# Patient Record
Sex: Male | Born: 1960 | Race: White | Hispanic: No | Marital: Married | Smoking: Former smoker
Health system: Southern US, Community
[De-identification: ages and names within clinical notes are randomized; demographics above are authoritative.]

## PROBLEM LIST (undated history)

## (undated) DIAGNOSIS — M109 Gout, unspecified: Secondary | ICD-10-CM

## (undated) DIAGNOSIS — G4730 Sleep apnea, unspecified: Secondary | ICD-10-CM

## (undated) DIAGNOSIS — E669 Obesity, unspecified: Secondary | ICD-10-CM

## (undated) DIAGNOSIS — I484 Atypical atrial flutter: Secondary | ICD-10-CM

## (undated) DIAGNOSIS — I10 Essential (primary) hypertension: Secondary | ICD-10-CM

## (undated) DIAGNOSIS — K219 Gastro-esophageal reflux disease without esophagitis: Secondary | ICD-10-CM

## (undated) HISTORY — DX: Essential (primary) hypertension: I10

## (undated) HISTORY — PX: CHOLECYSTECTOMY: SHX55

## (undated) HISTORY — DX: Gastro-esophageal reflux disease without esophagitis: K21.9

## (undated) HISTORY — DX: Obesity, unspecified: E66.9

## (undated) HISTORY — DX: Atypical atrial flutter: I48.4

---

## 1999-05-08 ENCOUNTER — Emergency Department (HOSPITAL_COMMUNITY): Admission: EM | Admit: 1999-05-08 | Discharge: 1999-05-08 | Payer: Self-pay | Admitting: Emergency Medicine

## 2006-12-21 ENCOUNTER — Ambulatory Visit (HOSPITAL_COMMUNITY): Admission: RE | Admit: 2006-12-21 | Discharge: 2006-12-21 | Payer: Self-pay | Admitting: Pulmonary Disease

## 2007-07-04 ENCOUNTER — Encounter (INDEPENDENT_AMBULATORY_CARE_PROVIDER_SITE_OTHER): Payer: Self-pay | Admitting: *Deleted

## 2007-07-04 ENCOUNTER — Ambulatory Visit (HOSPITAL_COMMUNITY): Admission: RE | Admit: 2007-07-04 | Discharge: 2007-07-04 | Payer: Self-pay | Admitting: Surgery

## 2007-07-04 LAB — CONVERTED CEMR LAB
ALT: 29 units/L
AST: 16 units/L
Albumin: 4.2 g/dL
Alkaline Phosphatase: 52 units/L
BUN: 17 mg/dL
CO2: 25 meq/L
Calcium: 8.8 mg/dL
Chloride: 103 meq/L
Cholesterol: 184 mg/dL
Creatinine, Ser: 1.04 mg/dL
Free T4: 9.4 ng/dL
Glucose, Bld: 102 mg/dL
HDL: 35 mg/dL
LDL Cholesterol: 120 mg/dL
Potassium: 4.6 meq/L
Sodium: 140 meq/L
T3, Free: 146.2 pg/mL
TSH: 2.617 microintl units/mL
Total Protein: 6.9 g/dL
Triglycerides: 146 mg/dL

## 2007-07-12 ENCOUNTER — Encounter: Admission: RE | Admit: 2007-07-12 | Discharge: 2007-07-12 | Payer: Self-pay | Admitting: Surgery

## 2007-07-27 ENCOUNTER — Ambulatory Visit: Admission: RE | Admit: 2007-07-27 | Discharge: 2007-07-27 | Payer: Self-pay | Admitting: Surgery

## 2007-08-09 HISTORY — PX: LAPAROSCOPIC GASTRIC BANDING: SHX1100

## 2007-08-16 ENCOUNTER — Ambulatory Visit: Payer: Self-pay | Admitting: Pulmonary Disease

## 2007-09-26 ENCOUNTER — Encounter: Admission: RE | Admit: 2007-09-26 | Discharge: 2007-12-25 | Payer: Self-pay | Admitting: Surgery

## 2007-10-15 ENCOUNTER — Ambulatory Visit (HOSPITAL_COMMUNITY): Admission: RE | Admit: 2007-10-15 | Discharge: 2007-10-16 | Payer: Self-pay | Admitting: Surgery

## 2009-10-12 ENCOUNTER — Encounter: Payer: Self-pay | Admitting: Cardiology

## 2009-10-13 DIAGNOSIS — E663 Overweight: Secondary | ICD-10-CM | POA: Insufficient documentation

## 2009-10-13 DIAGNOSIS — I10 Essential (primary) hypertension: Secondary | ICD-10-CM | POA: Insufficient documentation

## 2009-10-15 ENCOUNTER — Encounter (INDEPENDENT_AMBULATORY_CARE_PROVIDER_SITE_OTHER): Payer: Self-pay | Admitting: *Deleted

## 2009-10-16 ENCOUNTER — Encounter (INDEPENDENT_AMBULATORY_CARE_PROVIDER_SITE_OTHER): Payer: Self-pay | Admitting: *Deleted

## 2009-10-16 ENCOUNTER — Ambulatory Visit: Payer: Self-pay | Admitting: Cardiology

## 2009-10-16 ENCOUNTER — Encounter: Payer: Self-pay | Admitting: Adult Health

## 2009-10-16 DIAGNOSIS — R079 Chest pain, unspecified: Secondary | ICD-10-CM | POA: Insufficient documentation

## 2009-10-16 DIAGNOSIS — E785 Hyperlipidemia, unspecified: Secondary | ICD-10-CM | POA: Insufficient documentation

## 2009-10-19 ENCOUNTER — Encounter: Payer: Self-pay | Admitting: Cardiology

## 2009-10-21 ENCOUNTER — Ambulatory Visit: Payer: Self-pay | Admitting: Cardiology

## 2009-10-21 ENCOUNTER — Encounter (HOSPITAL_COMMUNITY): Admission: RE | Admit: 2009-10-21 | Discharge: 2009-11-20 | Payer: Self-pay | Admitting: Cardiology

## 2009-10-21 ENCOUNTER — Encounter: Payer: Self-pay | Admitting: Cardiology

## 2009-10-26 ENCOUNTER — Ambulatory Visit: Payer: Self-pay | Admitting: Cardiology

## 2009-10-27 ENCOUNTER — Encounter: Payer: Self-pay | Admitting: Cardiology

## 2010-09-09 NOTE — Miscellaneous (Signed)
Summary: labs cmp,lipids,t4,t3,tsh,07/04/2007  Clinical Lists Changes  Observations: Added new observation of CALCIUM: 8.8 mg/dL (16/05/9603 54:09) Added new observation of ALBUMIN: 4.2 g/dL (81/19/1478 29:56) Added new observation of PROTEIN, TOT: 6.9 g/dL (21/30/8657 84:69) Added new observation of SGPT (ALT): 29 units/L (07/04/2007 15:41) Added new observation of SGOT (AST): 16 units/L (07/04/2007 15:41) Added new observation of ALK PHOS: 52 units/L (07/04/2007 15:41) Added new observation of CREATININE: 1.04 mg/dL (62/95/2841 32:44) Added new observation of BUN: 17 mg/dL (08/10/7251 66:44) Added new observation of BG RANDOM: 102 mg/dL (03/47/4259 56:38) Added new observation of CO2 PLSM/SER: 25 meq/L (07/04/2007 15:41) Added new observation of CL SERUM: 103 meq/L (07/04/2007 15:41) Added new observation of K SERUM: 4.6 meq/L (07/04/2007 15:41) Added new observation of NA: 140 meq/L (07/04/2007 15:41) Added new observation of LDL: 120 mg/dL (75/64/3329 51:88) Added new observation of HDL: 35 mg/dL (41/66/0630 16:01) Added new observation of TRIGLYC TOT: 146 mg/dL (09/32/3557 32:20) Added new observation of CHOLESTEROL: 184 mg/dL (25/42/7062 37:62) Added new observation of TSH: 2.617 microintl units/mL (07/04/2007 15:41) Added new observation of T4, FREE: 9.4 ng/dL (83/15/1761 60:73) Added new observation of T3 FREE: 146.2 pg/mL (07/04/2007 15:41)

## 2010-09-09 NOTE — Letter (Signed)
Summary: [place] Results Engineer, agricultural at [facility]  [address] [place] [address]   Phone: [phone]  Fax: [phone]      October 27, 2009 MRN: [id number]   Patrick Chapman [address] [place], [place]  [address]   Dear Mr. Sowder,  Your test ordered by Selena Batten has been reviewed by your physician (or physician assistant) and was found to be normal or stable. Your physician (or physician assistant) felt no changes were needed at this time.  ____ Echocardiogram  __X__ Cardiac Stress Test  ____ Lab Work  ____ Peripheral vascular study of arms, legs or neck  ____ CT scan or X-ray  ____ Lung or Breathing test  ____ Other: Please continue on current medical treatment.   Thank you.   Valera Castle, MD, F.A.C.C

## 2010-09-09 NOTE — Letter (Signed)
Summary: office notes  office notes   Imported By: Faythe Ghee 10/12/2009 12:10:39  _____________________________________________________________________  External Attachment:    Type:   Image     Comment:   External Document

## 2010-09-09 NOTE — Letter (Signed)
Summary:  Treadmill (Nuc Med Stress)  [facility]  [address] [place] [address]   Phone: [phone]  Fax: [phone]    Nuclear Medicine 1-Day Stress Test Information Sheet  Re:     Patrick Chapman   DOB:     1960-10-12 MRN:     [id number] Weight:  Appointment Date: Register at: Appointment Time: Referring MD:  _X__Exercise Stress  __Adenosine   __Dobutamine  __Lexiscan  __Persantine   __Thallium  Urgency: ____1 (next day)   ____2 (one week)    ____3 (PRN)  Patient will receive Follow Up call with results: Patient needs follow-up appointment:  Instructions regarding medication:  How to prepare for your stress test: 1. DO NOT eat or dring 6 hours prior to your arrival time. This includes no caffeine (coffee, tea, sodas, chocolate) if you were instructed to take your medications, drink water with it. 2. DO NOT use any tobacco products for at leaset 8 hours prior to arrival. 3. DO NOT wear dresses or any clothing that may have metal clasps or buttons. 4. Wear short sleeve shirts, loose clothing, and comfortalbe walking shoes. 5. DO NOT use lotions, oils or powder on your chest before the test. 6. The test will take approximately 3-4 hours from the time you arrive until completion. 7. To register the day of the test, go to the Short Stay entrance at [facility]. 8. If you must cancel your test, call [phone] as soon as you are aware.  After you arrive for test:   When you arrive at [facility], you will go to Short Stay to be registered. They will then send you to Radiology to check in. The Nuclear Medicine Tech will get you and start an IV in your arm or hand. A small amount of a radioactive tracer will then be injected into your IV. This tracer will then have to circulate for 30-45 minutes. During this time you will wait in the waiting room and you will be able to drink something without caffeine. A series of pictures will be taken  of your heart follwoing this waiting period. After the 1st set of pictures you will go to the stress lab to get ready for your stress test. During the stress test, another small amount of a radioactive tracer will be injected through your IV. When the stress test is complete, there is a short rest period while your heart rate and blood pressure will be monitored. When this monitoring period is complete you will have another set of pictrues taken. (The same as the 1st set of pictures). These pictures are taken between 15 minutes and 1 hour after the stress test. The time depends on the type of stress test you had. Your doctor will inform you of your test results within 7 days after test.    The possibilities of certain changes are possible during the test. They include abnormal blood pressure and disorders of the heart. Side effects of persantine or adenosine can include flushing, chest pain, shortness of breath, stomach tightness, headache and light-headedness. These side effects usually do not last long and are self-resolving. Every effort will be made to keep you comfortable and to minimize complications by obtaining a medical history and by close observation during the test. Emergency equipment, medications, and trained personnel are available to deal with any unusual situation which may arise.  Please notify office at least 48 hours in advance if you are unable to keep  this appt.

## 2010-09-09 NOTE — Letter (Signed)
Summary: EKG  EKG   Imported By: Faythe Ghee 10/19/2009 13:20:30  _____________________________________________________________________  External Attachment:    Type:   Image     Comment:   External Document

## 2010-09-09 NOTE — Assessment & Plan Note (Signed)
Summary: f/u myoview to be done 3/16/tg   Visit Type:  Follow-up Primary Provider:  Nehemiah Settle  CC:  No Cardiology Complaints today.  History of Present Illness: Patrick Chapman is a 50 y/o obese CM with known history of hypertension, hypercholesterolemia, lap-band implantation 2009 with 95lb weight loss, with complaints of chest discomfort.  On last visit he was sent for evaluation with a stress nuclear studybecause of these symptoms described as heartburn and pressure. The results of the stress test, completed 10/21/2009 revealed negative with impaired exercise capacity, normal LV size and function with normal myocardial perfusion without convincing evidence for ischemia or infarction.  The patient states that the symptoms are improved since being seen last.  He states that his lap band was recently tightened and he thinks that this is the source of his heartburn and pressure.  He says that the last time the lap band was tightened, he had the same symptoms, but forgot about them until now.  Current Medications (verified): 1)  Azor 5-40 Mg Tabs (Amlodipine-Olmesartan) .... Take 1 Tab Daily 2)  Aspir-Low 81 Mg Tbec (Aspirin) .... Take 1 Tab Daily 3)  Simvastatin 40 Mg Tabs (Simvastatin) .... Take One Tablet By Mouth Daily At Bedtime  Allergies (verified): 1)  ! Codeine  Review of Systems       All other systems have been reviewed and are negative unless stated above.   Vital Signs:  Patient profile:   50 year old male Weight:      301 pounds Pulse rate:   72 / minute BP sitting:   110 / 75  (right arm)  Vitals Entered By: Dreama Saa, CNA (October 26, 2009 3:20 PM) 01  Physical Exam  General:  Well developed, well nourished, in no acute distress. Lungs:  Clear bilaterally to auscultation and percussion. Heart:  Non-displaced PMI, chest non-tender; regular rate and rhythm, S1, S2 without murmurs, rubs or gallops. Carotid upstroke normal, no bruit. Normal abdominal aortic  size, no bruits. Femorals normal pulses, no bruits. Pedals normal pulses. No edema, no varicosities.   Impression & Recommendations:  Problem # 1:  CHEST PAIN UNSPECIFIED (ICD-786.50) Stress test is reassuring and seemed to relieve the patient of concerns about cardiac etiology of pain. It allowed him to remember about the symptoms that he experienced the last time his lap band was tightened.  I have encouraged him to continued his weight loss regimine.  Also to increase his exercise to help to develop more stamina.  We will see him on a as needed basis. His updated medication list for this problem includes:    Azor 5-40 Mg Tabs (Amlodipine-olmesartan) .Marland Kitchen... Take 1 tab daily    Aspir-low 81 Mg Tbec (Aspirin) .Marland Kitchen... Take 1 tab daily  Patient Instructions: 1)  Your physician recommends that you schedule a follow-up appointment in: as needed  2)  Your physician recommends that you continue on your current medications as directed. Please refer to the Current Medication list given to you today.

## 2010-09-09 NOTE — Assessment & Plan Note (Signed)
Summary: **NP3 CHEST PAIN   Visit Type:  Initial Consult Primary Provider:  Nehemiah Settle  CC:  chest pain.  History of Present Illness: Patrick Chapman is a 50 y/o obese CM with known history of hypertension, lap-band implantation in 2009, with 90 lb weight loss, palpatations since childhood. Who has been experiening "heart burn" and sharp left sided chest pain for about 6wks to 2 months without exertion.  He had been taking Nexium before lap band procedure, but since that time had no recurrence of heartburn until recently.  He was seen by his primary care physician Dr. Juanetta Gosling and after reporting these symptoms, he was referred to see cardiology. He denies SOB, diaphoresis, NV, syncope  with heartburn or palplations.  Preventive Screening-Counseling & Management  Alcohol-Tobacco     Alcohol drinks/day: 0     Smoking Status: quit > 6 months     Smoking Cessation Counseling: no  Caffeine-Diet-Exercise     Does Patient Exercise: no     Exercise Counseling: Yes  Problems Prior to Update: 1)  Hypertension  (ICD-401.9) 2)  Overweight  (ICD-278.02)  Current Medications (verified): 1)  Azor 5-40 Mg Tabs (Amlodipine-Olmesartan) .... Take 1 Tab Daily 2)  Aspir-Low 81 Mg Tbec (Aspirin) .... Take 1 Tab Daily 3)  Simvastatin 40 Mg Tabs (Simvastatin) .... Take One Tablet By Mouth Daily At Bedtime  Allergies (verified): 1)  ! Codeine  Past History:  Past Surgical History: laparoscopic adjustable gastric banding 10/15/2007 cholecystectomy  Family History: Father: CABG age 57 Mother: CAD with 2 stents age 31's Siblings: Good health  Social History: Tobacco Use - No.  Alcohol Use - no Regular Exercise - no Drug Use - no Full Time IT consultant Married  Alcohol drinks/day:  0 Smoking Status:  quit > 6 months  Review of Systems       Heartburn, palpatations.  All other systems have been reviewed and are negative unless stated above.   Vital Signs:  Patient profile:    50 year old male Height:      72 inches Weight:      305 pounds BMI:     41.51 Pulse rate:   86 / minute BP sitting:   127 / 84  (right arm)  Vitals Entered By: Dreama Saa, CNA (October 16, 2009 1:31 PM)  Physical Exam  General:  Well developed, well nourished, in no acute distress. Head:  normocephalic and atraumatic Eyes:  PERRLA/EOM intact; conjunctiva and lids normal. Ears:  TM's intact and clear with normal canals and hearing Nose:  no deformity, discharge, inflammation, or lesions Mouth:  Teeth, gums and palate normal. Oral mucosa normal. Neck:  obese Lungs:  Clear bilaterally to auscultation and percussion. Heart:  Non-displaced PMI, chest non-tender; regular rate and rhythm, S1, S2 without murmurs, rubs or gallops. Carotid upstroke normal, no bruit. Normal abdominal aortic size, no bruits. Femorals normal pulses, no bruits. Pedals normal pulses. No edema, no varicosities. Abdomen:  Obese 2+ bowel sounds Msk:  Back normal, normal gait. Muscle strength and tone normal. Extremities:  No clubbing or cyanosis. Neurologic:  Alert and oriented x 3. Psych:  Normal affect.   Impression & Recommendations:  Problem # 1:  HYPERLIPIDEMIA (ICD-272.4) Review of his recent lipid profile.  TC 200;HDL 37; LDL 142;  TG 151  We will start simvistatin 40mg  daily.  He is encouraged to walk or exercise daily and to continue his weight loss regimine. His updated medication list for this problem includes:  Simvastatin 40 Mg Tabs (Simvastatin) .Marland Kitchen... Take one tablet by mouth daily at bedtime  Future Orders: T-Lipid Profile ([id number]) ... 11/27/2009 T-Renal Function Panel [id number]) ... 11/27/2009  Problem # 2:  CHEST PAIN UNSPECIFIED (ICD-786.50) Will schedule tests below.  He has CVRF for CAD, wt, HTN, Cholesterol, and FH.  This will assess for diagnostic and prognostic evaluation of heart disease. He has also been seen by Dr. Juanito Doom for introduction and discussion of risk factors and  need for further testing. His updated medication list for this problem includes:    Azor 5-40 Mg Tabs (Amlodipine-olmesartan) .Marland Kitchen... Take 1 tab daily    Aspir-low 81 Mg Tbec (Aspirin) .Marland Kitchen... Take 1 tab daily  Orders: Nuclear Stress Test (Nuc Stress Test) 2-D Echocardiogram (2D Echo)  Problem # 3:  HYPERTENSION (ICD-401.9) Assessment: Unchanged  His updated medication list for this problem includes:    Azor 5-40 Mg Tabs (Amlodipine-olmesartan) .Marland Kitchen... Take 1 tab daily    Aspir-low 81 Mg Tbec (Aspirin) .Marland Kitchen... Take 1 tab daily  Future Orders: T-Lipid Profile ([id number]) ... 11/27/2009 T-Renal Function Panel [id number]) ... 11/27/2009  Patient Instructions: 1)  Your physician recommends that you schedule a follow-up appointment in: after testing 2)  Your physician recommends that you return for lab work in: 6 weeks 3)  Your physician has requested that you have an echocardiogram.  Echocardiography is a painless test that uses sound waves to create images of your heart. It provides your doctor with information about the size and shape of your heart and how well your heart's chambers and valves are working.  This procedure takes approximately one hour. There are no restrictions for this procedure. 4)  Your physician has requested that you have an exercise stress myoview.  For further information please visit [email or web address]  Please follow instruction sheet, as given. 5)  Your physician has recommended you make the following change in your medication: START SIMVASTATIN 40MG  DAILY Prescriptions: SIMVASTATIN 40 MG TABS (SIMVASTATIN) Take one tablet by mouth daily at bedtime  #30 x 6   Entered by:   Teressa Lower RN   Authorized by:   Joni Reining, NP   Signed by:   Teressa Lower RN on 10/16/2009   Method used:   Electronically to        [facility] (retail)       [address]       [place], [place]  [address]       Ph: [phone] or [phone]        Fax: [phone]   RxID:   [id number]

## 2010-09-09 NOTE — Letter (Signed)
Summary: labs  labs   Imported By: Faythe Ghee 10/19/2009 13:42:57  _____________________________________________________________________  External Attachment:    Type:   Image     Comment:   External Document

## 2010-09-09 NOTE — Letter (Signed)
Summary: [place] [employer] [facility] Work Engineer, agricultural at [facility]  [address] [place] [address]   Phone: [phone]  Fax: [phone]     October 16, 2009 MRN: [id number]   [facility] [address] [place], [place]  [address]      YOUR LAB WORK IS DUE   _______APRIL 22, 2011__________________________________  Please go to [facility], located across the street from [facility] on the second floor.  Hours are Monday - Friday 7am until 7:30pm         Saturday 8am until 12noon    _X_  DO NOT EAT OR DRINK AFTER MIDNIGHT EVENING PRIOR TO LABWORK  __ YOUR LABWORK IS NOT FASTING --YOU MAY EAT PRIOR TO LABWORK

## 2010-09-09 NOTE — Letter (Signed)
Summary: PROGRESS NOTES  PROGRESS NOTES   Imported By: Faythe Ghee 10/19/2009 13:21:06  _____________________________________________________________________  External Attachment:    Type:   Image     Comment:   External Document

## 2010-09-10 NOTE — Letter (Signed)
Summary: ekg  ekg   Imported By: Faythe Ghee 10/12/2009 12:11:03  _____________________________________________________________________  External Attachment:    Type:   Image     Comment:   External Document

## 2010-11-19 ENCOUNTER — Emergency Department (HOSPITAL_COMMUNITY): Payer: BC Managed Care – PPO

## 2010-11-19 ENCOUNTER — Emergency Department (HOSPITAL_COMMUNITY)
Admission: EM | Admit: 2010-11-19 | Discharge: 2010-11-20 | Disposition: A | Discharge status: Home / Self Care | Payer: BC Managed Care – PPO | Attending: Emergency Medicine | Admitting: Emergency Medicine

## 2010-11-19 DIAGNOSIS — R110 Nausea: Secondary | ICD-10-CM | POA: Insufficient documentation

## 2010-11-19 DIAGNOSIS — Z79899 Other long term (current) drug therapy: Secondary | ICD-10-CM | POA: Insufficient documentation

## 2010-11-19 DIAGNOSIS — R079 Chest pain, unspecified: Secondary | ICD-10-CM | POA: Insufficient documentation

## 2010-11-19 DIAGNOSIS — R0602 Shortness of breath: Secondary | ICD-10-CM | POA: Insufficient documentation

## 2010-11-19 DIAGNOSIS — Z9889 Other specified postprocedural states: Secondary | ICD-10-CM | POA: Insufficient documentation

## 2010-11-19 DIAGNOSIS — I10 Essential (primary) hypertension: Secondary | ICD-10-CM | POA: Insufficient documentation

## 2010-11-19 DIAGNOSIS — E669 Obesity, unspecified: Secondary | ICD-10-CM | POA: Insufficient documentation

## 2010-11-19 DIAGNOSIS — R0989 Other specified symptoms and signs involving the circulatory and respiratory systems: Secondary | ICD-10-CM | POA: Insufficient documentation

## 2010-11-19 DIAGNOSIS — R0609 Other forms of dyspnea: Secondary | ICD-10-CM | POA: Insufficient documentation

## 2010-11-19 LAB — CBC
HCT: 46.1 % (ref 39.0–52.0)
Hemoglobin: 15.3 g/dL (ref 13.0–17.0)
MCH: 31.2 pg (ref 26.0–34.0)
MCHC: 33.2 g/dL (ref 30.0–36.0)
MCV: 93.9 fL (ref 78.0–100.0)
Platelets: 201 10*3/uL (ref 150–400)
RBC: 4.91 MIL/uL (ref 4.22–5.81)
RDW: 13.4 % (ref 11.5–15.5)
WBC: 8.9 10*3/uL (ref 4.0–10.5)

## 2010-11-19 LAB — COMPREHENSIVE METABOLIC PANEL
ALT: 19 U/L (ref 0–53)
AST: 17 U/L (ref 0–37)
Albumin: 4.2 g/dL (ref 3.5–5.2)
Alkaline Phosphatase: 49 U/L (ref 39–117)
BUN: 13 mg/dL (ref 6–23)
CO2: 28 mEq/L (ref 19–32)
Calcium: 9.6 mg/dL (ref 8.4–10.5)
Chloride: 104 mEq/L (ref 96–112)
Creatinine, Ser: 1.29 mg/dL (ref 0.4–1.5)
GFR calc Af Amer: >60 mL/min (ref >60)
GFR calc non Af Amer: 59 mL/min — ABNORMAL LOW (ref >60)
Glucose, Bld: 106 mg/dL — ABNORMAL HIGH (ref 70–99)
Potassium: 4.5 mEq/L (ref 3.5–5.1)
Sodium: 139 mEq/L (ref 135–145)
Total Bilirubin: 0.8 mg/dL (ref 0.3–1.2)
Total Protein: 7.1 g/dL (ref 6.0–8.3)

## 2010-11-19 LAB — DIFFERENTIAL
Basophils Absolute: 0 10*3/uL (ref 0.0–0.1)
Basophils Relative: 0 % (ref 0–1)
Eosinophils Absolute: 0.1 10*3/uL (ref 0.0–0.7)
Eosinophils Relative: 1 % (ref 0–5)
Lymphocytes Relative: 23 % (ref 12–46)
Lymphs Abs: 2.1 10*3/uL (ref 0.7–4.0)
Monocytes Absolute: 0.8 10*3/uL (ref 0.1–1.0)
Monocytes Relative: 9 % (ref 3–12)
Neutro Abs: 6 10*3/uL (ref 1.7–7.7)
Neutrophils Relative %: 67 % (ref 43–77)

## 2010-11-19 LAB — LIPASE, BLOOD: Lipase: 30 U/L (ref 11–59)

## 2010-11-19 LAB — POCT CARDIAC MARKERS
CKMB, poc: <1 ng/mL — ABNORMAL LOW (ref 1.0–8.0)
CKMB, poc: <1 ng/mL — ABNORMAL LOW (ref 1.0–8.0)
Myoglobin, poc: 41.9 ng/mL (ref 12–200)
Myoglobin, poc: 44.4 ng/mL (ref 12–200)
Troponin i, poc: <0.05 ng/mL (ref 0.00–0.09)
Troponin i, poc: <0.05 ng/mL (ref 0.00–0.09)

## 2010-11-25 ENCOUNTER — Encounter: Payer: Self-pay | Admitting: Gastroenterology

## 2010-11-25 ENCOUNTER — Ambulatory Visit (INDEPENDENT_AMBULATORY_CARE_PROVIDER_SITE_OTHER): Payer: BC Managed Care – PPO | Admitting: Gastroenterology

## 2010-11-25 ENCOUNTER — Other Ambulatory Visit: Payer: Self-pay | Admitting: Internal Medicine

## 2010-11-25 DIAGNOSIS — R1013 Epigastric pain: Secondary | ICD-10-CM | POA: Insufficient documentation

## 2010-11-25 DIAGNOSIS — K219 Gastro-esophageal reflux disease without esophagitis: Secondary | ICD-10-CM | POA: Insufficient documentation

## 2010-11-25 DIAGNOSIS — R0789 Other chest pain: Secondary | ICD-10-CM

## 2010-11-25 NOTE — Progress Notes (Signed)
Referring Provider: Fredirick Maudlin, MD Primary Care Physician:  Patrick Maudlin, MD Primary Gastroenterologist:  Dr. Jena Chapman   Chief Complaint  Patient presents with  . Gastrophageal Reflux    possible, and lap band checked, frequent chest pain    HPI:  Patrick Chapman is a 50 y.o. male here as a referral from Dr. Juanetta Chapman for reflux exacerbation and left-sided chest discomfort, hx of band placement. Patrick Chapman reports undergoing a laparoscopic adjustable gastric band, AP large in March 2009 by Dr. Daphine Chapman in [place]. His pre-operative wt was 370. He is now down to 304. His estimated max fill is 9cc; he is unsure exactly how much is in his band curently. He does report that 1cc was removed last week by Dr. Daphine Chapman due to increased reflux. This has not provided any relief. Pt reports last fill prior to this removal was approximately 1 year ago, 0.25cc. He had done well up until a few months ago. Distinctly remembers eating a spicy chicken sandwich from [employer], whereby he then experienced severe left-sided chest pains. He has had a cardiac work-up in the past. He continues to experience intermittent reflux, stabbing/sharp left-sided chest discomfort, and LUQ pain with severe episodes. This is not always related to food intake. Coffee worsens the discomfort. Nexium has relieved this discomfort slightly. He has upped the nexium to BID for the past 4 days. He continues to experience these symptoms despite BID PPI and removal of 1cc of fluid last week. Occasional nausea, no fever or chills. As of note, he has gained approximately 15 lbs since Thanksgiving due to eating habits. He does have a hx of reflux prior to lap band placement; however, he had resolution of reflux post-surgery for approximately 2+ years.  He has not had any radiologic procedures such as an UGI/Ba swallow; no prior endoscopy.    Past Medical History  Diagnosis Date  . GERD (gastroesophageal reflux disease)   . Hypertension      Past Surgical History  Procedure Date  . Laparoscopic gastric banding 2009    Dr. Daphine Chapman  . Cholecystectomy     Current Outpatient Prescriptions  Medication Sig Dispense Refill  . AZOR 5-40 MG per tablet Take 1 tablet by mouth daily.      Marland Kitchen NEXIUM 40 MG capsule Take 1 tablet by mouth Twice daily.        Allergies as of 11/25/2010 - Review Complete 11/25/2010  Allergen Reaction Noted  . Codeine  10/16/2009    Family History  Problem Relation Age of Onset  . Heart attack Mother     living  . Heart attack Father     living  . Diabetes Father     History   Social History  . Marital Status: Married    Spouse Name: N/A    Number of Children: N/A  . Years of Education: N/A   Social History Main Topics  . Smoking status: Never Smoker   . Smokeless tobacco: Current User    Types: Chew  . Alcohol Use: Yes     occassional  . Drug Use: No  . Sexually Active: Yes -- Male partner(s)    Birth Control/ Protection: Surgical     spouse    Review of Systems: Gen: Denies any fever, chills, sweats, anorexia, fatigue, weakness, malaise, weight loss, and sleep disorder CV: Denies chest pain, angina, palpitations, syncope, orthopnea, PND, peripheral edema, and claudication. Resp: Denies dyspnea at rest, dyspnea with exercise, cough, sputum, wheezing, coughing up blood,  and pleurisy. GI: See HPI GU : Denies urinary burning, blood in urine, urinary frequency, urinary hesitancy, nocturnal urination, and urinary incontinence. MS: Denies joint pain, limitation of movement, and swelling, stiffness, low back pain, extremity pain. Denies muscle weakness, cramps, atrophy.  Derm: Denies rash, itching, dry skin, hives, moles, warts, or unhealing ulcers.  Psych: Denies depression, anxiety, memory loss, suicidal ideation, hallucinations, paranoia, and confusion. Heme: Denies bruising, bleeding, and enlarged lymph nodes.  Physical Exam: BP 123/81  Pulse 78  Temp 97.9 F (36.6 C)  Ht  6' (1.829 m)  Wt 304 lb 3.2 oz (137.984 kg)  BMI 41.26 kg/m2 General:   Alert,  Well-developed, well-nourished, pleasant and cooperative in NAD Head:  Normocephalic and atraumatic. Eyes:  Sclera clear, no icterus.   Conjunctiva pink. Ears:  Normal auditory acuity. Nose:  No deformity, discharge,  or lesions. Mouth:  No deformity or lesions, dentition normal. Neck:  Supple; no masses or thyromegaly. Lungs:  Clear throughout to auscultation.   No wheezes, crackles, or rhonchi. No acute distress. Heart:  Regular rate and rhythm; no murmurs, clicks, rubs,  or gallops. Abdomen:  Soft, nontender and nondistended. No masses, hepatosplenomegaly or hernias noted. Normal bowel sounds, without guarding, and without rebound.  Port easily palpable in LLQ Msk:  Symmetrical without gross deformities. Normal posture. Extremities:  Without clubbing or edema. Neurologic:  Alert and  oriented x4;  grossly normal neurologically. Skin:  Intact without significant lesions or rashes. Psych:  Alert and cooperative. Normal mood and affect.

## 2010-11-25 NOTE — Assessment & Plan Note (Signed)
See GERD 

## 2010-11-25 NOTE — Progress Notes (Signed)
Cc to PCP 

## 2010-11-25 NOTE — Patient Instructions (Signed)
Switch to Dexilant instead of Nexium. Take this once a day in the morning. Make sure you activate the savings card first.  We have set you up for an Upper GI swallow. This will assess your esophagus, position of band, and stomach. We will contact you with those results when received.  Our next step would be to pursue a possible endoscopy. This will be discussed after review of the Upper GI.   Continue to eat small portions, chewing well, eating slowly. Further recommendations to follow.

## 2010-11-25 NOTE — Assessment & Plan Note (Signed)
50 year old male with hx of reflux in past, resolved after lap band surgery in 2009, now with recurrence in the setting of slight weight gain over past few months. However, pt's last lap band fill was approximately one year ago, and he had done well with this until a few months ago. Despite 1 cc aspiration one week ago, pt continues to experience reflux exacerbations and left-sided chest discomfort. This is concerning for possible band slippage due to acute onset. Other differentials include overfilled band, increased weight contributing to overall picture. Left-sided chest discomfort and LUQ pain concerning for slippage, erosion, possible gastritis/PUD. Doubt this is a true picture of band slippage or erosion, but unable to be ruled out at this time without further investigation.  Will proceed with UGI/Ba swallow to assess band position Stop Nexium. Trial of Dexilant. Samples provided Will likely need upper endoscopy in the very near future. Will review results of UGI first.  Further recommendations to follow.

## 2010-12-01 ENCOUNTER — Ambulatory Visit (HOSPITAL_COMMUNITY)
Admission: RE | Admit: 2010-12-01 | Discharge: 2010-12-01 | Disposition: A | Discharge status: Home / Self Care | Payer: BC Managed Care – PPO | Source: Ambulatory Visit | Attending: Internal Medicine | Admitting: Internal Medicine

## 2010-12-01 DIAGNOSIS — K219 Gastro-esophageal reflux disease without esophagitis: Secondary | ICD-10-CM

## 2010-12-01 DIAGNOSIS — Z9884 Bariatric surgery status: Secondary | ICD-10-CM | POA: Insufficient documentation

## 2010-12-01 DIAGNOSIS — R0789 Other chest pain: Secondary | ICD-10-CM

## 2010-12-01 DIAGNOSIS — R079 Chest pain, unspecified: Secondary | ICD-10-CM | POA: Insufficient documentation

## 2010-12-03 ENCOUNTER — Telehealth: Payer: Self-pay | Admitting: Gastroenterology

## 2010-12-06 NOTE — Telephone Encounter (Signed)
May have Dexilant 60 mg po daily. Disp# 31 with 5 refills to pharmacy. Glad he is doing better. Let's see him back in 6 weeks.

## 2010-12-06 NOTE — Telephone Encounter (Signed)
rx called in to [facility] pharmacy. Please schedule follow up appt.

## 2010-12-07 NOTE — Telephone Encounter (Signed)
Pt is aware of OV on 01/12/11 at 3pm with AS

## 2010-12-08 ENCOUNTER — Encounter: Payer: Self-pay | Admitting: Gastroenterology

## 2010-12-21 NOTE — Procedures (Signed)
Patrick Chapman, Patrick Chapman NO.:  [id number]   MEDICAL RECORD NO.:  [id number]          PATIENT TYPE:  OUT   LOCATION:  SLEEP LAB                     FACILITY:  [facility]   PHYSICIAN:  Barbaraann Share, MD,FCCPDATE OF BIRTH:  1962-02-15   DATE OF STUDY:  07/27/2007                            NOCTURNAL POLYSOMNOGRAM   REFERRING PHYSICIAN:  Thornton Park. Daphine Deutscher, MD   INDICATION FOR STUDY:  Hypersomnia with sleep apnea.   Epworth score:  2.  Sleep architecture:  The patient had total sleep time of 286 minutes  with decreased slow-wave sleep as well as REM.  Sleep onset latency was  prolonged at 65 minutes, and REM onset was fairly normal at 92 minutes.  Sleep efficiency was decreased at 73%.  Respiratory data:  The patient  was found to have 3 obstructive apneas and 39 obstructive hypopneas for  an apnea/hypopnea index of 9 events per hour.  The events occurred  primarily during supine REM.  There was loud snoring noted throughout.  Oxygen data:  There was O2 desaturation as low as 73% with the patient's  obstructive events.  Cardiac:  Rare PVCs were noted but no clinically significant arrhythmia.  Movement/parasomnia:  None.   IMPRESSION/RECOMMENDATIONS:  Mild obstructive sleep apnea/hypopnea  syndrome with an apnea/hypopnea index of 9 events per hour and O2  desaturation as low as 73%.   TREATMENT:  treatment for this degree of sleep apnea can include weight  loss alone, if applicable, upper airway surgery, oral appliance, and  also CPAP.      Barbaraann Share, MD,FCCP  Diplomate, American Board of Sleep  Medicine  Electronically Signed     KMC/MEDQ  D:  08/16/2007 16:03:14  T:  08/17/2007 07:34:10  Job:  [id number]

## 2010-12-21 NOTE — Op Note (Signed)
NAMEKENTON, [name]                ACCOUNT NO.:  [id number]   MEDICAL RECORD NO.:  [id number]          PATIENT TYPE:  OIB   LOCATION:  1533                         FACILITY:  [facility]   PHYSICIAN:  Thornton Park. Daphine Deutscher, MD  DATE OF BIRTH:  1961/02/23   DATE OF PROCEDURE:  10/15/2007  DATE OF DISCHARGE:                               OPERATIVE REPORT   PREOPERATIVE DIAGNOSIS:  Morbid obesity with hypertension and a body  mass index of 47.   POSTOPERATIVE DIAGNOSIS:  Morbid obesity with hypertension and a body  mass index of 47.   PROCEDURE:  Laparoscopic adjustable gastric banding (Allergan) with APL  system.   SURGEON:  Thornton Park. Daphine Deutscher, M.D.   ASSISTANT:  Sandria Bales. Ezzard Standing, M.D.   ANESTHESIA:  General endotracheal.   DESCRIPTION OF PROCEDURE:  Patrick Chapman is a 50 year old white male who  was taken to room 1 on October 15, 2007 and given general anesthesia.  The  abdomen was prepped with Techni-Care and draped sterilely.   The abdomen was entered through the left upper quadrant using an  Optiview technique 0-degree scope, and once entered, the abdomen was  insufflated without difficulty.  Standard trocar placements were used,  including a 15 in the right upper position, using the band passer from  the lower position on the right.  I went ahead and put the [facility]  retractor up, retracting the liver and dissected the EG junction.  There  was a little bleeder along the gastrohepatic ligament that I had to clip  with two clips.  I went down to the fat stripe along the right crus and  used that as my marker to enter with the finger and passed it up to the  patient's left side without difficulty.  The APL band was inserted and  threaded through the band passer and brought around without difficulty  and snapped in place over the calibration tubing.  Prior to that time,  with the calibration and tubing in place, we inflated the balloon to 15  mL, pulled it back, and did not encounter  evidence of a hiatal hernia.   The anterior portion of the band was then plicated with 3 sutures using  free sutures of Surgitek and the tie knot.  Good band position was  noted.  The tubing was then brought out through the lower port on the  right, and the abdomen was deflated.  I then created a bigger port on  the right.  I did have some bleeding from the upper 15 trocar which I  cauterized and controlled and then fashioned the port site, exposing the  fascia and clearing that off.  Four sutures of 2-0 Prolene were then  placed through the fascia and then threaded onto the port which was  connected to the tubing, and then this was tied down.  I irrigated the  port well, removing all the excess fat and then closed each of the holes  with 4-0 Vicryl with Dermabond.   The patient tolerated the procedure well and was taken to the recovery  room in satisfactory condition.  Status post placement of APL system.      Thornton Park Daphine Deutscher, MD  Electronically Signed     MBM/MEDQ  D:  10/15/2007  T:  10/16/2007  Job:  [id number]   cc:   Ramon Dredge L. Juanetta Gosling, M.D.  Fax: [phone]

## 2011-01-12 ENCOUNTER — Encounter: Payer: Self-pay | Admitting: Gastroenterology

## 2011-01-12 ENCOUNTER — Ambulatory Visit (INDEPENDENT_AMBULATORY_CARE_PROVIDER_SITE_OTHER): Payer: BC Managed Care – PPO | Admitting: Gastroenterology

## 2011-01-12 VITALS — BP 135/83 | HR 76 | Temp 98.3°F | Ht 72.0 in | Wt 305.0 lb

## 2011-01-12 DIAGNOSIS — R1012 Left upper quadrant pain: Secondary | ICD-10-CM

## 2011-01-12 NOTE — Progress Notes (Signed)
Referring Provider: Fredirick Maudlin, MD Primary Care Physician:  Fredirick Maudlin, MD Primary Gastroenterologist: Dr. Jena Gauss   Chief Complaint  Patient presents with  . Follow-up    HPI:   [name] is a 50 year old obese Caucasian male who presents today in f/u for LUQ pain, left-sided chest discomfort, GERD. He is s/p lap band by Dr. Daphine Deutscher in [place] in 2009. According to pt, he ate large amounts of food prior to the operation, and after surgery he lost what he had gained; however, he remains pretty much at the same weight he has been for years. He initially was seen by myself for complaints of reflux, and left-sided chest discomfort. Dr. Daphine Deutscher had actually removed 1 cc of fluid from the band in early April, but the patient did not have resolution of symptoms. It should be noted that he has a hx of prior reflux before band placement, but it resolved with wt loss.   Now, with reflux exacerbation and left-sided chest discomfort, LUQ pain, I had been concerned about possible band slippage. An UGI was performed and reviewed by myself and the radiologist. Although the films were not ideal, the band appeared to be at the appropriate 45 degree angle, at the level of the GE junction, similar to prior studies.  He noticed some improvement with Dexilant that I prescribed at his last visit. Yet, he returns now with constant LUQ pain, radiating up to left chest, almost like a muscle burning. Feels slightly improved with eating, then returns 30 minutes later. He states red onions, ketchup, spicy foods exacerbated the pain. He doesn't necessarily complain of indigestion/heartburn/reflux.  He continues to take Dexilant daily, chewing Tums multiple times a day. Last endoscopy long time ago, pt unsure of date. He hasn't seen Dr. Daphine Deutscher since last cc was removed in early April. Portion sizes are not appropriate. Sometimes eats a regular plate size of food. Sometimes he admits to "stacking" food.   Past Medical  History  Diagnosis Date  . GERD (gastroesophageal reflux disease)   . Hypertension   . Obesity     Past Surgical History  Procedure Date  . Laparoscopic gastric banding 2009    Dr. Daphine Deutscher  . Cholecystectomy     Current Outpatient Prescriptions  Medication Sig Dispense Refill  . AZOR 5-40 MG per tablet Take 1 tablet by mouth daily.      Marland Kitchen DEXILANT 60 MG capsule 60 mg daily.       Marland Kitchen NEXIUM 40 MG capsule Take 1 tablet by mouth Twice daily.      . simvastatin (ZOCOR) 40 MG tablet Take 40 mg by mouth at bedtime.         Allergies as of 01/12/2011 - Review Complete 01/12/2011  Allergen Reaction Noted  . Codeine  10/16/2009    Family History  Problem Relation Age of Onset  . Heart attack Mother     living  . Heart attack Father     living  . Diabetes Father     History   Social History  . Marital Status: Married    Spouse Name: N/A    Number of Children: N/A  . Years of Education: N/A   Social History Main Topics  . Smoking status: Never Smoker   . Smokeless tobacco: Current User    Types: Chew  . Alcohol Use: Yes     occassional  . Drug Use: No  . Sexually Active: Yes -- Male partner(s)    Birth Control/ Protection:  Surgical     spouse     Review of Systems: Gen: Denies fever, chills, anorexia. Denies fatigue, weakness, weight loss.  CV: Denies chest pain, palpitations, syncope, peripheral edema, and claudication. Complains of left-sided chest discomfort that radiates from LUQ.  Resp: Denies dyspnea at rest, cough, wheezing, coughing up blood, and pleurisy. GI: Denies vomiting blood, jaundice, and fecal incontinence.   Denies dysphagia or odynophagia. Derm: Denies rash, itching, dry skin Psych: Denies depression, anxiety, memory loss, confusion. No homicidal or suicidal ideation.  Heme: Denies bruising, bleeding, and enlarged lymph nodes.  Physical Exam: BP 135/83  Pulse 76  Temp(Src) 98.3 F (36.8 C) (Temporal)  Ht 6' (1.829 m)  Wt 305 lb (138.347  kg)  BMI 41.37 kg/m2 General:   Alert and oriented. No distress noted. Pleasant and cooperative.  Head:  Normocephalic and atraumatic. Eyes:  Conjuctiva clear without scleral icterus. Mouth:  Oral mucosa pink and moist. Good dentition. No lesions. Neck:  Supple, without mass or thyromegaly. Heart:  S1, S2 present without murmurs, rubs, or gallops. Regular rate and rhythm. Abdomen:  Obese with large AP diameter. Surgical scars noted, band port easily palpable in middle left quadrant. +BS, soft, non-tender and non-distended. No rebound or guarding. No HSM or masses noted. Msk:  Symmetrical without gross deformities. Normal posture. Extremities:  Without edema. Neurologic:  Alert and  oriented x4;  grossly normal neurologically. Skin:  Intact without significant lesions or rashes. Cervical Nodes:  No significant cervical adenopathy. Psych:  Alert and cooperative. Normal mood and affect.

## 2011-01-12 NOTE — Patient Instructions (Signed)
We have set you up for an endoscopy.  Continue to take Dexilant daily.  We will contact your surgeon so they are aware of what is going on.  Further recommendations to follow.

## 2011-01-14 DIAGNOSIS — R1012 Left upper quadrant pain: Secondary | ICD-10-CM | POA: Insufficient documentation

## 2011-01-14 NOTE — Assessment & Plan Note (Signed)
50 year old Caucasian male with hx of band placement in 2009 by Dr. Daphine Deutscher with [facility] in [place]. Prior to operation had hx of reflux, yet resolved with wt loss. Now, has gradually noticed increasing reflux without improvement of removal of 1cc of fluid. He was started on Dexilant at last visit, as well as underwent an UGI to assess band placement. Dexilant appeared to alleviate symptoms for awhile, yet now reports LUQ pain radiating to left chest. Exacerbated by spicy foods, red onions. His symptoms are relieved by eating, yet return shortly thereafter. His UGI showed appropriate placement of band.  At this time, his continued LUQ pain despite PPI deserves assessment endoscopically to assess for gastritis, ulcer or even band erosion. I feel there is a component of non-compliance with dietary choices as well as portion sizes, which most definitely needs to be addressed.  I spoke in detail to pt about "stacking" food or "force-feeding", as this is not appropriate s/p band surgery. I briefly broached the subject of possible band removal, as he is not complying with dietary recommendations at this time or losing weight, as well as experiencing worsening of reflux. He is approaching the max capacity of his band, although this is completely at Dr. Ermalene Searing discretion.   With his co-morbidities, he may be better served with removal of band and conversion to gastric bypass. Again, this is at the surgeon's discretion, and he will be cc'ed on this note. The pt certainly wants to achieve relief, and although not impossible, he may not be able to lose any further weight without drastic dietary and behavior changes.   We will proceed with EGD with Dr. Jena Gauss in the near future. The R/B/A have been discussed in detail with the pt. He states understanding and agrees to proceed. Continue Dexilant Modify diet, avoid trigger foods Will contact Dr. Daphine Deutscher; pt may need additional fluid removal to see if this  helps alleviate some symptoms. I have strongly advised pt to return for f/u.

## 2011-01-17 NOTE — Progress Notes (Signed)
Cc to PCP 

## 2011-02-11 MED ORDER — SODIUM CHLORIDE 0.45 % IV SOLN
Freq: Once | INTRAVENOUS | Status: DC
  Filled 2011-02-11: qty 1000

## 2011-02-14 ENCOUNTER — Encounter (HOSPITAL_COMMUNITY): Admission: RE | Payer: Self-pay | Source: Ambulatory Visit

## 2011-02-14 ENCOUNTER — Encounter: Payer: BC Managed Care – PPO | Admitting: Internal Medicine

## 2011-02-14 ENCOUNTER — Ambulatory Visit (HOSPITAL_COMMUNITY)
Admission: RE | Admit: 2011-02-14 | Payer: BC Managed Care – PPO | Source: Ambulatory Visit | Admitting: Internal Medicine

## 2011-02-14 SURGERY — EGD (ESOPHAGOGASTRODUODENOSCOPY)
Anesthesia: Moderate Sedation

## 2011-02-22 NOTE — Telephone Encounter (Signed)
Opened in error

## 2011-02-25 ENCOUNTER — Encounter (HOSPITAL_COMMUNITY): Payer: Self-pay | Admitting: Internal Medicine

## 2011-05-02 LAB — CBC
HCT: 38.2 — ABNORMAL LOW
Hemoglobin: 13.2
MCHC: 34.5
MCV: 89.7
Platelets: 195
RBC: 4.25
RDW: 14.1
WBC: 6.7

## 2011-05-02 LAB — BASIC METABOLIC PANEL
BUN: 18
CO2: 27
Calcium: 9.3
Chloride: 99
Creatinine, Ser: 1.06
GFR calc Af Amer: >60
GFR calc non Af Amer: >60
Glucose, Bld: 89
Potassium: 4.2
Sodium: 136

## 2011-05-02 LAB — DIFFERENTIAL
Basophils Absolute: 0
Basophils Relative: 0
Eosinophils Absolute: 0
Eosinophils Relative: 1
Lymphocytes Relative: 32
Lymphs Abs: 2.1
Monocytes Absolute: 0.7
Monocytes Relative: 10
Neutro Abs: 3.8
Neutrophils Relative %: 57

## 2011-05-02 LAB — HEMOGLOBIN AND HEMATOCRIT, BLOOD
HCT: 43.5
Hemoglobin: 14.8

## 2011-06-15 MED ORDER — DEXLANSOPRAZOLE 60 MG PO CPDR: 60.0000 mg | DELAYED_RELEASE_CAPSULE | Freq: Every day | ORAL | Status: DC

## 2012-05-07 ENCOUNTER — Encounter: Payer: Self-pay | Admitting: Gastroenterology

## 2012-05-07 ENCOUNTER — Ambulatory Visit (INDEPENDENT_AMBULATORY_CARE_PROVIDER_SITE_OTHER): Payer: BC Managed Care – PPO | Admitting: Gastroenterology

## 2012-05-07 VITALS — BP 140/84 | HR 85 | Temp 98.4°F | Ht 72.0 in | Wt 317.2 lb

## 2012-05-07 DIAGNOSIS — K219 Gastro-esophageal reflux disease without esophagitis: Secondary | ICD-10-CM

## 2012-05-07 NOTE — Progress Notes (Signed)
Referring Provider: Fredirick Maudlin, MD Primary Care Physician:  Fredirick Maudlin, MD Primary Gastroenterologist: Dr. Jena Gauss   Chief Complaint  Patient presents with  . Gastrophageal Reflux    HPI:   51 year old pleasant male with hx of obesity, s/p lap band surgery in 2009 by Dr. Daphine Deutscher in [place]. I have seen this nice gentleman in the past, with last visit in June 2012. I had recommended an EGD at that time due to chest discomfort, reflux despite PPI. I was concerned about possible band slippage, erosion less likely. Barium swallow performed at that time showed good positioning of the band. He was set up for an EGD, but for unknown reasons this was not completed. I had spent some time with him discussing proper eating habits after the band surgery, and the risk of worsening reflux after band placement. He continues to gain weight. His highest weight prior to surgery was 370, with a low of 280. He is now up to 317.   States he started a new job March 2013 and began drinking massive amounts of tea and coffee to stay awake. Noted worsening chest pain, reflux. Started on Dexilant BID with improvement in symptoms. Symptoms have now resolved.   No nausea. No dysphagia. Does note occasional upper abdominal pain up through chest. Cardiac work up in past  negative. Been on BID for about 2 weeks now. Hasn't seen Dr. Daphine Deutscher recently. One month of symptoms. Not sure how much fluid is in band. Portion sizes about the same for several years.    Past Medical History  Diagnosis Date  . GERD (gastroesophageal reflux disease)   . Hypertension   . Obesity     Past Surgical History  Procedure Date  . Laparoscopic gastric banding 2009    Dr. Daphine Deutscher  . Cholecystectomy   . Esophagogastroduodenoscopy 02/14/2011    DID NOT COMPLETE, was scheduled but for unknown reasons was not undertaken    Current Outpatient Prescriptions  Medication Sig Dispense Refill  . AZOR 5-40 MG per tablet Take 1 tablet by  mouth daily.      Marland Kitchen dexlansoprazole (DEXILANT) 60 MG capsule Take 60 mg by mouth 2 (two) times daily.        Allergies as of 05/07/2012 - Review Complete 05/07/2012  Allergen Reaction Noted  . Codeine  10/16/2009    Family History  Problem Relation Age of Onset  . Heart attack Mother     living  . Heart attack Father     living  . Diabetes Father   . Colon cancer Neg Hx     History   Social History  . Marital Status: Married    Spouse Name: N/A    Number of Children: N/A  . Years of Education: N/A   Social History Main Topics  . Smoking status: Never Smoker   . Smokeless tobacco: Current User    Types: Chew  . Alcohol Use: Yes     occassional  . Drug Use: No  . Sexually Active: Yes -- Male partner(s)    Birth Control/ Protection: Surgical     spouse   Other Topics Concern  . None   Social History Narrative  . None    Review of Systems: Gen: Denies fever, chills, anorexia. Denies fatigue, weakness, weight loss.  CV: Denies chest pain, palpitations, syncope, peripheral edema, and claudication. Resp: Denies dyspnea at rest, cough, wheezing, coughing up blood, and pleurisy. GI: Denies vomiting blood, jaundice, and fecal incontinence.   Denies dysphagia or  odynophagia. Derm: Denies rash, itching, dry skin Psych: Denies depression, anxiety, memory loss, confusion. No homicidal or suicidal ideation.  Heme: Denies bruising, bleeding, and enlarged lymph nodes.  Physical Exam: BP 140/84  Pulse 85  Temp 98.4 F (36.9 C) (Temporal)  Ht 6' (1.829 m)  Wt 317 lb 3.2 oz (143.881 kg)  BMI 43.02 kg/m2 General:   Alert and oriented. No distress noted. Pleasant and cooperative.  Head:  Normocephalic and atraumatic. Eyes:  Conjuctiva clear without scleral icterus. Mouth:  Oral mucosa pink and moist. Good dentition. No lesions. Heart:  S1, S2 present without murmurs, rubs, or gallops. Regular rate and rhythm. Abdomen:  +BS, soft, non-tender and non-distended. No rebound  or guarding. No HSM or masses noted. Port easily palpable left-sided abdomen.  Msk:  Symmetrical without gross deformities. Normal posture. Extremities:  Without edema. Neurologic:  Alert and  oriented x4;  grossly normal neurologically. Skin:  Intact without significant lesions or rashes. Psych:  Alert and cooperative. Normal mood and affect.

## 2012-05-07 NOTE — Patient Instructions (Addendum)
Decrease Dexilant to once daily.   Call me in 1 week to let me know how you are doing. We may need to do an upper endoscopy.  You will need a colonoscopy at some point this year. If an upper endoscopy is needed, it can be done at the same time.  Next appointment to be determined after next week.

## 2012-05-09 ENCOUNTER — Encounter: Payer: Self-pay | Admitting: Gastroenterology

## 2012-05-09 NOTE — Assessment & Plan Note (Signed)
51 year old male with hx of lap band surgery in 2009, now with worsening of GERD symptoms. Please see the HPI for further details. Barium swallow last year showed adequate band placement. Has not followed up with Dr. Daphine Deutscher recently. Has recently been ingesting large amounts of tea, coffee to stay awake at new job. However, with cessation of this, as not mentioned above, symptoms have improved. He is on Dexilant BID. Continues to have intermittent upper abdominal discomfort, radiating up to chest. Negative cardiac work-up in past.  I continue to offer and recommend an upper endoscopy due to refractory reflux symptoms, upper abdominal discomfort and chest discomfort. Although the band procedure can make reflux worse, this would not entirely explain his symptoms. He has also gained a considerable amount of weight back, worsening his symptoms as well. Dexilant BID has worked well for him. Differentials at this point include band too tight, reflux secondary to behavior and weight gain, reflux symptoms as a result of the band itself, and more concerning possible band erosion.  I have asked him to decrease Dexilant to once a day, avoid reflux triggers, wt loss. He is to call me in 1 week. At this point, he is not thrilled about pursuing upper GI evaluation. I have discussed with him the importance of evaluation, and he would like to wait an additional week. At this time, he will decide on the EGD. At that time, I have offered him an initial, average risk screening colonoscopy. He turns 51 the middle of October.   Pt agreed and stated understanding. I have asked that he also follow-up with Dr. Daphine Deutscher in the near future.

## 2012-05-10 NOTE — Progress Notes (Signed)
Faxed to PCP

## 2012-05-14 ENCOUNTER — Telehealth: Payer: Self-pay | Admitting: *Deleted

## 2012-05-14 NOTE — Telephone Encounter (Signed)
Patrick Chapman called today on the request of [name]. He says that he is doing great, having no problems at all. He would like [name] to call him back, he does have a few questions for her. Thanks.

## 2012-05-14 NOTE — Telephone Encounter (Signed)
I called and spoke with Patrick Chapman. He said he is doing real good now. He said [name] had discussed him having a colonoscopy since he will turn 5 in October. He wants to wait til winter and do it. York Spaniel he will call when he is ready. I have put him on my reminder list.

## 2012-05-15 NOTE — Telephone Encounter (Signed)
Sounds great. Thanks

## 2012-06-04 ENCOUNTER — Telehealth: Payer: Self-pay

## 2012-06-04 NOTE — Telephone Encounter (Signed)
Pt called- he stated he has been having a lot of "indigestion" and abd pain. No N/V. Pt stated he is still taking dexilant qd but when he was taking it bid he wasn't having any problems at all. Informed him per AS ov note that she may want him to have egd and tcs done. Pt said he was fine with this. Do you want pt to come in for ov prior?   Also he is concerned about cost of procedures. He has to pay 20 percent of the procedure cost and wanted to know if we could give him an estimate on the cost of the procedures.

## 2012-06-05 NOTE — Telephone Encounter (Signed)
Dr. Jena Gauss, pts last ov appt was 05/07/12, can pt be triaged or do we need to bring him back in for ov?

## 2012-06-05 NOTE — Telephone Encounter (Signed)
Doris, please triage pt. Thanks.

## 2012-06-05 NOTE — Telephone Encounter (Signed)
You can triaged only if we get the procedure done by the end of next week (November 8); otherwise he needs an office visit.

## 2012-06-05 NOTE — Telephone Encounter (Signed)
If RMR is ok with just triaging, let's triage. He does need TCS/EGD.  Reason: refractory reflux despite daily PPI, hx of lap band, initial average risk screening colonoscopy.

## 2012-06-07 ENCOUNTER — Other Ambulatory Visit: Payer: Self-pay

## 2012-06-07 ENCOUNTER — Telehealth: Payer: Self-pay

## 2012-06-07 DIAGNOSIS — Z139 Encounter for screening, unspecified: Secondary | ICD-10-CM

## 2012-06-07 DIAGNOSIS — K219 Gastro-esophageal reflux disease without esophagitis: Secondary | ICD-10-CM

## 2012-06-07 MED ORDER — PEG-KCL-NACL-NASULF-NA ASC-C 100 G PO SOLR: 1.0000 | ORAL | Status: DC

## 2012-06-07 NOTE — Telephone Encounter (Signed)
See separate triage.  

## 2012-06-07 NOTE — Telephone Encounter (Signed)
  Gastroenterology Pre-Procedure Form \ OK TO TRIAGE PER RMR    Request Date: 06/07/2012    Requesting Physician: Dr. Jena Gauss     PATIENT INFORMATION:  Patrick Chapman is a 51 y.o., male (DOB=June 22, 1961).  PROCEDURE: Procedure(s) requested: colonoscopy Procedure Reason: screening for colon cancer/ EGD fir gerd and hx of lap band  PATIENT REVIEW QUESTIONS: The patient reports the following:   1. Diabetes Melitis: no 2. Joint replacements in the past 12 months: no 3. Major health problems in the past 3 months: no 4. Has an artificial valve or MVP:no 5. Has been advised in past to take antibiotics in advance of a procedure like teeth cleaning: no}    MEDICATIONS & ALLERGIES:    Patient reports the following regarding taking any blood thinners:   Plavix? no Aspirin?yes  Coumadin?  no  Patient confirms/reports the following medications:  Current Outpatient Prescriptions  Medication Sig Dispense Refill  . aspirin 81 MG tablet Take 81 mg by mouth daily.      . AZOR 5-40 MG per tablet Take 1 tablet by mouth daily.      Marland Kitchen dexlansoprazole (DEXILANT) 60 MG capsule Take 60 mg by mouth daily.         Patient confirms/reports the following allergies:  Allergies  Allergen Reactions  . Codeine Nausea And Vomiting and Rash    Patient is appropriate to schedule for requested procedure(s): yes  AUTHORIZATION INFORMATION Primary Insurance:   ID #:   Group #:  Pre-Cert / Auth required:  Pre-Cert / Auth #:   Secondary Insurance:   ID #:   Group #:  Pre-Cert / Auth required:  Pre-Cert / Auth #:   No orders of the defined types were placed in this encounter.    SCHEDULE INFORMATION: Procedure has been scheduled as follows:  Date: 06/12/2012      Time: 3:00 PM  Location: [facility] Short Stay  This Gastroenterology Pre-Precedure Form is being routed to the following provider(s) for review: R. Roetta Sessions, MD

## 2012-06-07 NOTE — Telephone Encounter (Signed)
Rx sent to [facility] pharmacy and pt coming by to pick up instructions.

## 2012-06-07 NOTE — Telephone Encounter (Signed)
Yes, pt is appropriate for screening TCS and EGD. EGD is being performed secondary to refractory reflux despite daily PPI (Dexilant). Also notable is hx of lap band. My last note covers this in detail. Thanks!

## 2012-06-12 ENCOUNTER — Encounter (HOSPITAL_COMMUNITY): Payer: Self-pay | Admitting: *Deleted

## 2012-06-12 ENCOUNTER — Ambulatory Visit (HOSPITAL_COMMUNITY)
Admission: RE | Admit: 2012-06-12 | Discharge: 2012-06-12 | Disposition: A | Discharge status: Home / Self Care | Payer: BC Managed Care – PPO | Source: Ambulatory Visit | Attending: Internal Medicine | Admitting: Internal Medicine

## 2012-06-12 ENCOUNTER — Encounter (HOSPITAL_COMMUNITY)
Admission: RE | Disposition: A | Discharge status: Home / Self Care | Payer: Self-pay | Source: Ambulatory Visit | Attending: Internal Medicine

## 2012-06-12 DIAGNOSIS — K219 Gastro-esophageal reflux disease without esophagitis: Secondary | ICD-10-CM

## 2012-06-12 DIAGNOSIS — Z1211 Encounter for screening for malignant neoplasm of colon: Secondary | ICD-10-CM

## 2012-06-12 DIAGNOSIS — I10 Essential (primary) hypertension: Secondary | ICD-10-CM | POA: Insufficient documentation

## 2012-06-12 DIAGNOSIS — R079 Chest pain, unspecified: Secondary | ICD-10-CM

## 2012-06-12 DIAGNOSIS — R198 Other specified symptoms and signs involving the digestive system and abdomen: Secondary | ICD-10-CM

## 2012-06-12 DIAGNOSIS — D126 Benign neoplasm of colon, unspecified: Secondary | ICD-10-CM | POA: Insufficient documentation

## 2012-06-12 DIAGNOSIS — Z139 Encounter for screening, unspecified: Secondary | ICD-10-CM

## 2012-06-12 HISTORY — PX: COLONOSCOPY WITH ESOPHAGOGASTRODUODENOSCOPY (EGD): SHX5779

## 2012-06-12 SURGERY — COLONOSCOPY WITH ESOPHAGOGASTRODUODENOSCOPY (EGD)
Anesthesia: Moderate Sedation

## 2012-06-12 MED ORDER — BUTAMBEN-TETRACAINE-BENZOCAINE 2-2-14 % EX AERO
INHALATION_SPRAY | CUTANEOUS | Status: DC | PRN
  Administered 2012-06-12: 2 via TOPICAL

## 2012-06-12 MED ORDER — MEPERIDINE HCL 100 MG/ML IJ SOLN
INTRAMUSCULAR | Status: AC
  Filled 2012-06-12: qty 1

## 2012-06-12 MED ORDER — MIDAZOLAM HCL 5 MG/5ML IJ SOLN
INTRAMUSCULAR | Status: DC | PRN
  Administered 2012-06-12: 2 mg via INTRAVENOUS
  Administered 2012-06-12: 1 mg via INTRAVENOUS
  Administered 2012-06-12 (×3): 2 mg via INTRAVENOUS

## 2012-06-12 MED ORDER — SODIUM CHLORIDE 0.45 % IV SOLN: INTRAVENOUS | Status: DC

## 2012-06-12 MED ORDER — MIDAZOLAM HCL 5 MG/5ML IJ SOLN
INTRAMUSCULAR | Status: AC
  Filled 2012-06-12: qty 10

## 2012-06-12 MED ORDER — MEPERIDINE HCL 100 MG/ML IJ SOLN
INTRAMUSCULAR | Status: DC | PRN
  Administered 2012-06-12 (×4): 25 mg via INTRAVENOUS
  Administered 2012-06-12: 50 mg via INTRAVENOUS

## 2012-06-12 MED ORDER — STERILE WATER FOR IRRIGATION IR SOLN
Status: DC | PRN
  Administered 2012-06-12: 14:00:00

## 2012-06-12 NOTE — Op Note (Signed)
[facility] [address] [place], [address]   ENDOSCOPY PROCEDURE REPORT  PATIENT: Patrick Chapman, Patrick Chapman.  MR#: [id number] BIRTHDATE: [birth year] , 51  yrs. old GENDER: Male ENDOSCOPIST: R.  Roetta Sessions, MD FACP FACG REFERRED BY:  Kari Baars, M.D. PROCEDURE DATE:  06/12/2012 PROCEDURE:     Diagnostic EGD  INDICATIONS:     chest pain and reflux symptoms improved on twice a day Dexilant; history of lap band procedure  INFORMED CONSENT:   The risks, benefits, limitations, alternatives and imponderables have been discussed.  The potential for biopsy, esophogeal dilation, etc. have also been reviewed.  Questions have been answered.  All parties agreeable.  Please see the history and physical in the medical record for more information.  MEDICATIONS:    Versed 8 mg IV and Demerol 125 mg IV in divided doses. Cetacaine spray.  DESCRIPTION OF PROCEDURE:   The Pentax Gastroscope [id number] endoscope was introduced through the mouth and advanced to the second portion of the duodenum without difficulty or limitations. The mucosal surfaces were surveyed very carefully during advancement of the scope and upon withdrawal.  Retroflexion view of the proximal stomach and esophagogastric junction was performed.      FINDINGS: Normal-appearing esophageal mucosa. Patent esophagus throughout its course.  Gastric cavity empty.  Extrinsic mass effect at the GE junction consistent with the lap band. Normal gastric mucosa. Patent pylorus. Normal first and second portion of the duodenum.  THERAPEUTIC / DIAGNOSTIC MANEUVERS PERFORMED:  None   COMPLICATIONS:  None  IMPRESSION:  Status post prior lap band placement. Normal esophageal, gastric and duodenal mucosa through the second portion  RECOMMENDATIONS:  Weight loss recommended. Increase Dexilant 60 mg orally twice daily.  See colonoscopy report.    _______________________________ R. Roetta Sessions, MD FACP [facility] eSigned:   R. Roetta Sessions, MD FACP [facility] 06/12/2012 2:22 PM     CC:  PATIENT NAME:  Patrick Chapman, Patrick Chapman. MR#: [id number]

## 2012-06-12 NOTE — Op Note (Addendum)
[facility] [address] [place], [address]   COLONOSCOPY PROCEDURE REPORT  PATIENT: Patrick Chapman, [name].  MR#:         [id number] BIRTHDATE: 08-20-1960 , 51  yrs. old GENDER: Male ENDOSCOPIST: R.  Roetta Sessions, MD FACP [facility] REFERRED BY:   Dr. Juanetta Gosling PROCEDURE DATE:  06/12/2012 PROCEDURE:     Colonoscopy biopsy and snare polypectomy  INDICATIONS: Average risk colorectal cancer screening  INFORMED CONSENT:  The risks, benefits, alternatives and imponderables including but not limited to bleeding, perforation as well as the possibility of a missed lesion have been reviewed.  The potential for biopsy, lesion removal, etc. have also been discussed.  Questions have been answered.  All parties agreeable. Please see the history and physical in the medical record for more information.  MEDICATIONS: Versed 9 mg IV Demerol 150 mg IV in divided doses.  DESCRIPTION OF PROCEDURE:  After a digital rectal exam was performed, the [id number] ([id number]) and Pentax Colonoscope [id number] colonoscope was advanced from the anus through the rectum and colon to the area of the cecum, ileocecal valve and appendiceal orifice. The cecum was deeply intubated.  These structures were well-seen and photographed for the record.  From the level of the cecum and ileocecal valve, the scope was slowly and cautiously withdrawn. The mucosal surfaces were carefully surveyed utilizing scope tip deflection to facilitate fold flattening as needed.  The scope was pulled down into the rectum where a thorough examination including retroflexion was performed.    FINDINGS:  Adequate preparation. Normal rectum. Patient had a 6 millimeter polyp at the splenic flexure; there was a second polyp ,diminutive in size, in the base of the cecum; otherwise, the remainder of colonic mucosa appeared normal.  THERAPEUTIC / DIAGNOSTIC MANEUVERS PERFORMED:  D2 above-mentioned polyps were hot snare and cold biopsied  removed, respectively.  COMPLICATIONS: None  CECAL WITHDRAWAL TIME:  10 minutes  IMPRESSION:  Colonic polyps-removed as described above  RECOMMENDATIONS: Followup on pathology. See EGD report.  Will pursue a right upper quadrant ultrasound to further evaluate his upper abdominal pain.   _______________________________ eSigned:  R. Roetta Sessions, MD FACP [facility] 06/12/2012 2:55 PM   CC:

## 2012-06-12 NOTE — H&P (Signed)
Chief Complaint   Patient presents with   .  Gastrophageal Reflux    HPI:  51 year old pleasant male with hx of obesity, s/p lap band surgery in 2009 by Dr. Daphine Deutscher in [place]. I have seen this nice gentleman in the past, with last visit in June 2012. I had recommended an EGD at that time due to chest discomfort, reflux despite PPI. I was concerned about possible band slippage, erosion less likely. Barium swallow performed at that time showed good positioning of the band. He was set up for an EGD, but for unknown reasons this was not completed. I had spent some time with him discussing proper eating habits after the band surgery, and the risk of worsening reflux after band placement. He continues to gain weight. His highest weight prior to surgery was 370, with a low of 280. He is now up to 317.  States he started a new job March 2013 and began drinking massive amounts of tea and coffee to stay awake. Noted worsening chest pain, reflux. Started on Dexilant BID with improvement in symptoms. Symptoms have now resolved.  No nausea. No dysphagia. Does note occasional upper abdominal pain up through chest. Cardiac work up in past negative. Been on BID for about 2 weeks now. Hasn't seen Dr. Daphine Deutscher recently. One month of symptoms. Not sure how much fluid is in band. Portion sizes about the same for several years.  Past Medical History   Diagnosis  Date   .  GERD (gastroesophageal reflux disease)    .  Hypertension    .  Obesity     Past Surgical History   Procedure  Date   .  Laparoscopic gastric banding  2009     Dr. Daphine Deutscher   .  Cholecystectomy    .  Esophagogastroduodenoscopy  02/14/2011     DID NOT COMPLETE, was scheduled but for unknown reasons was not undertaken    Current Outpatient Prescriptions   Medication  Sig  Dispense  Refill   .  AZOR 5-40 MG per tablet  Take 1 tablet by mouth daily.     Marland Kitchen  dexlansoprazole (DEXILANT) 60 MG capsule  Take 60 mg by mouth 2 (two) times daily.       Allergies as of 05/07/2012 - Review Complete 05/07/2012   Allergen  Reaction  Noted   .  Codeine   10/16/2009    Family History   Problem  Relation  Age of Onset   .  Heart attack  Mother       living    .  Heart attack  Father       living    .  Diabetes  Father    .  Colon cancer  Neg Hx     History    Social History   .  Marital Status:  Married     Spouse Name:  N/A     Number of Children:  N/A   .  Years of Education:  N/A    Social History Main Topics   .  Smoking status:  Never Smoker   .  Smokeless tobacco:  Current User     Types:  Chew   .  Alcohol Use:  Yes      occassional   .  Drug Use:  No   .  Sexually Active:  Yes -- Male partner(s)     Birth Control/ Protection:  Surgical      spouse    Other Topics  Concern   .  None    Social History Narrative   .  None    Review of Systems:  Gen: Denies fever, chills, anorexia. Denies fatigue, weakness, weight loss.  CV: Denies chest pain, palpitations, syncope, peripheral edema, and claudication.  Resp: Denies dyspnea at rest, cough, wheezing, coughing up blood, and pleurisy.  GI: Denies vomiting blood, jaundice, and fecal incontinence. Denies dysphagia or odynophagia.  Derm: Denies rash, itching, dry skin  Psych: Denies depression, anxiety, memory loss, confusion. No homicidal or suicidal ideation.  Heme: Denies bruising, bleeding, and enlarged lymph nodes.  Physical Exam:   General: Alert and oriented. No distress noted. Pleasant and cooperative.  Head: Normocephalic and atraumatic.  Eyes: Conjuctiva clear without scleral icterus.  Mouth: Oral mucosa pink and moist. Good dentition. No lesions.  Heart: S1, S2 present without murmurs, rubs, or gallops. Regular rate and rhythm.  Abdomen: +BS, soft, non-tender and non-distended. No rebound or guarding. No HSM or masses noted. Port easily palpable left-sided abdomen.  Msk: Symmetrical without gross deformities. Normal posture.  Extremities: Without  edema.  Neurologic: Alert and oriented x4; grossly normal neurologically.  Skin: Intact without significant lesions or rashes.  Psych: Alert and cooperative. Normal mood and affect.  GERD (gastroesophageal reflux disease)  51 year old male with hx of lap band surgery in 2009, now with worsening of GERD symptoms. Please see the HPI for further details. Barium swallow last year showed adequate band placement. Has not followed up with Dr. Daphine Deutscher recently. Has recently been ingesting large amounts of tea, coffee to stay awake at new job. However, with cessation of this, as not mentioned above, symptoms have improved. He is on Dexilant BID. Continues to have intermittent upper abdominal discomfort, radiating up to chest. Negative cardiac work-up in past.  I continue to offer and recommend an upper endoscopy due to refractory reflux symptoms, upper abdominal discomfort and chest discomfort. Although the band procedure can make reflux worse, this would not entirely explain his symptoms. He has also gained a considerable amount of weight back, worsening his symptoms as well. Dexilant BID has worked well for him. Differentials at this point include band too tight, reflux secondary to behavior and weight gain, reflux symptoms as a result of the band itself, and more concerning possible band erosion.  I have asked him to decrease Dexilant to once a day, avoid reflux triggers, wt loss. He is to call me in 1 week. At this point, he is not thrilled about pursuing upper GI evaluation. I have discussed with him the importance of evaluation, and he would like to wait an additional week. At this time, he will decide on the EGD. At that time, I have offered him an initial, average risk screening colonoscopy. He turns 51 the middle of October.  Pt agreed and stated understanding.  I have asked that he also follow-up with Dr. Daphine Deutscher in the near future.   Patient drop back on Dexilant once daily and have her current symptoms.  He went back to twice a day in symptoms evidence of down.

## 2012-06-14 ENCOUNTER — Telehealth (INDEPENDENT_AMBULATORY_CARE_PROVIDER_SITE_OTHER): Payer: Self-pay

## 2012-06-14 ENCOUNTER — Telehealth: Payer: Self-pay | Admitting: Internal Medicine

## 2012-06-14 ENCOUNTER — Telehealth (INDEPENDENT_AMBULATORY_CARE_PROVIDER_SITE_OTHER): Payer: Self-pay | Admitting: General Surgery

## 2012-06-14 NOTE — Telephone Encounter (Signed)
Pt called stating he has been seeing Dr Kendell [facility] and had endo with work up for reflux. Pt offered appt with Dr Daphine Deutscher but pt requests Dr Daphine Deutscher review records in epic and if he needs appt he will make one at that time. Pt states he was advised to f/u with Dr Daphine Deutscher but pt wants to know if he should have any testing or xrays other than what has already been done.

## 2012-06-14 NOTE — Telephone Encounter (Signed)
Pt LMOM that he needed to speak with RMR. He doesn't remember clearly what was said after procedure. He said it was regarding RMR saying his lap band was loose and he was concerned about that. Please call patient at [phone]

## 2012-06-14 NOTE — Telephone Encounter (Signed)
Had AS take a look at pts procedure note and pictures. There was no erosion in his lap band and pt needed to follow up with Dr. Daphine Deutscher and maybe have the lap band loosened. Pt is aware and he will call Dr. Daphine Deutscher and schedule a follow up visit with him.

## 2012-06-14 NOTE — Telephone Encounter (Signed)
Spoke with pt and informed him that I had Dr. Daphine Deutscher review his chart.  Dr. Daphine Deutscher believes he might be a little too tight and that we may need to remove some fluid from his band.  I informed him that I had an opening with our [facility] 11/14 at 8:30.  He said he would take this.

## 2012-06-14 NOTE — Telephone Encounter (Signed)
Routing to AS for review. 

## 2012-06-15 ENCOUNTER — Telehealth: Payer: Self-pay

## 2012-06-15 NOTE — Telephone Encounter (Signed)
Opened in error

## 2012-06-17 ENCOUNTER — Encounter: Payer: Self-pay | Admitting: Internal Medicine

## 2012-06-18 ENCOUNTER — Encounter (HOSPITAL_COMMUNITY): Payer: Self-pay | Admitting: Internal Medicine

## 2012-06-19 ENCOUNTER — Encounter: Payer: Self-pay | Admitting: *Deleted

## 2012-06-19 NOTE — Telephone Encounter (Signed)
Agree. Reviewed pictures with Raynelle Fanning.

## 2012-06-21 ENCOUNTER — Ambulatory Visit (INDEPENDENT_AMBULATORY_CARE_PROVIDER_SITE_OTHER): Payer: BC Managed Care – PPO | Admitting: Physician Assistant

## 2012-06-21 ENCOUNTER — Encounter (INDEPENDENT_AMBULATORY_CARE_PROVIDER_SITE_OTHER): Payer: Self-pay

## 2012-06-21 DIAGNOSIS — Z9884 Bariatric surgery status: Secondary | ICD-10-CM

## 2012-06-21 NOTE — Progress Notes (Signed)
  HISTORY: Patrick Chapman is a 51 y.o.male who received an AP-Large lap-band in March 2009 by Dr. Daphine Deutscher. He comes in with complaints of ongoing GERD. He's been seen by GI and has received both an upper endoscopy and colonoscopy. The results were reviewed and appear normal. He's been on Dexilant which has helped but his primary complaint today is his intolerance of things like coffee and onions. They produce severe upper abdominal discomfort. Other foods tend to be well-tolerated. He also had issues with diarrhea prior to taking Dexilant but since starting it, he's had no further problems. He denies persistent regurgitation.  VITAL SIGNS: Filed Vitals:   06/21/12 0838  BP: 138/82  Pulse: 78  Temp: 97.6 F (36.4 C)  Resp: 18    PHYSICAL EXAM: Physical exam reveals a very well-appearing 51 y.o.male in no apparent distress Neurologic: Awake, alert, oriented Psych: Bright affect, conversant Respiratory: Breathing even and unlabored. No stridor or wheezing Extremities: Atraumatic, good range of motion. Skin: Warm, Dry, no rashes Musculoskeletal: Normal gait, Joints normal  ASSESMENT: 51 y.o.  male  s/p AP-Large lap-band.   PLAN: I'm ordering a KUB to check band placement. I asked him to continue his dexilant. I'm not certain that there's a surgical explanation for his intolerance of particular foods, especially given a normal EGD. We'll follow-up on the results and see him again in three months unless his symptoms change.

## 2012-06-21 NOTE — Patient Instructions (Signed)
Return in 3 months. Focus on good food choices as well as physical activity. Return sooner if you have an increase in hunger, portion sizes or weight. Return also for difficulty swallowing, night cough, reflux. 

## 2012-08-15 NOTE — Progress Notes (Signed)
Pt not seen by me

## 2012-09-20 ENCOUNTER — Encounter (INDEPENDENT_AMBULATORY_CARE_PROVIDER_SITE_OTHER): Payer: BC Managed Care – PPO

## 2012-09-27 ENCOUNTER — Encounter (INDEPENDENT_AMBULATORY_CARE_PROVIDER_SITE_OTHER): Payer: BC Managed Care – PPO

## 2013-12-25 ENCOUNTER — Other Ambulatory Visit (HOSPITAL_COMMUNITY): Payer: Self-pay | Admitting: Pulmonary Disease

## 2013-12-25 DIAGNOSIS — M542 Cervicalgia: Secondary | ICD-10-CM

## 2013-12-31 ENCOUNTER — Ambulatory Visit
Admission: RE | Admit: 2013-12-31 | Discharge: 2013-12-31 | Disposition: A | Discharge status: Home / Self Care | Payer: BC Managed Care – PPO | Source: Ambulatory Visit | Attending: Pulmonary Disease | Admitting: Pulmonary Disease

## 2013-12-31 ENCOUNTER — Other Ambulatory Visit: Payer: Self-pay | Admitting: Pulmonary Disease

## 2013-12-31 ENCOUNTER — Ambulatory Visit (HOSPITAL_COMMUNITY)
Admission: RE | Admit: 2013-12-31 | Discharge: 2013-12-31 | Disposition: A | Discharge status: Home / Self Care | Payer: BC Managed Care – PPO | Source: Ambulatory Visit | Attending: Pulmonary Disease | Admitting: Pulmonary Disease

## 2013-12-31 DIAGNOSIS — Z139 Encounter for screening, unspecified: Secondary | ICD-10-CM

## 2013-12-31 DIAGNOSIS — M542 Cervicalgia: Secondary | ICD-10-CM

## 2014-01-01 ENCOUNTER — Other Ambulatory Visit (HOSPITAL_COMMUNITY): Payer: Self-pay | Admitting: Pulmonary Disease

## 2014-01-01 ENCOUNTER — Ambulatory Visit
Admission: RE | Admit: 2014-01-01 | Discharge: 2014-01-01 | Disposition: A | Discharge status: Home / Self Care | Payer: BC Managed Care – PPO | Source: Ambulatory Visit | Attending: Pulmonary Disease | Admitting: Pulmonary Disease

## 2014-01-01 ENCOUNTER — Ambulatory Visit: Admission: RE | Admit: 2014-01-01 | Payer: BC Managed Care – PPO | Source: Ambulatory Visit

## 2014-01-01 DIAGNOSIS — M542 Cervicalgia: Secondary | ICD-10-CM

## 2014-06-04 ENCOUNTER — Other Ambulatory Visit (HOSPITAL_COMMUNITY): Payer: Self-pay | Admitting: Neurosurgery

## 2014-08-04 NOTE — Pre-Procedure Instructions (Signed)
Zahid Carneiro.  08/04/2014   Your procedure is scheduled on:  Friday, January 8.  Report to [facility] Admitting at 6:30AM.  Call this number if you have problems the morning of surgery: [phone]                 For any other questions, please call [phone], Monday - Friday 8 AM - 4 PM.    Remember:   Do not eat food or drink liquids after midnight Thursday, January 7.   Take these medicines the morning of surgery with A SIP OF WATER: dexlansoprazole (DEXILANT).   Do not wear jewelry, make-up or nail polish.  Do not wear lotions, powders, or perfumes.  Do not shave 48 hours prior to surgery.   Do not bring valuables to the hospital.              [facility] is not responsible for any belongings or valuables.               Contacts, dentures or bridgework may not be worn into surgery.  Leave suitcase in the car. After surgery it may be brought to your room.  For patients admitted to the hospital, discharge time is determined by your treatment team.               Patients discharged the day of surgery will not be allowed to drive home.  Name and phone number of your driver: -   Special Instructions: -   Please read over the following fact sheets that you were given: Pain Booklet, Coughing and Deep Breathing, Blood Transfusion Information and Surgical Site Infection Prevention

## 2014-08-05 ENCOUNTER — Encounter (HOSPITAL_COMMUNITY)
Admission: RE | Admit: 2014-08-05 | Discharge: 2014-08-05 | Disposition: A | Discharge status: Home / Self Care | Payer: BC Managed Care – PPO | Source: Ambulatory Visit | Attending: Neurosurgery | Admitting: Neurosurgery

## 2014-08-05 ENCOUNTER — Encounter (HOSPITAL_COMMUNITY): Payer: Self-pay

## 2014-08-05 DIAGNOSIS — K219 Gastro-esophageal reflux disease without esophagitis: Secondary | ICD-10-CM | POA: Diagnosis not present

## 2014-08-05 DIAGNOSIS — I4519 Other right bundle-branch block: Secondary | ICD-10-CM | POA: Insufficient documentation

## 2014-08-05 DIAGNOSIS — I498 Other specified cardiac arrhythmias: Secondary | ICD-10-CM | POA: Diagnosis not present

## 2014-08-05 DIAGNOSIS — I10 Essential (primary) hypertension: Secondary | ICD-10-CM | POA: Insufficient documentation

## 2014-08-05 DIAGNOSIS — Z01818 Encounter for other preprocedural examination: Secondary | ICD-10-CM | POA: Insufficient documentation

## 2014-08-05 DIAGNOSIS — Z9884 Bariatric surgery status: Secondary | ICD-10-CM | POA: Diagnosis not present

## 2014-08-05 HISTORY — DX: Sleep apnea, unspecified: G47.30

## 2014-08-05 LAB — BASIC METABOLIC PANEL
Anion gap: 10 (ref 5–15)
BUN: 16 mg/dL (ref 6–23)
CO2: 28 mmol/L (ref 19–32)
Calcium: 9.4 mg/dL (ref 8.4–10.5)
Chloride: 104 mEq/L (ref 96–112)
Creatinine, Ser: 1.25 mg/dL (ref 0.50–1.35)
GFR calc Af Amer: 75 mL/min — ABNORMAL LOW (ref >90)
GFR calc non Af Amer: 65 mL/min — ABNORMAL LOW (ref >90)
Glucose, Bld: 117 mg/dL — ABNORMAL HIGH (ref 70–99)
Potassium: 4.6 mmol/L (ref 3.5–5.1)
Sodium: 142 mmol/L (ref 135–145)

## 2014-08-05 LAB — CBC
HCT: 46.8 % (ref 39.0–52.0)
Hemoglobin: 15.2 g/dL (ref 13.0–17.0)
MCH: 30.5 pg (ref 26.0–34.0)
MCHC: 32.5 g/dL (ref 30.0–36.0)
MCV: 94 fL (ref 78.0–100.0)
Platelets: 193 10*3/uL (ref 150–400)
RBC: 4.98 MIL/uL (ref 4.22–5.81)
RDW: 13.7 % (ref 11.5–15.5)
WBC: 7.5 10*3/uL (ref 4.0–10.5)

## 2014-08-05 LAB — SURGICAL PCR SCREEN
MRSA, PCR: NEGATIVE
Staphylococcus aureus: POSITIVE — AB

## 2014-08-05 NOTE — Progress Notes (Signed)
Prior to his lap band surgery (2009), he was having some 'chest discomfort'  Echo was done and it was determined that his sx were GERD.  He's having NO issues now. Also, he had a sleep study done also (around 2009), never wore the mask, because after the lap band, he lost 80 lbs and has been doing ok.

## 2014-08-14 MED ORDER — DEXTROSE 5 % IV SOLN
3.0000 g | INTRAVENOUS | Status: AC
  Administered 2014-08-15: 3 g via INTRAVENOUS
  Filled 2014-08-14: qty 3000

## 2014-08-14 NOTE — Anesthesia Preprocedure Evaluation (Addendum)
Anesthesia Evaluation  Patient identified by MRN, date of birth, ID band Patient awake    Reviewed: Allergy & Precautions, NPO status , Patient's Chart, lab work & pertinent test results, reviewed documented beta blocker date and time   Airway Mallampati: II   Neck ROM: Full    Dental  (+) Teeth Intact   Pulmonary sleep apnea ,  breath sounds clear to auscultation        Cardiovascular hypertension, Pt. 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   Neuro/Psych    GI/Hepatic Neg liver ROS, GERD-  Medicated,SP LAP BAND   Endo/Other  negative endocrine ROS  Renal/GU GFR 65     Musculoskeletal   Abdominal (+) + obese,   Peds  Hematology   Anesthesia Other Findings   Reproductive/Obstetrics                            Anesthesia Physical Anesthesia Plan  ASA: III  Anesthesia Plan: General   Post-op Pain Management:    Induction: Intravenous  Airway Management Planned: Oral ETT and Video Laryngoscope Planned  Additional Equipment:   Intra-op Plan:   Post-operative Plan: Extubation in OR  Informed Consent: I have reviewed the patients History and Physical, chart, labs and discussed the procedure including the risks, benefits and alternatives for the proposed anesthesia with the patient or authorized representative who has indicated his/her understanding and acceptance.     Plan Discussed with:   Anesthesia Plan Comments:         Anesthesia Quick Evaluation

## 2014-08-15 ENCOUNTER — Ambulatory Visit (HOSPITAL_COMMUNITY): Payer: BC Managed Care – PPO | Admitting: Anesthesiology

## 2014-08-15 ENCOUNTER — Encounter (HOSPITAL_COMMUNITY)
Admission: RE | Disposition: A | Discharge status: Home / Self Care | Payer: Self-pay | Source: Ambulatory Visit | Attending: Neurosurgery

## 2014-08-15 ENCOUNTER — Observation Stay (HOSPITAL_COMMUNITY)
Admission: RE | Admit: 2014-08-15 | Discharge: 2014-08-15 | Disposition: A | Discharge status: Home / Self Care | Payer: BC Managed Care – PPO | Source: Ambulatory Visit | Attending: Neurosurgery | Admitting: Neurosurgery

## 2014-08-15 ENCOUNTER — Encounter (HOSPITAL_COMMUNITY): Payer: Self-pay | Admitting: *Deleted

## 2014-08-15 ENCOUNTER — Ambulatory Visit (HOSPITAL_COMMUNITY): Payer: BC Managed Care – PPO

## 2014-08-15 DIAGNOSIS — E669 Obesity, unspecified: Secondary | ICD-10-CM | POA: Diagnosis not present

## 2014-08-15 DIAGNOSIS — I10 Essential (primary) hypertension: Secondary | ICD-10-CM | POA: Insufficient documentation

## 2014-08-15 DIAGNOSIS — Z6841 Body Mass Index (BMI) 40.0 and over, adult: Secondary | ICD-10-CM | POA: Diagnosis not present

## 2014-08-15 DIAGNOSIS — K219 Gastro-esophageal reflux disease without esophagitis: Secondary | ICD-10-CM | POA: Insufficient documentation

## 2014-08-15 DIAGNOSIS — M4722 Other spondylosis with radiculopathy, cervical region: Principal | ICD-10-CM | POA: Insufficient documentation

## 2014-08-15 DIAGNOSIS — M4322 Fusion of spine, cervical region: Secondary | ICD-10-CM

## 2014-08-15 HISTORY — PX: ANTERIOR CERVICAL DECOMP/DISCECTOMY FUSION: SHX1161

## 2014-08-15 LAB — CBC
HCT: 44.3 % (ref 39.0–52.0)
Hemoglobin: 14.3 g/dL (ref 13.0–17.0)
MCH: 30.2 pg (ref 26.0–34.0)
MCHC: 32.3 g/dL (ref 30.0–36.0)
MCV: 93.7 fL (ref 78.0–100.0)
Platelets: 201 10*3/uL (ref 150–400)
RBC: 4.73 MIL/uL (ref 4.22–5.81)
RDW: 13.7 % (ref 11.5–15.5)
WBC: 9.7 10*3/uL (ref 4.0–10.5)

## 2014-08-15 LAB — CREATININE, SERUM
Creatinine, Ser: 1.26 mg/dL (ref 0.50–1.35)
GFR calc Af Amer: 74 mL/min — ABNORMAL LOW (ref >90)
GFR calc non Af Amer: 64 mL/min — ABNORMAL LOW (ref >90)

## 2014-08-15 SURGERY — ANTERIOR CERVICAL DECOMPRESSION/DISCECTOMY FUSION 1 LEVEL
Anesthesia: General | Site: Neck

## 2014-08-15 MED ORDER — DIAZEPAM 5 MG PO TABS
ORAL_TABLET | ORAL | Status: AC
  Filled 2014-08-15: qty 1

## 2014-08-15 MED ORDER — PROMETHAZINE HCL 25 MG/ML IJ SOLN: 6.2500 mg | INTRAMUSCULAR | Status: DC | PRN

## 2014-08-15 MED ORDER — FENTANYL CITRATE 0.05 MG/ML IJ SOLN
INTRAMUSCULAR | Status: AC
  Filled 2014-08-15: qty 5

## 2014-08-15 MED ORDER — ROCURONIUM BROMIDE 100 MG/10ML IV SOLN
INTRAVENOUS | Status: DC | PRN
  Administered 2014-08-15: 50 mg via INTRAVENOUS
  Administered 2014-08-15 (×2): 10 mg via INTRAVENOUS

## 2014-08-15 MED ORDER — 0.9 % SODIUM CHLORIDE (POUR BTL) OPTIME
TOPICAL | Status: DC | PRN
  Administered 2014-08-15: 1000 mL

## 2014-08-15 MED ORDER — MIDAZOLAM HCL 5 MG/5ML IJ SOLN
INTRAMUSCULAR | Status: DC | PRN
Start: 2014-08-15 — End: 2014-08-15
  Administered 2014-08-15: 2 mg via INTRAVENOUS

## 2014-08-15 MED ORDER — NEOSTIGMINE METHYLSULFATE 10 MG/10ML IV SOLN
INTRAVENOUS | Status: DC | PRN
  Administered 2014-08-15: 4 mg via INTRAVENOUS

## 2014-08-15 MED ORDER — PROPOFOL 10 MG/ML IV BOLUS
INTRAVENOUS | Status: DC | PRN
  Administered 2014-08-15: 50 mg via INTRAVENOUS
  Administered 2014-08-15: 250 mg via INTRAVENOUS

## 2014-08-15 MED ORDER — OXYCODONE-ACETAMINOPHEN 5-325 MG PO TABS
1.0000 | ORAL_TABLET | ORAL | Status: DC | PRN
  Administered 2014-08-15 (×2): 2 via ORAL
  Filled 2014-08-15 (×2): qty 2

## 2014-08-15 MED ORDER — MULTI-VITAMIN/MINERALS PO TABS: 1.0000 | ORAL_TABLET | Freq: Every day | ORAL | Status: DC

## 2014-08-15 MED ORDER — LORATADINE 10 MG PO TABS
10.0000 mg | ORAL_TABLET | Freq: Every day | ORAL | Status: DC | PRN
  Filled 2014-08-15: qty 1

## 2014-08-15 MED ORDER — MEPERIDINE HCL 25 MG/ML IJ SOLN: 6.2500 mg | INTRAMUSCULAR | Status: DC | PRN

## 2014-08-15 MED ORDER — BUPIVACAINE HCL 0.5 % IJ SOLN
INTRAMUSCULAR | Status: DC | PRN
  Administered 2014-08-15: 3 mL

## 2014-08-15 MED ORDER — ACETAMINOPHEN 325 MG PO TABS: 650.0000 mg | ORAL_TABLET | ORAL | Status: DC | PRN

## 2014-08-15 MED ORDER — SODIUM CHLORIDE 0.9 % IJ SOLN: 3.0000 mL | INTRAMUSCULAR | Status: DC | PRN

## 2014-08-15 MED ORDER — ACETAMINOPHEN 650 MG RE SUPP: 650.0000 mg | RECTAL | Status: DC | PRN

## 2014-08-15 MED ORDER — DOCUSATE SODIUM 100 MG PO CAPS: 100.0000 mg | ORAL_CAPSULE | Freq: Two times a day (BID) | ORAL | Status: DC

## 2014-08-15 MED ORDER — SODIUM CHLORIDE 0.9 % IV SOLN: 250.0000 mL | INTRAVENOUS | Status: DC

## 2014-08-15 MED ORDER — DEXAMETHASONE SODIUM PHOSPHATE 4 MG/ML IJ SOLN
INTRAMUSCULAR | Status: DC | PRN
  Administered 2014-08-15: 4 mg via INTRAVENOUS

## 2014-08-15 MED ORDER — OXYCODONE-ACETAMINOPHEN 10-325 MG PO TABS: 1.0000 | ORAL_TABLET | ORAL | Status: DC | PRN

## 2014-08-15 MED ORDER — PROPOFOL 10 MG/ML IV BOLUS
INTRAVENOUS | Status: AC
  Filled 2014-08-15: qty 20

## 2014-08-15 MED ORDER — LIDOCAINE HCL (CARDIAC) 20 MG/ML IV SOLN
INTRAVENOUS | Status: DC | PRN
  Administered 2014-08-15: 100 mg via INTRAVENOUS

## 2014-08-15 MED ORDER — CEFAZOLIN SODIUM 1-5 GM-% IV SOLN
1.0000 g | Freq: Three times a day (TID) | INTRAVENOUS | Status: DC
  Administered 2014-08-15: 1 g via INTRAVENOUS
  Filled 2014-08-15 (×2): qty 50

## 2014-08-15 MED ORDER — EPHEDRINE SULFATE 50 MG/ML IJ SOLN
INTRAMUSCULAR | Status: DC | PRN
  Administered 2014-08-15 (×2): 5 mg via INTRAVENOUS

## 2014-08-15 MED ORDER — LIDOCAINE-EPINEPHRINE 1 %-1:100000 IJ SOLN
INTRAMUSCULAR | Status: DC | PRN
  Administered 2014-08-15: 3 mL

## 2014-08-15 MED ORDER — THROMBIN 5000 UNITS EX SOLR
CUTANEOUS | Status: DC | PRN
  Administered 2014-08-15 (×2): 5000 [IU] via TOPICAL

## 2014-08-15 MED ORDER — SENNA 8.6 MG PO TABS: 1.0000 | ORAL_TABLET | Freq: Two times a day (BID) | ORAL | Status: DC

## 2014-08-15 MED ORDER — GLYCOPYRROLATE 0.2 MG/ML IJ SOLN
INTRAMUSCULAR | Status: DC | PRN
  Administered 2014-08-15: .6 mg via INTRAVENOUS

## 2014-08-15 MED ORDER — PANTOPRAZOLE SODIUM 40 MG PO TBEC: 40.0000 mg | DELAYED_RELEASE_TABLET | Freq: Every day | ORAL | Status: DC

## 2014-08-15 MED ORDER — MORPHINE SULFATE 2 MG/ML IJ SOLN: 1.0000 mg | INTRAMUSCULAR | Status: DC | PRN

## 2014-08-15 MED ORDER — THROMBIN 5000 UNITS EX SOLR
OROMUCOSAL | Status: DC | PRN
  Administered 2014-08-15: 10 mL via TOPICAL

## 2014-08-15 MED ORDER — LACTATED RINGERS IV SOLN
INTRAVENOUS | Status: DC | PRN
  Administered 2014-08-15 (×2): via INTRAVENOUS

## 2014-08-15 MED ORDER — ADULT MULTIVITAMIN W/MINERALS CH
1.0000 | ORAL_TABLET | Freq: Every day | ORAL | Status: DC
  Filled 2014-08-15: qty 1

## 2014-08-15 MED ORDER — FENTANYL CITRATE 0.05 MG/ML IJ SOLN
INTRAMUSCULAR | Status: DC | PRN
  Administered 2014-08-15 (×5): 50 ug via INTRAVENOUS

## 2014-08-15 MED ORDER — HEPARIN SODIUM (PORCINE) 5000 UNIT/ML IJ SOLN
5000.0000 [IU] | Freq: Three times a day (TID) | INTRAMUSCULAR | Status: DC
  Filled 2014-08-15: qty 1

## 2014-08-15 MED ORDER — DIAZEPAM 5 MG PO TABS
5.0000 mg | ORAL_TABLET | Freq: Four times a day (QID) | ORAL | Status: DC | PRN
Start: 2014-08-15 — End: 2014-08-15
  Administered 2014-08-15: 5 mg via ORAL

## 2014-08-15 MED ORDER — ONDANSETRON HCL 4 MG/2ML IJ SOLN
INTRAMUSCULAR | Status: DC | PRN
  Administered 2014-08-15: 4 mg via INTRAVENOUS

## 2014-08-15 MED ORDER — AMLODIPINE-OLMESARTAN 5-40 MG PO TABS: 1.0000 | ORAL_TABLET | Freq: Every day | ORAL | Status: DC

## 2014-08-15 MED ORDER — LACTATED RINGERS IV SOLN
INTRAVENOUS | Status: DC
  Administered 2014-08-15: 07:00:00 via INTRAVENOUS

## 2014-08-15 MED ORDER — FENTANYL CITRATE 0.05 MG/ML IJ SOLN
25.0000 ug | INTRAMUSCULAR | Status: DC | PRN
  Administered 2014-08-15: 25 ug via INTRAVENOUS

## 2014-08-15 MED ORDER — IRBESARTAN 300 MG PO TABS
300.0000 mg | ORAL_TABLET | Freq: Every day | ORAL | Status: DC
  Filled 2014-08-15: qty 1

## 2014-08-15 MED ORDER — ONDANSETRON HCL 4 MG/2ML IJ SOLN
4.0000 mg | INTRAMUSCULAR | Status: DC | PRN
Start: 2014-08-15 — End: 2014-08-15

## 2014-08-15 MED ORDER — MENTHOL 3 MG MT LOZG: 1.0000 | LOZENGE | OROMUCOSAL | Status: DC | PRN

## 2014-08-15 MED ORDER — FENTANYL CITRATE 0.05 MG/ML IJ SOLN
INTRAMUSCULAR | Status: AC
  Administered 2014-08-15: 25 ug via INTRAVENOUS
  Filled 2014-08-15: qty 2

## 2014-08-15 MED ORDER — ARTIFICIAL TEARS OP OINT
TOPICAL_OINTMENT | OPHTHALMIC | Status: DC | PRN
  Administered 2014-08-15: 1 via OPHTHALMIC

## 2014-08-15 MED ORDER — SODIUM CHLORIDE 0.9 % IR SOLN
Status: DC | PRN
  Administered 2014-08-15: 500 mL

## 2014-08-15 MED ORDER — PHENYLEPHRINE HCL 10 MG/ML IJ SOLN
10.0000 mg | INTRAVENOUS | Status: DC | PRN
  Administered 2014-08-15: 5 ug/min via INTRAVENOUS

## 2014-08-15 MED ORDER — SODIUM CHLORIDE 0.9 % IJ SOLN: 3.0000 mL | Freq: Two times a day (BID) | INTRAMUSCULAR | Status: DC

## 2014-08-15 MED ORDER — MIDAZOLAM HCL 2 MG/2ML IJ SOLN
INTRAMUSCULAR | Status: AC
  Filled 2014-08-15: qty 2

## 2014-08-15 MED ORDER — DIAZEPAM 5 MG PO TABS: 5.0000 mg | ORAL_TABLET | Freq: Four times a day (QID) | ORAL | Status: DC | PRN

## 2014-08-15 MED ORDER — SODIUM CHLORIDE 0.9 % IV SOLN: INTRAVENOUS | Status: DC

## 2014-08-15 MED ORDER — AMLODIPINE BESYLATE 5 MG PO TABS
5.0000 mg | ORAL_TABLET | Freq: Every day | ORAL | Status: DC
  Filled 2014-08-15: qty 1

## 2014-08-15 MED ORDER — ASPIRIN 81 MG PO TABS: 81.0000 mg | ORAL_TABLET | Freq: Every day | ORAL | Status: DC

## 2014-08-15 MED ORDER — HEMOSTATIC AGENTS (NO CHARGE) OPTIME
TOPICAL | Status: DC | PRN
  Administered 2014-08-15: 1 via TOPICAL

## 2014-08-15 MED ORDER — PHENOL 1.4 % MT LIQD: 1.0000 | OROMUCOSAL | Status: DC | PRN

## 2014-08-15 SURGICAL SUPPLY — 67 items
APL SKNCLS STERI-STRIP NONHPOA (GAUZE/BANDAGES/DRESSINGS)
BAG DECANTER FOR FLEXI CONT (MISCELLANEOUS) ×2 IMPLANT
BENZOIN TINCTURE PRP APPL 2/3 (GAUZE/BANDAGES/DRESSINGS) IMPLANT
BLADE CLIPPER SURG (BLADE) IMPLANT
BLADE SURG 11 STRL SS (BLADE) ×2 IMPLANT
BUR MATCHSTICK NEURO 3.0 LAGG (BURR) ×2 IMPLANT
CANISTER SUCT 3000ML (MISCELLANEOUS) ×2 IMPLANT
CONT SPEC 4OZ CLIKSEAL STRL BL (MISCELLANEOUS) ×2 IMPLANT
DECANTER SPIKE VIAL GLASS SM (MISCELLANEOUS) ×2 IMPLANT
DRAIN CHANNEL 10M FLAT 3/4 FLT (DRAIN) IMPLANT
DRAPE C-ARM 42X72 X-RAY (DRAPES) ×4 IMPLANT
DRAPE LAPAROTOMY 100X72 PEDS (DRAPES) ×2 IMPLANT
DRAPE MICROSCOPE LEICA (MISCELLANEOUS) ×2 IMPLANT
DRAPE POUCH INSTRU U-SHP 10X18 (DRAPES) ×2 IMPLANT
DRAPE PROXIMA HALF (DRAPES) IMPLANT
DRSG OPSITE POSTOP 3X4 (GAUZE/BANDAGES/DRESSINGS) ×2 IMPLANT
DRSG TEGADERM 4X4.75 (GAUZE/BANDAGES/DRESSINGS) ×8 IMPLANT
DURAPREP 6ML APPLICATOR 50/CS (WOUND CARE) ×2 IMPLANT
ELECT COATED BLADE 2.86 ST (ELECTRODE) ×2 IMPLANT
ELECT REM PT RETURN 9FT ADLT (ELECTROSURGICAL) ×2
ELECTRODE REM PT RTRN 9FT ADLT (ELECTROSURGICAL) ×1 IMPLANT
EVACUATOR SILICONE 100CC (DRAIN) IMPLANT
GAUZE SPONGE 4X4 16PLY XRAY LF (GAUZE/BANDAGES/DRESSINGS) IMPLANT
GLOVE BIOGEL PI IND STRL 7.0 (GLOVE) IMPLANT
GLOVE BIOGEL PI IND STRL 7.5 (GLOVE) ×1 IMPLANT
GLOVE BIOGEL PI INDICATOR 7.0 (GLOVE) ×2
GLOVE BIOGEL PI INDICATOR 7.5 (GLOVE) ×1
GLOVE ECLIPSE 7.0 STRL STRAW (GLOVE) ×2 IMPLANT
GLOVE EXAM NITRILE LRG STRL (GLOVE) IMPLANT
GLOVE EXAM NITRILE MD LF STRL (GLOVE) IMPLANT
GLOVE EXAM NITRILE XL STR (GLOVE) IMPLANT
GLOVE EXAM NITRILE XS STR PU (GLOVE) IMPLANT
GLOVE SS N UNI LF 7.0 STRL (GLOVE) ×4 IMPLANT
GOWN STRL REUS W/ TWL LRG LVL3 (GOWN DISPOSABLE) ×2 IMPLANT
GOWN STRL REUS W/ TWL XL LVL3 (GOWN DISPOSABLE) IMPLANT
GOWN STRL REUS W/TWL 2XL LVL3 (GOWN DISPOSABLE) IMPLANT
GOWN STRL REUS W/TWL LRG LVL3 (GOWN DISPOSABLE) ×6
GOWN STRL REUS W/TWL XL LVL3 (GOWN DISPOSABLE) ×4
HEMOSTAT POWDER KIT SURGIFOAM (HEMOSTASIS) ×1 IMPLANT
HEMOSTAT POWDER SURGIFOAM 1G (HEMOSTASIS) ×2 IMPLANT
KIT BASIN OR (CUSTOM PROCEDURE TRAY) ×2 IMPLANT
KIT ROOM TURNOVER OR (KITS) ×2 IMPLANT
LIQUID BAND (GAUZE/BANDAGES/DRESSINGS) ×2 IMPLANT
NDL HYPO 25X1 1.5 SAFETY (NEEDLE) ×1 IMPLANT
NDL SPNL 22GX3.5 QUINCKE BK (NEEDLE) ×1 IMPLANT
NEEDLE HYPO 25X1 1.5 SAFETY (NEEDLE) ×2 IMPLANT
NEEDLE SPNL 22GX3.5 QUINCKE BK (NEEDLE) ×2 IMPLANT
NS IRRIG 1000ML POUR BTL (IV SOLUTION) ×2 IMPLANT
PACK LAMINECTOMY NEURO (CUSTOM PROCEDURE TRAY) ×2 IMPLANT
PAD ARMBOARD 7.5X6 YLW CONV (MISCELLANEOUS) ×6 IMPLANT
PEEK VISTA-S 11X14X6MM-7 (Peek) ×1 IMPLANT
PLATE INVIZIA 1 LEV 22MM (Plate) ×1 IMPLANT
PUTTY BONE GRAFT KIT 2.5ML (Bone Implant) ×1 IMPLANT
RUBBERBAND STERILE (MISCELLANEOUS) ×4 IMPLANT
SCREW FIXED 12MM (Screw) ×1 IMPLANT
SCREW SELF DRILL VAR 12MM (Screw) ×3 IMPLANT
SPONGE INTESTINAL PEANUT (DISPOSABLE) ×2 IMPLANT
SPONGE SURGIFOAM ABS GEL SZ50 (HEMOSTASIS) ×2 IMPLANT
STRIP CLOSURE SKIN 1/2X4 (GAUZE/BANDAGES/DRESSINGS) IMPLANT
SUT ETHILON 3 0 FSL (SUTURE) IMPLANT
SUT VIC AB 3-0 SH 8-18 (SUTURE) ×2 IMPLANT
SUT VICRYL 3-0 RB1 18 ABS (SUTURE) ×4 IMPLANT
SYR 20ML ECCENTRIC (SYRINGE) ×2 IMPLANT
TAPE CLOTH 2X10 TAN LF (GAUZE/BANDAGES/DRESSINGS) ×2 IMPLANT
TOWEL OR 17X24 6PK STRL BLUE (TOWEL DISPOSABLE) ×2 IMPLANT
TOWEL OR 17X26 10 PK STRL BLUE (TOWEL DISPOSABLE) ×2 IMPLANT
WATER STERILE IRR 1000ML POUR (IV SOLUTION) ×2 IMPLANT

## 2014-08-15 NOTE — Transfer of Care (Signed)
Immediate Anesthesia Transfer of Care Note  Patient: Patrick Chapman.  Procedure(s) Performed: Procedure(s) with comments: ANTERIOR CERVICAL DECOMPRESSION/DISCECTOMY FUSION 1 LEVEL (N/A) - C56 anterior cervical fusion with interbody prosthesis plating and bonegraft C 5-6  Patient Location: PACU  Anesthesia Type:General  Level of Consciousness: awake, alert  and oriented  Airway & Oxygen Therapy: Patient Spontanous Breathing and Patient connected to nasal cannula oxygen  Post-op Assessment: Report given to PACU RN and Post -op Vital signs reviewed and stable  Post vital signs: Reviewed and stable  Complications: No apparent anesthesia complications

## 2014-08-15 NOTE — Anesthesia Postprocedure Evaluation (Signed)
  Anesthesia Post-op Note  Patient: Patrick Chapman.  Procedure(s) Performed: Procedure(s) with comments: ANTERIOR CERVICAL DECOMPRESSION/DISCECTOMY FUSION 1 LEVEL (N/A) - C56 anterior cervical fusion with interbody prosthesis plating and bonegraft C 5-6  Patient Location: PACU  Anesthesia Type:General  Level of Consciousness: awake, alert  and oriented  Airway and Oxygen Therapy: Patient Spontanous Breathing and Patient connected to nasal cannula oxygen  Post-op Pain: mild  Post-op Assessment: Post-op Vital signs reviewed and Patient's Cardiovascular Status Stable  Post-op Vital Signs: Reviewed and stable  Last Vitals:  Filed Vitals:   08/15/14 1225  BP: 103/63  Pulse: 99  Temp: 36.6 C  Resp: 10    Complications: No apparent anesthesia complications

## 2014-08-15 NOTE — Progress Notes (Signed)
Pt and family given D/C instructions with Rx's, verbal understanding was provided. Pt's incision is clean and dry with no sign of infection. Incision is soft with no edema. Pt's IV was removed prior to D/C. Pt D/C'd home via wheelchair @ 1805 per MD order. Pt is stable @ D/C and has no other needs at this time. Holli Humbles, RN

## 2014-08-15 NOTE — H&P (Signed)
CC:  Neck and arm pain  HPI: Patrick Chapman is a 54 year old man followed in the outpatient clinic.  He was last seen a few weeks ago. He continued to have right-sided neck and arm pain consistent with a C6 radiculopathy.  He has undergone a course of physical therapy, and states that the pain is slightly better, however still continues to cause him a significant amount of discomfort, and prevents him from pursuing normal daily activities, as well as prevents her from sleeping.  He is here to discuss surgical options, as the physical therapy has not provided sufficient amount of relief.   PMH: Past Medical History  Diagnosis Date  . GERD (gastroesophageal reflux disease)   . Hypertension   . Obesity   . Sleep apnea     was tested, but had lap band surgery and with weight loss, no problems    PSH: Past Surgical History  Procedure Laterality Date  . Laparoscopic gastric banding  2009    Dr. Hassell Done  . Cholecystectomy    . Esophagogastroduodenoscopy  02/14/2011    DID NOT COMPLETE, was scheduled but for unknown reasons was not undertaken  . Colonoscopy with esophagogastroduodenoscopy (egd)  06/12/2012    Procedure: COLONOSCOPY WITH ESOPHAGOGASTRODUODENOSCOPY (EGD);  Surgeon: Daneil Dolin, MD;  Location: [facility];  Service: Endoscopy;  Laterality: N/A;  3:00 PM    SH: History  Substance Use Topics  . Smoking status: Never Smoker   . Smokeless tobacco: Current User    Types: Chew  . Alcohol Use: Yes     Comment: occassional    MEDS: Prior to Admission medications   Medication Sig Start Date End Date Taking? Authorizing Provider  aspirin 81 MG tablet Take 81 mg by mouth daily.   Yes Historical Provider, MD  AZOR 5-40 MG per tablet Take 1 tablet by mouth daily. 11/12/10  Yes Historical Provider, MD  dexlansoprazole (DEXILANT) 60 MG capsule Take 60 mg by mouth daily.  06/15/11  Yes Mahala Menghini, PA-C  loratadine (CLARITIN) 10 MG tablet Take 10 mg by mouth daily as needed for  allergies.   Yes Historical Provider, MD  Multiple Vitamins-Minerals (MULTIVITAMIN WITH MINERALS) tablet Take 1 tablet by mouth daily. Juice Plus   Yes Historical Provider, MD  peg 3350 powder (MOVIPREP) 100 G SOLR Take 1 kit (100 g total) by mouth as directed. Patient not taking: Reported on 08/04/2014 06/07/12   Daneil Dolin, MD    ALLERGY: Allergies  Allergen Reactions  . 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  There is also disc osteophyte complex on the right at C7-T1.  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  I do not believe the radiographic findings at C7-T1 are clinically symptomatically at this point.  He has failed conservative measures.  PLAN: - Proceed with surgical decompression at C5 C6 by ACDF.  At this point, I  told the patient that his remaining option was surgical decompression if his response to physical therapy and medications was not adequate.  The risks of surgery were discussed in detail with the patient which include but are not limited to spinal cord injury which may result in hand, leg, and bowel dysfunction, postoperative dysphagia, dysphonia, neck hematoma, or subsequent surgery for epidural hematoma.  The risk of CSF leak was also discussed. In addition, I explained to him that after spinal fusion surgery, there is a risk of adjacent level disease requiring  future surgical intervention, and that there is a possibility for continued pain after surgery. The patient understood our discussion as well as the risks of the surgery and is willing to proceed.  All questions were answered.

## 2014-08-15 NOTE — Discharge Summary (Signed)
  Physician Discharge Summary  Patient ID: Patrick Chapman. MRN: [id number] DOB/AGE: 11-Feb-1961 54 y.o.  Admit date: 08/15/2014 Discharge date: 08/15/2014  Admission Diagnoses: Cervical spondylosis C5-6  Discharge Diagnoses: Same Active Problems:   Cervical spondylosis with radiculopathy   Discharged Condition: Stable  Hospital Course:  [name]. is a 54 y.o. male electively admitted after ACDF. Postoperatively, the patient was at neurologic baseline. He was tolerating diet, ambulating independently, voiding normally, with pain controlled with oral medication.  Treatments: Surgery - ACDF C5-6  Discharge Exam: Blood pressure 103/63, pulse 99, temperature 97.8 F (36.6 C), temperature source Oral, resp. rate 10, height 6' (1.829 m), weight 138.347 kg (305 lb), SpO2 99 %. Awake, alert, oriented Speech fluent, appropriate CN grossly intact 5/5 BUE/BLE Wound c/d/i  Follow-up: Follow-up in my office [place] [facility] [phone]) in 2-3 weeks  Disposition: 01-Home or Self Care     Medication List    TAKE these medications        aspirin 81 MG tablet  Take 1 tablet (81 mg total) by mouth daily.  Start taking on:  08/29/2014     AZOR 5-40 MG per tablet  Generic drug:  amLODipine-olmesartan  Take 1 tablet by mouth daily.     dexlansoprazole 60 MG capsule  Commonly known as:  DEXILANT  Take 60 mg by mouth daily.     diazepam 5 MG tablet  Commonly known as:  VALIUM  Take 1 tablet (5 mg total) by mouth every 6 (six) hours as needed for anxiety.     loratadine 10 MG tablet  Commonly known as:  CLARITIN  Take 10 mg by mouth daily as needed for allergies.     multivitamin with minerals tablet  Take 1 tablet by mouth daily. Juice Plus     oxyCODONE-acetaminophen 10-325 MG per tablet  Commonly known as:  PERCOCET  Take 1 tablet by mouth every 4 (four) hours as needed for pain.     peg 3350 powder 100 G Solr  Commonly known as:  MOVIPREP   Take 1 kit (100 g total) by mouth as directed.         SignedConsuella Lose, C 08/15/2014, 12:44 PM

## 2014-08-15 NOTE — Op Note (Signed)
PREOP DIAGNOSIS: Cervical spondylosis with radiculopathy, C5-6  POSTOP DIAGNOSIS: Same  PROCEDURE: 1. Discectomy at C5-6 for decompression of spinal cord and exiting nerve roots  2. Placement of intervertebral biomechanical device: Zimmer PEEK 38mm lordotic cage 3. Placement of anterior instrumentation consisting of interbody plate and screws - 99JT Plate, 77mm screws x4  4. Use of morselized bone allograft  5. Arthrodesis C5-6, anterior interbody technique  6. Use of intraoperative microscope  SURGEON: Dr. Consuella Lose, MD  ASSISTANT: Dr. Earle Gell, MD  ANESTHESIA: General Endotracheal  EBL: 50cc  SPECIMENS: None  DRAINS: None  COMPLICATIONS: None immediate  CONDITION: Hemodynamically stable to PACU  HISTORY: Patrick Chapman. is a 54 y.o. y.o. male who initially presented to the outpatient clinic with signs and symptoms consistent with right-sided C6 radiculopathy. MRI demonstrated primarily foraminal stenosis at C5-6. Treatment options were discussed including surgical decompression. After all questions were answered, informed consent was obtained.  PROCEDURE IN DETAIL: The patient was brought to the operating room and transferred to the operative table. After induction of general anesthesia, the patient was positioned on the operative table in the supine position with all pressure points meticulously padded. The skin of the neck was then prepped and draped in the usual sterile fashion.  After timeout was conducted, the skin was infiltrated with local anesthetic. Skin incision was then made sharply and Bovie electrocautery was used to dissect the subcutaneous tissue until the platysma was identified. The platysma was then divided and undermined. The sternocleidomastoid muscle was then identified and, utilizing natural fascial planes in the neck, the prevertebral fascia was identified and the carotid sheath was retracted laterally and the trachea and esophagus retracted  medially. Again using fluoroscopy, the correct disc space was identified. Bovie electrocautery was used to dissect in the subperiosteal plane and elevate the bilateral longus coli muscles. Self-retaining retractors were then placed. At this point, the microscope was draped and brought into the field, and the remainder of the case was done under the microscope using microdissecting technique.  The disc space was incised sharply and rongeurs were use to initially complete a discectomy. The high-speed drill was then used to complete discectomy until the posterior annulus was identified and removed and the posterior longitudinal ligament was identified. Using a nerve hook, the PLL was elevated, and Kerrison rongeurs were used to remove the posterior longitudinal ligament and the ventral thecal sac was identified. Using a combination of curettes and rongeurs, complete decompression of the thecal sac and exiting nerve roots at this level was completed, and verified using micro-nerve hook.   Having completed our decompression, attention was turned to placement of the intervertebral device. Trial spacers were used to select a 18mm lordotic graft. This graft was then filled with morcellized allograft, and inserted under live fluoroscopy.  After placement of the intervertebral device, the above anterior cervical plate was selected, and placed across the interspace. Using a high-speed drill, the cortex of the cervical vertebral bodies was punctured, and screws inserted in the level above and below. Final fluoroscopic images in lateral projection was taken to confirm good hardware placement.  At this point, after all counts were verified to be correct, meticulous hemostasis was secured using a combination of bipolar electrocautery and passive hemostatics. The platysma muscle was then closed using interrupted 3-0 Vicryl sutures, and the skin was closed with an interrupted 3-0 Vicry subcuticular stitch. Dermabond and  sterile dressings were then applied and the drapes removed.  The patient tolerated the procedure well  and was extubated in the room and taken to the postanesthesia care unit in stable condition.

## 2014-08-15 NOTE — Anesthesia Procedure Notes (Signed)
Procedure Name: Intubation Date/Time: 08/15/2014 10:40 AM Performed by: Garner Nash Pre-anesthesia Checklist: Patient identified, Emergency Drugs available, Suction available, Patient being monitored and Timeout performed Patient Re-evaluated:Patient Re-evaluated prior to inductionOxygen Delivery Method: Circle system utilized Preoxygenation: Pre-oxygenation with 100% oxygen Intubation Type: IV induction Ventilation: Mask ventilation without difficulty and Two handed mask ventilation required Grade View: Grade I Tube type: Oral Tube size: 7.5 mm Number of attempts: 1 Airway Equipment and Method: Stylet and Video-laryngoscopy Placement Confirmation: ETT inserted through vocal cords under direct vision,  breath sounds checked- equal and bilateral,  positive ETCO2 and CO2 detector Secured at: 22 cm Tube secured with: Tape Dental Injury: Teeth and Oropharynx as per pre-operative assessment  Comments: Radiculopathy- elective glidescope

## 2014-08-18 ENCOUNTER — Encounter (HOSPITAL_COMMUNITY): Payer: Self-pay | Admitting: Neurosurgery

## 2014-08-21 ENCOUNTER — Encounter (HOSPITAL_COMMUNITY): Payer: Self-pay | Admitting: Internal Medicine

## 2015-06-24 ENCOUNTER — Ambulatory Visit (HOSPITAL_COMMUNITY)
Admission: RE | Admit: 2015-06-24 | Discharge: 2015-06-24 | Disposition: A | Discharge status: Home / Self Care | Payer: BC Managed Care – PPO | Source: Ambulatory Visit | Attending: Pulmonary Disease | Admitting: Pulmonary Disease

## 2015-06-24 ENCOUNTER — Other Ambulatory Visit (HOSPITAL_COMMUNITY): Payer: Self-pay | Admitting: Pulmonary Disease

## 2015-06-24 ENCOUNTER — Ambulatory Visit (INDEPENDENT_AMBULATORY_CARE_PROVIDER_SITE_OTHER): Payer: BC Managed Care – PPO | Admitting: Cardiology

## 2015-06-24 ENCOUNTER — Encounter: Payer: Self-pay | Admitting: *Deleted

## 2015-06-24 ENCOUNTER — Encounter: Payer: Self-pay | Admitting: Cardiology

## 2015-06-24 VITALS — BP 124/81 | HR 80 | Ht 72.0 in | Wt 284.0 lb

## 2015-06-24 DIAGNOSIS — R002 Palpitations: Secondary | ICD-10-CM

## 2015-06-24 DIAGNOSIS — Z136 Encounter for screening for cardiovascular disorders: Secondary | ICD-10-CM

## 2015-06-24 NOTE — Progress Notes (Addendum)
Patient ID: Patrick Chapman., male   DOB: [birth year], 54 y.o.   MRN: [id number]     Clinical Summary Mr. Chuba is a 55 y.o.male seen today as a new patient for the following medical problems.  1. Palpitations - palpitations started 1998. Increase frequency recently. - previous heart monitors done, he is unsure of results.  - currently with palpitations daily. Comes and goes throughout the day. No other associated symptoms - occas defaf coffee no tea, no sodas. Occasional beers or bourbon that can make symptoms worst   Past Medical History  Diagnosis Date  . GERD (gastroesophageal reflux disease)   . Hypertension   . Obesity   . Sleep apnea     was tested, but had lap band surgery and with weight loss, no problems     Allergies  Allergen Reactions  . Codeine Nausea And Vomiting and Rash     Current Outpatient Prescriptions  Medication Sig Dispense Refill  . aspirin 81 MG tablet Take 1 tablet (81 mg total) by mouth daily. 30 tablet   . AZOR 5-40 MG per tablet Take 1 tablet by mouth daily.    Marland Kitchen dexlansoprazole (DEXILANT) 60 MG capsule Take 60 mg by mouth daily.     . diazepam (VALIUM) 5 MG tablet Take 1 tablet (5 mg total) by mouth every 6 (six) hours as needed for anxiety. 30 tablet 0  . loratadine (CLARITIN) 10 MG tablet Take 10 mg by mouth daily as needed for allergies.    . Multiple Vitamins-Minerals (MULTIVITAMIN WITH MINERALS) tablet Take 1 tablet by mouth daily. Juice Plus    . oxyCODONE-acetaminophen (PERCOCET) 10-325 MG per tablet Take 1 tablet by mouth every 4 (four) hours as needed for pain. 30 tablet 0  . peg 3350 powder (MOVIPREP) 100 G SOLR Take 1 kit (100 g total) by mouth as directed. (Patient not taking: Reported on 08/04/2014) 1 kit 0   No current facility-administered medications for this visit.     Past Surgical History  Procedure Laterality Date  . Laparoscopic gastric banding  2009    Dr. Hassell Done  . Cholecystectomy    . Colonoscopy with  esophagogastroduodenoscopy (egd)  06/12/2012    Procedure: COLONOSCOPY WITH ESOPHAGOGASTRODUODENOSCOPY (EGD);  Surgeon: Daneil Dolin, MD;  [facility];  Service: Endoscopy;  Laterality: N/A;  3:00 PM  . Anterior cervical decomp/discectomy fusion N/A 08/15/2014    Procedure: ANTERIOR CERVICAL DECOMPRESSION/DISCECTOMY FUSION 1 LEVEL;  Surgeon: Consuella Lose, MD;  Location: [place] [facility];  Service: Neurosurgery;  Laterality: N/A;  C56 anterior cervical fusion with interbody prosthesis plating and bonegraft C 5-6     Allergies  Allergen Reactions  . Codeine Nausea And Vomiting and Rash      Family History  Problem Relation Age of Onset  . Heart attack Mother     living  . Heart attack Father     living  . Diabetes Father   . Colon cancer Neg Hx      Social History Mr. Wilczak reports that he has never smoked. His smokeless tobacco use includes Chew. Mr. Rosencrans reports that he drinks alcohol.   Review of Systems CONSTITUTIONAL: No weight loss, fever, chills, weakness or fatigue.  HEENT: Eyes: No visual loss, blurred vision, double vision or yellow sclerae.No hearing loss, sneezing, congestion, runny nose or sore throat.  SKIN: No rash or itching.  CARDIOVASCULAR: per HPI RESPIRATORY: No shortness of breath, cough or sputum.  GASTROINTESTINAL: No anorexia, nausea, vomiting or diarrhea.  No abdominal pain or blood.  GENITOURINARY: No burning on urination, no polyuria NEUROLOGICAL: No headache, dizziness, syncope, paralysis, ataxia, numbness or tingling in the extremities. No change in bowel or bladder control.  MUSCULOSKELETAL: No muscle, back pain, joint pain or stiffness.  LYMPHATICS: No enlarged nodes. No history of splenectomy.  PSYCHIATRIC: No history of depression or anxiety.  ENDOCRINOLOGIC: No reports of sweating, cold or heat intolerance. No polyuria or polydipsia.  .   Physical Examination Filed Vitals:   06/24/15 0928  BP: 124/81  Pulse: 80   Filed  Vitals:   06/24/15 0928  Height: 6' (1.829 m)  Weight: 284 lb (128.822 kg)    Gen: resting comfortably, no acute distress HEENT: no scleral icterus, pupils equal round and reactive, no palptable cervical adenopathy,  CV: RRR, no m/r/g, no jvd Resp: Clear to auscultation bilaterally GI: abdomen is soft, non-tender, non-distended, normal bowel sounds, no hepatosplenomegaly MSK: extremities are warm, no edema.  Skin: warm, no rash Neuro:  no focal deficits Psych: appropriate affect   Diagnostic Studies 10/2009 echo Study Conclusions  - Left ventricle: The cavity size was normal. Wall thickness was  increased in a pattern of mild LVH. Systolic function was normal.  The estimated ejection fraction was in the range of 55% to 60%.  Wall motion was normal; there were no regional wall motion  abnormalities. - Left atrium: The atrium was mildly dilated.    Assessment and Plan  1. Palpitations - he already has a holter monitor ordered, we will f/u results - counseled to cut back on caffene and EtOH    F/u 6 weeks    Jonathan F. Branch, M.D.  

## 2015-06-24 NOTE — Patient Instructions (Signed)
Continue all current medications. Keep monitor appointment for this afternoon. Follow up in  6 weeks.

## 2015-06-26 ENCOUNTER — Telehealth: Payer: Self-pay | Admitting: *Deleted

## 2015-06-26 NOTE — Telephone Encounter (Signed)
Pt asking for results of holter monitor (in EPIC) not resulted on yet. Pt is scheduled to go out of town Sunday and would like to know results before he left. Will forward to Dr. Harl Bowie

## 2015-06-26 NOTE — Telephone Encounter (Signed)
Pt aware and will have cell phone available while out of town for results call.

## 2015-06-26 NOTE — Telephone Encounter (Signed)
The results are still being processed, we will let him know as soon as we have them available  Zandra Abts MD

## 2015-07-07 ENCOUNTER — Telehealth: Payer: Self-pay | Admitting: *Deleted

## 2015-07-07 NOTE — Telephone Encounter (Signed)
RE: holter monitor results  Received: 4 days ago   Arnoldo Lenis, MD  Laurine Blazer, LPN    I spoke with patient and updated him on the results. He will keep his f/u with me in January   J Branch MD    ====================================================================    Previous Messages    ----- Message -----   From: Laurine Blazer, LPN   Sent: [id number]  4:25 PM    To: Arnoldo Lenis, MD  Subject: holter monitor results              Wife calling on Wednesday, 07/01/15 wanting monitor results. I informed patient that we had not notified her yet due to the fact that Dr. Luan Pulling was the ordering provider on test so the results will go back to him first.    Explained to wife that I would send you message to review test on Monday. Results are under CV Procedure. Looks like you actually read the report on 06/30/2015.    They are very anxious to get the results.   Thanks,   Edd Fabian

## 2015-08-17 ENCOUNTER — Ambulatory Visit: Payer: BC Managed Care – PPO | Admitting: Cardiology

## 2015-08-17 NOTE — Progress Notes (Unsigned)
Patient ID: Patrick Barnish., male   DOB: [birth year], 55 y.o.   MRN: [id number]     Clinical Summary Patrick Chapman is a 55 y.o.male seen today for follow up of the following medical problems.   1. Palpitations - palpitations started 1998. Increase frequency recently. - previous heart monitors done, he is unsure of results.  - currently with palpitations daily. Comes and goes throughout the day. No other associated symptoms - occas defaf coffee no tea, no sodas. Occasional beers or bourbon that can make symptoms worst  - home monitor 06/2015 without significant arrhythmias, primarily sinus rhythm with just occasional PVCs.  Past Medical History  Diagnosis Date  . GERD (gastroesophageal reflux disease)   . Hypertension   . Obesity   . Sleep apnea     was tested, but had lap band surgery and with weight loss, no problems     Allergies  Allergen Reactions  . Codeine Nausea And Vomiting and Rash     Current Outpatient Prescriptions  Medication Sig Dispense Refill  . aspirin 81 MG tablet Take 1 tablet (81 mg total) by mouth daily. 30 tablet   . AZOR 5-40 MG per tablet Take 1 tablet by mouth daily.    Marland Kitchen dexlansoprazole (DEXILANT) 60 MG capsule Take 60 mg by mouth daily.     Marland Kitchen loratadine (CLARITIN) 10 MG tablet Take 10 mg by mouth daily as needed for allergies.    . Multiple Vitamins-Minerals (MULTIVITAMIN WITH MINERALS) tablet Take 1 tablet by mouth daily. Juice Plus     No current facility-administered medications for this visit.     Past Surgical History  Procedure Laterality Date  . Laparoscopic gastric banding  2009    Dr. Hassell Done  . Cholecystectomy    . Colonoscopy with esophagogastroduodenoscopy (egd)  06/12/2012    Procedure: COLONOSCOPY WITH ESOPHAGOGASTRODUODENOSCOPY (EGD);  Surgeon: Daneil Dolin, MD;  [facility];  Service: Endoscopy;  Laterality: N/A;  3:00 PM  . Anterior cervical decomp/discectomy fusion N/A 08/15/2014    Procedure: ANTERIOR CERVICAL  DECOMPRESSION/DISCECTOMY FUSION 1 LEVEL;  Surgeon: Consuella Lose, MD;  Location: [place] [facility];  Service: Neurosurgery;  Laterality: N/A;  C56 anterior cervical fusion with interbody prosthesis plating and bonegraft C 5-6     Allergies  Allergen Reactions  . Codeine Nausea And Vomiting and Rash      Family History  Problem Relation Age of Onset  . Heart attack Mother     living  . Heart attack Father     living  . Diabetes Father   . Colon cancer Neg Hx      Social History Patrick Chapman reports that he quit smoking about 28 years ago. His smoking use included Cigarettes. He started smoking about 38 years ago. He has a 10 pack-year smoking history. His smokeless tobacco use includes Snuff. Patrick Chapman reports that he drinks alcohol.   Review of Systems CONSTITUTIONAL: No weight loss, fever, chills, weakness or fatigue.  HEENT: Eyes: No visual loss, blurred vision, double vision or yellow sclerae.No hearing loss, sneezing, congestion, runny nose or sore throat.  SKIN: No rash or itching.  CARDIOVASCULAR:  RESPIRATORY: No shortness of breath, cough or sputum.  GASTROINTESTINAL: No anorexia, nausea, vomiting or diarrhea. No abdominal pain or blood.  GENITOURINARY: No burning on urination, no polyuria NEUROLOGICAL: No headache, dizziness, syncope, paralysis, ataxia, numbness or tingling in the extremities. No change in bowel or bladder control.  MUSCULOSKELETAL: No muscle, back pain, joint pain or stiffness.  LYMPHATICS: No enlarged nodes. No history of splenectomy.  PSYCHIATRIC: No history of depression or anxiety.  ENDOCRINOLOGIC: No reports of sweating, cold or heat intolerance. No polyuria or polydipsia.  Marland Kitchen   Physical Examination There were no vitals filed for this visit. There were no vitals filed for this visit.  Gen: resting comfortably, no acute distress HEENT: no scleral icterus, pupils equal round and reactive, no palptable cervical adenopathy,  CV Resp: Clear to  auscultation bilaterally GI: abdomen is soft, non-tender, non-distended, normal bowel sounds, no hepatosplenomegaly MSK: extremities are warm, no edema.  Skin: warm, no rash Neuro:  no focal deficits Psych: appropriate affect   Diagnostic Studies 10/2009 echo Study Conclusions  - Left ventricle: The cavity size was normal. Wall thickness was  increased in a pattern of mild LVH. Systolic function was normal.  The estimated ejection fraction was in the range of 55% to 60%.  Wall motion was normal; there were no regional wall motion  abnormalities. - Left atrium: The atrium was mildly dilated.  06/2015 Holter monitor results  Predominant rhythm is normal sinus  There is rate ventricular ectopy, no supraventricular ectopy  Max HR 127, min HR 50, Avg HF 78  Symptoms of palpitations correspond with normal sinus rhythm with just occasional isolated PVCs.  No significant arrhythmias  Assessment and Plan   1. Palpitations - he already has a holter monitor ordered, we will f/u results - counseled to cut back on caffene and EtOH      Arnoldo Lenis, M.D., F.A.C.C.

## 2015-09-15 ENCOUNTER — Ambulatory Visit (INDEPENDENT_AMBULATORY_CARE_PROVIDER_SITE_OTHER): Payer: BC Managed Care – PPO | Admitting: Cardiology

## 2015-09-15 ENCOUNTER — Encounter: Payer: Self-pay | Admitting: Cardiology

## 2015-09-15 VITALS — BP 106/71 | HR 100 | Ht 72.0 in | Wt 303.4 lb

## 2015-09-15 DIAGNOSIS — R002 Palpitations: Secondary | ICD-10-CM | POA: Diagnosis not present

## 2015-09-15 NOTE — Patient Instructions (Signed)
Your physician recommends that you schedule a follow-up appointment AS NEEDED WITH DR. BRANCH  Your physician recommends that you continue on your current medications as directed. Please refer to the Current Medication list given to you today.  Thank you for choosing [facility]!!   

## 2015-09-15 NOTE — Progress Notes (Signed)
Patient ID: Othniel Urciuoli., male   DOB: 1961-08-02, 55 y.o.   MRN: [id number]     Clinical Summary Mr. Seckinger is a 55 y.o.male seen today for follow up of the following medical problems.    1. Palpitations - 06/2015 holter monitor with primarily NSR, occasional isolated PVCs.  - since last visit he has cut back on alcohol, since that time palpitations significantly improved   Past Medical History  Diagnosis Date  . GERD (gastroesophageal reflux disease)   . Hypertension   . Obesity   . Sleep apnea     was tested, but had lap band surgery and with weight loss, no problems     Allergies  Allergen Reactions  . Codeine Nausea And Vomiting and Rash     Current Outpatient Prescriptions  Medication Sig Dispense Refill  . aspirin 81 MG tablet Take 1 tablet (81 mg total) by mouth daily. 30 tablet   . AZOR 5-40 MG per tablet Take 1 tablet by mouth daily.    Marland Kitchen dexlansoprazole (DEXILANT) 60 MG capsule Take 60 mg by mouth daily.     Marland Kitchen loratadine (CLARITIN) 10 MG tablet Take 10 mg by mouth daily as needed for allergies.    . Multiple Vitamins-Minerals (MULTIVITAMIN WITH MINERALS) tablet Take 1 tablet by mouth daily. Juice Plus     No current facility-administered medications for this visit.     Past Surgical History  Procedure Laterality Date  . Laparoscopic gastric banding  2009    Dr. Hassell Done  . Cholecystectomy    . Colonoscopy with esophagogastroduodenoscopy (egd)  06/12/2012    Procedure: COLONOSCOPY WITH ESOPHAGOGASTRODUODENOSCOPY (EGD);  Surgeon: Daneil Dolin, MD;  [facility];  Service: Endoscopy;  Laterality: N/A;  3:00 PM  . Anterior cervical decomp/discectomy fusion N/A 08/15/2014    Procedure: ANTERIOR CERVICAL DECOMPRESSION/DISCECTOMY FUSION 1 LEVEL;  Surgeon: Consuella Lose, MD;  Location: [place] [facility];  Service: Neurosurgery;  Laterality: N/A;  C56 anterior cervical fusion with interbody prosthesis plating and bonegraft C 5-6     Allergies    Allergen Reactions  . Codeine Nausea And Vomiting and Rash      Family History  Problem Relation Age of Onset  . Heart attack Mother     living  . Heart attack Father     living  . Diabetes Father   . Colon cancer Neg Hx      Social History Mr. Chapmon reports that he quit smoking about 28 years ago. His smoking use included Cigarettes. He started smoking about 38 years ago. He has a 10 pack-year smoking history. His smokeless tobacco use includes Snuff. Mr. Rueff reports that he drinks alcohol.   Review of Systems CONSTITUTIONAL: No weight loss, fever, chills, weakness or fatigue.  HEENT: Eyes: No visual loss, blurred vision, double vision or yellow sclerae.No hearing loss, sneezing, congestion, runny nose or sore throat.  SKIN: No rash or itching.  CARDIOVASCULAR: no chest pain, no palpitations.  RESPIRATORY: No shortness of breath, cough or sputum.  GASTROINTESTINAL: No anorexia, nausea, vomiting or diarrhea. No abdominal pain or blood.  GENITOURINARY: No burning on urination, no polyuria NEUROLOGICAL: No headache, dizziness, syncope, paralysis, ataxia, numbness or tingling in the extremities. No change in bowel or bladder control.  MUSCULOSKELETAL: No muscle, back pain, joint pain or stiffness.  LYMPHATICS: No enlarged nodes. No history of splenectomy.  PSYCHIATRIC: No history of depression or anxiety.  ENDOCRINOLOGIC: No reports of sweating, cold or heat intolerance. No polyuria or  polydipsia.  Marland Kitchen   Physical Examination Filed Vitals:   09/15/15 1632  BP: 106/71  Pulse: 100   Filed Vitals:   09/15/15 1632  Height: 6' (1.829 m)  Weight: 303 lb 6.4 oz (137.621 kg)    Gen: resting comfortably, no acute distress HEENT: no scleral icterus, pupils equal round and reactive, no palptable cervical adenopathy,  CV: RRR, no m/r/g, no jvd  Resp: Clear to auscultation bilaterally GI: abdomen is soft, non-tender, non-distended, normal bowel sounds, no  hepatosplenomegaly MSK: extremities are warm, no edema.  Skin: warm, no rash Neuro:  no focal deficits Psych: appropriate affect   Diagnostic Studies 10/2009 echo Study Conclusions  - Left ventricle: The cavity size was normal. Wall thickness was  increased in a pattern of mild LVH. Systolic function was normal.  The estimated ejection fraction was in the range of 55% to 60%.  Wall motion was normal; there were no regional wall motion  abnormalities. - Left atrium: The atrium was mildly dilated.   06/2015 Holter monitor  Predominant rhythm is normal sinus  There is rate ventricular ectopy, no supraventricular ectopy  Max HR 127, min HR 50, Avg HF 78  Symptoms of palpitations correspond with normal sinus rhythm with just occasional isolated PVCs.  No significant arrhythmias  Assessment and Plan   1. Palpitations - holter without significant arrhythmias. Symptoms significantly improved with decreased alcohol - no further testing at this time, he may follow up as needed.          Arnoldo Lenis, M.D.

## 2015-11-16 ENCOUNTER — Telehealth: Payer: Self-pay

## 2015-11-16 MED ORDER — METOPROLOL TARTRATE 25 MG PO TABS: 25.0000 mg | ORAL_TABLET | Freq: Two times a day (BID) | ORAL | Status: DC

## 2015-11-16 NOTE — Telephone Encounter (Signed)
Pt made aware. Sent in RX to [facility].

## 2015-11-16 NOTE — Telephone Encounter (Signed)
Can take lopressor 25mg  bid prn palpitations.   Zandra Abts MD

## 2015-11-16 NOTE — Telephone Encounter (Signed)
Alcohol can irritate the heart and cause extra heart beats that can cause his symptoms, this is very common. The easiest management is to continue to limit alchol, the only other alternative is to start a medication. Which is he in favor of?    Zandra Abts MD

## 2015-11-16 NOTE — Telephone Encounter (Signed)
Pt called complaining of palpitations since last Tuesday after drinking a few beers. He stated that each time he has just a few beers it causes him to have palpitations but normally does not last this long. He does not drink any caffeine. He stated that he does sleep well and has not had any extra stress lately. His heart rate has been normal  ( below 100 when active, 70's when at rest- as he wears a fitbit continually) It is not continuous but it is enough to worry patient. He is not currently taking any medications for this problem.  Please advise.

## 2015-11-16 NOTE — Telephone Encounter (Signed)
He would like to try to take a medication as needed if at all possible just for when this occurs as he does not drink a lot, but he does enjoy an occasional beer .  Please advise.

## 2016-02-17 ENCOUNTER — Telehealth: Payer: Self-pay | Admitting: *Deleted

## 2016-02-17 NOTE — Telephone Encounter (Signed)
Patient notified.  Stated that he would just take the Lopressor 25mg  twice a day till next week & call back with update.  Did not want to increase dose yet as he would like to give the initial dose a chance first.  If it does not help, he will increase to the 50mg  twice a day.  He will call back next Tuesday with update.

## 2016-02-17 NOTE — Telephone Encounter (Signed)
Palpitations really bad the last 2-3 days.  Drinking no alcohol.  Was taking the Lopressor as needed previously, but started BID yesterday.   Has had at least one a day for a while now.  No other c/o chest pain, SOB, or dizziness.  Will fwd message to provider for further advice.

## 2016-02-17 NOTE — Telephone Encounter (Signed)
If symptosm still ongoing can increase lopressor to 50mg  in AM and 50mg  in PM. THis is still a fairly low dose, but hopefully will help prevent some of the symptoms. Have him call back in a few days to update [place]. Previous heart monitor showed only occasional extra heart beats, no significant abnormal rhyhtms, hopefully increased metoprolol will help  Zandra Abts MD

## 2016-02-19 MED ORDER — METOPROLOL TARTRATE 50 MG PO TABS: 50.0000 mg | ORAL_TABLET | Freq: Two times a day (BID) | ORAL | Status: DC

## 2016-02-19 NOTE — Telephone Encounter (Signed)
Pt called back and wants to increase metoprolol 50 mg bid. Requesting rx sent to [facility]. Medication sent to pharmacy.

## 2016-02-19 NOTE — Telephone Encounter (Signed)
Pt called back to say that he has been taking Lopressor 50 mg bid for 2 days and hasn't helped at this point still having palpitations

## 2016-02-19 NOTE — Addendum Note (Signed)
Addended by: Julian Hy T on: 02/19/2016 01:35 PM   Modules accepted: Orders, Medications

## 2016-02-22 ENCOUNTER — Encounter: Payer: Self-pay | Admitting: Adult Health

## 2016-02-22 ENCOUNTER — Ambulatory Visit (INDEPENDENT_AMBULATORY_CARE_PROVIDER_SITE_OTHER): Payer: BC Managed Care – PPO | Admitting: Adult Health

## 2016-02-22 VITALS — BP 120/82 | HR 77 | Ht 72.0 in | Wt 316.0 lb

## 2016-02-22 DIAGNOSIS — I10 Essential (primary) hypertension: Secondary | ICD-10-CM | POA: Diagnosis not present

## 2016-02-22 DIAGNOSIS — R002 Palpitations: Secondary | ICD-10-CM

## 2016-02-22 MED ORDER — AMLODIPINE-OLMESARTAN 5-20 MG PO TABS: 1.0000 | ORAL_TABLET | Freq: Every day | ORAL | Status: DC

## 2016-02-22 NOTE — Progress Notes (Signed)
Cardiology Office Note   Date:  02/22/2016   ID:  Patrick Browns., DOB [birth year], MRN [id number]  PCP:  Alonza Bogus, MD  Cardiologist: Victorio Palm, NP   No chief complaint on file.     History of Present Illness: Patrick Kenan. is a 55 y.o. male normally seen in the [place] office we're following for palpitations. The patient wore a Holter monitor 11 2016 which revealed primarily normal sinus rhythm and occasional isolated PVCs. The patient was started on metoprolol 25 mg twice a day on last office visit with Dr. Harl Bowie on 09/25/2015, but called our office on 04//2017 with complaints of increased palpitations. His metoprolol was increased to 50 mg twice a day. He called back on 7/12/2017and stated that he took the medication for 2 days has had no relief from palpitations.he has been admitted to my schedule to evaluate further.  He is accompanied by his wife who states that he is under a lot of stress with his job. He also states he is not sleeping well. He did have a sleep study done in 2009 prior to having a lap band surgery, was told he did have sleep apnea, but did not followup to have CPAP.  The patient continues on Dexilant for GERD symptoms. He is been taking metoprolol 50 mg twice a day for about a week. He had only been taking it when necessary. His wife states that he also continues to dip snuff. He does not drink alcohol or caffeine.  Past Medical History  Diagnosis Date  . GERD (gastroesophageal reflux disease)   . Hypertension   . Obesity   . Sleep apnea     was tested, but had lap band surgery and with weight loss, no problems    Past Surgical History  Procedure Laterality Date  . Laparoscopic gastric banding  2009    Dr. Hassell Done  . Cholecystectomy    . Colonoscopy with esophagogastroduodenoscopy (egd)  06/12/2012    Procedure: COLONOSCOPY WITH ESOPHAGOGASTRODUODENOSCOPY (EGD);  Surgeon: Daneil Dolin, MD;  Location: [facility];  Service: Endoscopy;   Laterality: N/A;  3:00 PM  . Anterior cervical decomp/discectomy fusion N/A 08/15/2014    Procedure: ANTERIOR CERVICAL DECOMPRESSION/DISCECTOMY FUSION 1 LEVEL;  Surgeon: Consuella Lose, MD;  Location: [facility];  Service: Neurosurgery;  Laterality: N/A;  C56 anterior cervical fusion with interbody prosthesis plating and bonegraft C 5-6     Current Outpatient Prescriptions  Medication Sig Dispense Refill  . aspirin 81 MG tablet Take 1 tablet (81 mg total) by mouth daily. 30 tablet   . dexlansoprazole (DEXILANT) 60 MG capsule Take 60 mg by mouth 2 (two) times daily.     Marland Kitchen loratadine (CLARITIN) 10 MG tablet Take 10 mg by mouth daily as needed for allergies.    . Magnesium 200 MG TABS Take 200 mg by mouth 2 (two) times daily.    . metoprolol (LOPRESSOR) 50 MG tablet Take 1 tablet (50 mg total) by mouth 2 (two) times daily. 180 tablet 3  . Multiple Vitamins-Minerals (MULTIVITAMIN WITH MINERALS) tablet Take 1 tablet by mouth daily. Juice Plus    . amLODipine-olmesartan (AZOR) 5-20 MG tablet Take 1 tablet by mouth daily. 90 tablet 3   No current facility-administered medications for this visit.    Allergies:   Codeine    Social History:  The patient  reports that he quit smoking about 28 years ago. His smoking use included Cigarettes. He started smoking about 38 years ago.  He has a 10 pack-year smoking history. His smokeless tobacco use includes Snuff. He reports that he drinks alcohol. He reports that he does not use illicit drugs.   Family History:  The patient's family history includes Diabetes in his father; Heart attack in his father and mother. There is no history of Colon cancer.    ROS: All other systems are reviewed and negative. Unless otherwise mentioned in H&P    PHYSICAL EXAM: VS:  BP 120/82 mmHg  Pulse 77  Ht 6' (1.829 m)  Wt 316 lb (143.337 kg)  BMI 42.85 kg/m2  SpO2 93% , BMI Body mass index is 42.85 kg/(m^2). GEN: Well nourished, well developed, in no acute  distressmorbidly obese HEENT: normal Neck: no JVD, carotid bruits, or masses Cardiac: RRR; occasional extra systole,ops,no edema  Respiratory:  clear to auscultation bilaterally, normal work of breathing GI: soft, nontender, nondistended, + BS MS: no deformity or atrophy Skin: warm and dry, no rash Neuro:  Strength and sensation are intact Psych: euthymic mood, full affect   EKG:  The ekg ordered today demonstrates sinus rhythm, rate of 66 beats per minute.   Recent Labs: No results found for requested labs within last 365 days.    Lipid Panel    Component Value Date/Time   CHOL 184 07/04/2007   TRIG 146 07/04/2007   HDL 35 07/04/2007   LDLCALC 120 07/04/2007      Wt Readings from Last 3 Encounters:  02/22/16 316 lb (143.337 kg)  09/15/15 303 lb 6.4 oz (137.621 kg)  06/24/15 284 lb (128.822 kg)     ASSESSMENT AND PLAN:  1. Frequent palpitations: he started back taking Lopressor 50 mg twice a day and has been doing so for a week. He states that this does help to decrease but not limiting his frequent palpitations. Blood pressure is very soft today at 106/71. I think from the increased dose of Lopressor. On going to check a magnesium, and he BMET to evaluate for hypomagnesemia on in the setting of PPI use. I will begin him on magnesium 200 mg by mouth twice a day in the interim. He is advised to cut down on snuff. I have also asked him to do his best to become more active as this will help to reduce his stress.  2. Hypertension Due soft blood pressure, I will decrease his Azor from 5/40 mg daily to 5/20 mg daily as he is having episodes of mild dizziness with position change. I have given him a blood pressure recording sheet and asked him to take his blood pressure daily and bring it back with him when he sees [place] again.   3. Morbid obesity:may be contributing to obstructive sleep apnea. Advised him to followup with his primary care physician about either repeating it or  talking with him about oxygen support overnight which will help to decrease and palpitations as well. I've advised him to increase his activity to help to reduce his stress and lose weight.   Current medicines are reviewed at length with the patient today.    Labs/ tests ordered today include: BMET, magnesium  Orders Placed This Encounter  Procedures  . Magnesium  . Basic Metabolic Panel (BMET)  . EKG 12-Lead     Disposition:   FU with 2 weeks  Signed, Jory Sims, NP  02/22/2016 3:08 PM    [facility] [address] [place], [place] [address] Phone: [phone]; Fax: [phone]

## 2016-02-22 NOTE — Patient Instructions (Addendum)
Your physician recommends that you schedule a follow-up appointment in:  2 weeks   Get lab work now [place] to 5-20 mg daily   Start Magnesium 200 mg twice a day     Thank you for choosing [facility] !

## 2016-02-22 NOTE — Progress Notes (Signed)
Name: Patrick Chapman.    DOB: [birth year]  Age: 55 y.o.  MR#: [id number]       PCP:  Alonza Bogus, MD      Insurance: Payor: [employer] / Plan: [employer] PPO / Product Type: *No Product type* /   CC:   No chief complaint on file.   VS Filed Vitals:   02/22/16 1418  Height: 6' (1.829 m)  Weight: 316 lb (143.337 kg)    Weights Current Weight  02/22/16 316 lb (143.337 kg)  09/15/15 303 lb 6.4 oz (137.621 kg)  06/24/15 284 lb (128.822 kg)    Blood Pressure  BP Readings from Last 3 Encounters:  09/15/15 106/71  06/24/15 124/81  08/15/14 113/84     Admit date:  (Not on file) Last encounter with RMR:  Visit date not found   Allergy Codeine  Current Outpatient Prescriptions  Medication Sig Dispense Refill  . aspirin 81 MG tablet Take 1 tablet (81 mg total) by mouth daily. 30 tablet   . AZOR 5-40 MG per tablet Take 1 tablet by mouth daily.    Marland Kitchen dexlansoprazole (DEXILANT) 60 MG capsule Take 60 mg by mouth 2 (two) times daily.     Marland Kitchen loratadine (CLARITIN) 10 MG tablet Take 10 mg by mouth daily as needed for allergies.    . metoprolol (LOPRESSOR) 50 MG tablet Take 1 tablet (50 mg total) by mouth 2 (two) times daily. 180 tablet 3  . Multiple Vitamins-Minerals (MULTIVITAMIN WITH MINERALS) tablet Take 1 tablet by mouth daily. Juice Plus     No current facility-administered medications for this visit.    Discontinued Meds:   There are no discontinued medications.  Patient Active Problem List   Diagnosis Date Noted  . Cervical spondylosis with radiculopathy 08/15/2014  . LUQ abdominal pain 01/14/2011  . GERD (gastroesophageal reflux disease) 11/25/2010  . Epigastric pain 11/25/2010  . HYPERLIPIDEMIA 10/16/2009  . CHEST PAIN UNSPECIFIED 10/16/2009  . OVERWEIGHT 10/13/2009  . HYPERTENSION 10/13/2009    LABS    Component Value Date/Time   NA 142 08/05/2014 0946   NA 139 11/19/2010 2136   NA 136 10/09/2007 1025   K 4.6 08/05/2014 0946   K 4.5  11/19/2010 2136   K 4.2 10/09/2007 1025   CL 104 08/05/2014 0946   CL 104 11/19/2010 2136   CL 99 10/09/2007 1025   CO2 28 08/05/2014 0946   CO2 28 11/19/2010 2136   CO2 27 10/09/2007 1025   GLUCOSE 117* 08/05/2014 0946   GLUCOSE 106* 11/19/2010 2136   GLUCOSE 89 10/09/2007 1025   BUN 16 08/05/2014 0946   BUN 13 11/19/2010 2136   BUN 18 10/09/2007 1025   CREATININE 1.26 08/15/2014 1500   CREATININE 1.25 08/05/2014 0946   CREATININE 1.29 11/19/2010 2136   CALCIUM 9.4 08/05/2014 0946   CALCIUM 9.6 11/19/2010 2136   CALCIUM 9.3 10/09/2007 1025   GFRNONAA 64* 08/15/2014 1500   GFRNONAA 65* 08/05/2014 0946   GFRNONAA 59* 11/19/2010 2136   GFRAA 74* 08/15/2014 1500   GFRAA 75* 08/05/2014 0946   GFRAA  11/19/2010 2136    >60        The eGFR has been calculated using the MDRD equation. This calculation has not been validated in all clinical situations. eGFR's persistently <60 mL/min signify possible Chronic Kidney Disease.   CMP     Component Value Date/Time   NA 142 08/05/2014 0946   K 4.6 08/05/2014 0946  CL 104 08/05/2014 0946   CO2 28 08/05/2014 0946   GLUCOSE 117* 08/05/2014 0946   BUN 16 08/05/2014 0946   CREATININE 1.26 08/15/2014 1500   CALCIUM 9.4 08/05/2014 0946   PROT 7.1 11/19/2010 2136   ALBUMIN 4.2 11/19/2010 2136   AST 17 11/19/2010 2136   ALT 19 11/19/2010 2136   ALKPHOS 49 11/19/2010 2136   BILITOT 0.8 11/19/2010 2136   GFRNONAA 64* 08/15/2014 1500   GFRAA 74* 08/15/2014 1500       Component Value Date/Time   WBC 9.7 08/15/2014 1500   WBC 7.5 08/05/2014 0946   WBC 8.9 11/19/2010 2136   HGB 14.3 08/15/2014 1500   HGB 15.2 08/05/2014 0946   HGB 15.3 11/19/2010 2136   HCT 44.3 08/15/2014 1500   HCT 46.8 08/05/2014 0946   HCT 46.1 11/19/2010 2136   MCV 93.7 08/15/2014 1500   MCV 94.0 08/05/2014 0946   MCV 93.9 11/19/2010 2136    Lipid Panel     Component Value Date/Time   CHOL 184 07/04/2007   TRIG 146 07/04/2007   HDL 35 07/04/2007    LDLCALC 120 07/04/2007    ABG No results found for: PHART, PCO2ART, PO2ART, HCO3, TCO2, ACIDBASEDEF, O2SAT   Lab Results  Component Value Date   TSH 2.617 07/04/2007   BNP (last 3 results) No results for input(s): BNP in the last 8760 hours.  ProBNP (last 3 results) No results for input(s): PROBNP in the last 8760 hours.  Cardiac Panel (last 3 results) No results for input(s): CKTOTAL, CKMB, TROPONINI, RELINDX in the last 72 hours.  Iron/TIBC/Ferritin/ %Sat No results found for: IRON, TIBC, FERRITIN, IRONPCTSAT   EKG Orders placed or performed in visit on 02/22/16  . EKG 12-Lead     Prior Assessment and Plan Problem List as of 02/22/2016      Cardiovascular and Mediastinum   HYPERTENSION     Digestive   GERD (gastroesophageal reflux disease)   Last Assessment & Plan 05/07/2012 Office Visit Written 05/09/2012  4:38 PM by Orvil Feil, NP    55 year old male with hx of lap band surgery in 2009, now with worsening of GERD symptoms. Please see the HPI for further details. Barium swallow last year showed adequate band placement. Has not followed up with Dr. Hassell Done recently. Has recently been ingesting large amounts of tea, coffee to stay awake at new job. However, with cessation of this, as not mentioned above, symptoms have improved. He is on Dexilant BID. Continues to have intermittent upper abdominal discomfort, radiating up to chest. Negative cardiac work-up in past.  I continue to offer and recommend an upper endoscopy due to refractory reflux symptoms, upper abdominal discomfort and chest discomfort. Although the band procedure can make reflux worse, this would not entirely explain his symptoms. He has also gained a considerable amount of weight back, worsening his symptoms as well. Dexilant BID has worked well for him. Differentials at this point include band too tight, reflux secondary to behavior and weight gain, reflux symptoms as a result of the band itself, and more  concerning possible band erosion.  I have asked him to decrease Dexilant to once a day, avoid reflux triggers, wt loss. He is to call me in 1 week. At this point, he is not thrilled about pursuing upper GI evaluation. I have discussed with him the importance of evaluation, and he would like to wait an additional week. At this time, he will decide on the EGD. At that time,  I have offered him an initial, average risk screening colonoscopy. He turns 51 the middle of October.   Pt agreed and stated understanding. I have asked that he also follow-up with Dr. Hassell Done in the near future.          Nervous and Auditory   Cervical spondylosis with radiculopathy     Other   HYPERLIPIDEMIA   OVERWEIGHT   CHEST PAIN UNSPECIFIED   Epigastric pain   Last Assessment & Plan 11/25/2010 Office Visit Written 11/25/2010 11:12 AM by Orvil Feil, NP    See GERD.      LUQ abdominal pain   Last Assessment & Plan 01/12/2011 Office Visit Written 01/14/2011  4:11 PM by Orvil Feil, NP    55 year old Caucasian male with hx of band placement in 2009 by Dr. Hassell Done with [facility] in [place]. Prior to operation had hx of reflux, yet resolved with wt loss. Now, has gradually noticed increasing reflux without improvement of removal of 1cc of fluid. He was started on Dexilant at last visit, as well as underwent an UGI to assess band placement. Dexilant appeared to alleviate symptoms for awhile, yet now reports LUQ pain radiating to left chest. Exacerbated by spicy foods, red onions. His symptoms are relieved by eating, yet return shortly thereafter. His UGI showed appropriate placement of band.  At this time, his continued LUQ pain despite PPI deserves assessment endoscopically to assess for gastritis, ulcer or even band erosion. I feel there is a component of non-compliance with dietary choices as well as portion sizes, which most definitely needs to be addressed.  I spoke in detail to pt about "stacking" food or  "force-feeding", as this is not appropriate s/p band surgery. I briefly broached the subject of possible band removal, as he is not complying with dietary recommendations at this time or losing weight, as well as experiencing worsening of reflux. He is approaching the max capacity of his band, although this is completely at Dr. Earlie Server discretion.   With his co-morbidities, he may be better served with removal of band and conversion to gastric bypass. Again, this is at the surgeon's discretion, and he will be cc'ed on this note. The pt certainly wants to achieve relief, and although not impossible, he may not be able to lose any further weight without drastic dietary and behavior changes.   We will proceed with EGD with Dr. Gala Romney in the near future. The R/B/A have been discussed in detail with the pt. He states understanding and agrees to proceed. Continue Dexilant Modify diet, avoid trigger foods Will contact Dr. Hassell Done; pt may need additional fluid removal to see if this helps alleviate some symptoms. I have strongly advised pt to return for f/u.           Imaging: No results found.

## 2016-02-23 LAB — BASIC METABOLIC PANEL
BUN: 16 mg/dL (ref 7–25)
CO2: 26 mmol/L (ref 20–31)
Calcium: 9.1 mg/dL (ref 8.6–10.3)
Chloride: 106 mmol/L (ref 98–110)
Creat: 1.28 mg/dL (ref 0.70–1.33)
Glucose, Bld: 82 mg/dL (ref 65–99)
Potassium: 4.7 mmol/L (ref 3.5–5.3)
Sodium: 142 mmol/L (ref 135–146)

## 2016-02-23 LAB — MAGNESIUM: Magnesium: 2.1 mg/dL (ref 1.5–2.5)

## 2016-03-04 ENCOUNTER — Encounter: Payer: Self-pay | Admitting: Adult Health

## 2016-03-04 ENCOUNTER — Ambulatory Visit (INDEPENDENT_AMBULATORY_CARE_PROVIDER_SITE_OTHER): Payer: BC Managed Care – PPO | Admitting: Adult Health

## 2016-03-04 VITALS — BP 110/76 | HR 77 | Ht 71.0 in | Wt 321.0 lb

## 2016-03-04 DIAGNOSIS — R002 Palpitations: Secondary | ICD-10-CM

## 2016-03-04 DIAGNOSIS — I10 Essential (primary) hypertension: Secondary | ICD-10-CM | POA: Diagnosis not present

## 2016-03-04 DIAGNOSIS — K219 Gastro-esophageal reflux disease without esophagitis: Secondary | ICD-10-CM | POA: Diagnosis not present

## 2016-03-04 NOTE — Progress Notes (Signed)
Name: Patrick Chapman.    DOB: [birth year]  Age: 55 y.o.  MR#: [id number]       PCP:  Fredirick Maudlin, MD      Insurance: Payor: BLUE CROSS BLUE SHIELD / Plan: [employer] PPO / Product Type: *No Product type* /   CC:   No chief complaint on file.   VS Vitals:   03/04/16 1535  Weight: (!) 321 lb (145.6 kg)  Height: 5\' 11"  (1.803 m)    Weights Current Weight  03/04/16 (!) 321 lb (145.6 kg)  02/22/16 (!) 316 lb (143.3 kg)  09/15/15 (!) 303 lb 6.4 oz (137.6 kg)    Blood Pressure  BP Readings from Last 3 Encounters:  02/22/16 120/82  09/15/15 106/71  06/24/15 124/81     Admit date:  (Not on file) Last encounter with RMR:  02/22/2016   Allergy Codeine  Current Outpatient Prescriptions  Medication Sig Dispense Refill  . amLODipine-olmesartan (AZOR) 5-20 MG tablet Take 1 tablet by mouth daily. 90 tablet 3  . aspirin 81 MG tablet Take 1 tablet (81 mg total) by mouth daily. 30 tablet   . dexlansoprazole (DEXILANT) 60 MG capsule Take 60 mg by mouth 2 (two) times daily.     02/24/2016 loratadine (CLARITIN) 10 MG tablet Take 10 mg by mouth daily as needed for allergies.    . Magnesium 200 MG TABS Take 200 mg by mouth 2 (two) times daily.    . metoprolol (LOPRESSOR) 50 MG tablet Take 1 tablet (50 mg total) by mouth 2 (two) times daily. 180 tablet 3  . Multiple Vitamins-Minerals (MULTIVITAMIN WITH MINERALS) tablet Take 1 tablet by mouth daily. Juice Plus     No current facility-administered medications for this visit.     Discontinued Meds:   There are no discontinued medications.  Patient Active Problem List   Diagnosis Date Noted  . Cervical spondylosis with radiculopathy 08/15/2014  . LUQ abdominal pain 01/14/2011  . GERD (gastroesophageal reflux disease) 11/25/2010  . Epigastric pain 11/25/2010  . HYPERLIPIDEMIA 10/16/2009  . CHEST PAIN UNSPECIFIED 10/16/2009  . OVERWEIGHT 10/13/2009  . HYPERTENSION 10/13/2009    LABS    Component Value Date/Time   NA 142 02/22/2016  1506   NA 142 08/05/2014 0946   NA 139 11/19/2010 2136   K 4.7 02/22/2016 1506   K 4.6 08/05/2014 0946   K 4.5 11/19/2010 2136   CL 106 02/22/2016 1506   CL 104 08/05/2014 0946   CL 104 11/19/2010 2136   CO2 26 02/22/2016 1506   CO2 28 08/05/2014 0946   CO2 28 11/19/2010 2136   GLUCOSE 82 02/22/2016 1506   GLUCOSE 117 (H) 08/05/2014 0946   GLUCOSE 106 (H) 11/19/2010 2136   BUN 16 02/22/2016 1506   BUN 16 08/05/2014 0946   BUN 13 11/19/2010 2136   CREATININE 1.28 02/22/2016 1506   CREATININE 1.26 08/15/2014 1500   CREATININE 1.25 08/05/2014 0946   CREATININE 1.29 11/19/2010 2136   CALCIUM 9.1 02/22/2016 1506   CALCIUM 9.4 08/05/2014 0946   CALCIUM 9.6 11/19/2010 2136   GFRNONAA 64 (L) 08/15/2014 1500   GFRNONAA 65 (L) 08/05/2014 0946   GFRNONAA 59 (L) 11/19/2010 2136   GFRAA 74 (L) 08/15/2014 1500   GFRAA 75 (L) 08/05/2014 0946   GFRAA  11/19/2010 2136    >60        The eGFR has been calculated using the MDRD equation. This calculation has not been validated in all clinical situations.  eGFR's persistently <60 mL/min signify possible Chronic Kidney Disease.   CMP     Component Value Date/Time   NA 142 02/22/2016 1506   K 4.7 02/22/2016 1506   CL 106 02/22/2016 1506   CO2 26 02/22/2016 1506   GLUCOSE 82 02/22/2016 1506   BUN 16 02/22/2016 1506   CREATININE 1.28 02/22/2016 1506   CALCIUM 9.1 02/22/2016 1506   PROT 7.1 11/19/2010 2136   ALBUMIN 4.2 11/19/2010 2136   AST 17 11/19/2010 2136   ALT 19 11/19/2010 2136   ALKPHOS 49 11/19/2010 2136   BILITOT 0.8 11/19/2010 2136   GFRNONAA 64 (L) 08/15/2014 1500   GFRAA 74 (L) 08/15/2014 1500       Component Value Date/Time   WBC 9.7 08/15/2014 1500   WBC 7.5 08/05/2014 0946   WBC 8.9 11/19/2010 2136   HGB 14.3 08/15/2014 1500   HGB 15.2 08/05/2014 0946   HGB 15.3 11/19/2010 2136   HCT 44.3 08/15/2014 1500   HCT 46.8 08/05/2014 0946   HCT 46.1 11/19/2010 2136   MCV 93.7 08/15/2014 1500   MCV 94.0  08/05/2014 0946   MCV 93.9 11/19/2010 2136    Lipid Panel     Component Value Date/Time   CHOL 184 07/04/2007   TRIG 146 07/04/2007   HDL 35 07/04/2007   LDLCALC 120 07/04/2007    ABG No results found for: PHART, PCO2ART, PO2ART, HCO3, TCO2, ACIDBASEDEF, O2SAT   Lab Results  Component Value Date   TSH 2.617 07/04/2007   BNP (last 3 results) No results for input(s): BNP in the last 8760 hours.  ProBNP (last 3 results) No results for input(s): PROBNP in the last 8760 hours.  Cardiac Panel (last 3 results) No results for input(s): CKTOTAL, CKMB, TROPONINI, RELINDX in the last 72 hours.  Iron/TIBC/Ferritin/ %Sat No results found for: IRON, TIBC, FERRITIN, IRONPCTSAT   EKG Orders placed or performed in visit on 02/22/16  . EKG 12-Lead     Prior Assessment and Plan Problem List as of 03/04/2016 Reviewed: 02/22/2016  3:13 PM by Jory Sims, NP     Cardiovascular and Mediastinum   HYPERTENSION     Digestive   GERD (gastroesophageal reflux disease)   Last Assessment & Plan 05/07/2012 Office Visit Written 05/09/2012  4:38 PM by Orvil Feil, NP    55 year old male with hx of lap band surgery in 2009, now with worsening of GERD symptoms. Please see the HPI for further details. Barium swallow last year showed adequate band placement. Has not followed up with Dr. Hassell Done recently. Has recently been ingesting large amounts of tea, coffee to stay awake at new job. However, with cessation of this, as not mentioned above, symptoms have improved. He is on Dexilant BID. Continues to have intermittent upper abdominal discomfort, radiating up to chest. Negative cardiac work-up in past.  I continue to offer and recommend an upper endoscopy due to refractory reflux symptoms, upper abdominal discomfort and chest discomfort. Although the band procedure can make reflux worse, this would not entirely explain his symptoms. He has also gained a considerable amount of weight back, worsening his  symptoms as well. Dexilant BID has worked well for him. Differentials at this point include band too tight, reflux secondary to behavior and weight gain, reflux symptoms as a result of the band itself, and more concerning possible band erosion.  I have asked him to decrease Dexilant to once a day, avoid reflux triggers, wt loss. He is to call me in 1  week. At this point, he is not thrilled about pursuing upper GI evaluation. I have discussed with him the importance of evaluation, and he would like to wait an additional week. At this time, he will decide on the EGD. At that time, I have offered him an initial, average risk screening colonoscopy. He turns 51 the middle of October.   Pt agreed and stated understanding. I have asked that he also follow-up with Dr. Hassell Done in the near future.          Nervous and Auditory   Cervical spondylosis with radiculopathy     Other   HYPERLIPIDEMIA   OVERWEIGHT   CHEST PAIN UNSPECIFIED   Epigastric pain   Last Assessment & Plan 11/25/2010 Office Visit Written 11/25/2010 11:12 AM by Orvil Feil, NP    See GERD.      LUQ abdominal pain   Last Assessment & Plan 01/12/2011 Office Visit Written 01/14/2011  4:11 PM by Orvil Feil, NP    55 year old Caucasian male with hx of band placement in 2009 by Dr. Hassell Done with [facility] in [place]. Prior to operation had hx of reflux, yet resolved with wt loss. Now, has gradually noticed increasing reflux without improvement of removal of 1cc of fluid. He was started on Dexilant at last visit, as well as underwent an UGI to assess band placement. Dexilant appeared to alleviate symptoms for awhile, yet now reports LUQ pain radiating to left chest. Exacerbated by spicy foods, red onions. His symptoms are relieved by eating, yet return shortly thereafter. His UGI showed appropriate placement of band.  At this time, his continued LUQ pain despite PPI deserves assessment endoscopically to assess for gastritis, ulcer or even  band erosion. I feel there is a component of non-compliance with dietary choices as well as portion sizes, which most definitely needs to be addressed.  I spoke in detail to pt about "stacking" food or "force-feeding", as this is not appropriate s/p band surgery. I briefly broached the subject of possible band removal, as he is not complying with dietary recommendations at this time or losing weight, as well as experiencing worsening of reflux. He is approaching the max capacity of his band, although this is completely at Dr. Earlie Server discretion.   With his co-morbidities, he may be better served with removal of band and conversion to gastric bypass. Again, this is at the surgeon's discretion, and he will be cc'ed on this note. The pt certainly wants to achieve relief, and although not impossible, he may not be able to lose any further weight without drastic dietary and behavior changes.   We will proceed with EGD with Dr. Gala Romney in the near future. The R/B/A have been discussed in detail with the pt. He states understanding and agrees to proceed. Continue Dexilant Modify diet, avoid trigger foods Will contact Dr. Hassell Done; pt may need additional fluid removal to see if this helps alleviate some symptoms. I have strongly advised pt to return for f/u.           Imaging: No results found.

## 2016-03-04 NOTE — Patient Instructions (Signed)
Your physician wants you to follow-up in: 6 Months with Dr. Branch. You will receive a reminder letter in the mail two months in advance. If you don't receive a letter, please call our office to schedule the follow-up appointment.  Your physician recommends that you continue on your current medications as directed. Please refer to the Current Medication list given to you today.  If you need a refill on your cardiac medications before your next appointment, please call your pharmacy.  Thank you for choosing [facility]!   

## 2016-03-04 NOTE — Progress Notes (Signed)
Cardiology Office Note   Date:  03/04/2016   ID:  Patrick Dezarn., DOB 12-18-1960, MRN [id number]  PCP:  Alonza Bogus, MD  Cardiologist: Cloria Spring, NP   Chief Complaint  Patient presents with  . Palpitations      History of Present Illness: Patrick Chapman. is a 55 y.o. male who presents for ongoing assessment and management of palpitations. He is normally followed by Dr. Harl Bowie in the [place] office.he was last seen by me on 02/22/2016 4 worsening palpitations but had stopped taking beta blocker. He was continued on metoprolol 50 mg twice a day, and started on magnesium 200 mg twice a day. He was advised to cut down on tobacco, increase exercise to reduce stress. Blood pressure was soft therefore Azor was decreased to 5/20 mg daily, as he was having some mild dizziness with position change. He was advised to take blood pressures at home and bring back recording sheet for our evaluation.  [name] back today feeling much better. His blood pressure is improved. He is tolerating the magnesium that it is giving him some heartburn. He injured his left knee while he was increasing his activity by walking and has been seen by her primary care who has placed him on steroids and rest. He wants to get back walking so he can Chapman some weight. His stress level of work as decreased.    Past Medical History:  Diagnosis Date  . GERD (gastroesophageal reflux disease)   . Hypertension   . Obesity   . Sleep apnea    was tested, but had lap band surgery and with weight loss, no problems    Past Surgical History:  Procedure Laterality Date  . ANTERIOR CERVICAL DECOMP/DISCECTOMY FUSION N/A 08/15/2014   Procedure: ANTERIOR CERVICAL DECOMPRESSION/DISCECTOMY FUSION 1 LEVEL;  Surgeon: Patrick Lose, MD;  Location: [place] [facility];  Service: Neurosurgery;  Laterality: N/A;  C56 anterior cervical fusion with interbody prosthesis plating and bonegraft C 5-6  . CHOLECYSTECTOMY    .  COLONOSCOPY WITH ESOPHAGOGASTRODUODENOSCOPY (EGD)  06/12/2012   Procedure: COLONOSCOPY WITH ESOPHAGOGASTRODUODENOSCOPY (EGD);  Surgeon: Patrick Dolin, MD;  [facility];  Service: Endoscopy;  Laterality: N/A;  3:00 PM  . LAPAROSCOPIC GASTRIC BANDING  2009   Dr. Hassell Done     Current Outpatient Prescriptions  Medication Sig Dispense Refill  . amLODipine-olmesartan (AZOR) 5-20 MG tablet Take 1 tablet by mouth daily. 90 tablet 3  . aspirin 81 MG tablet Take 1 tablet (81 mg total) by mouth daily. 30 tablet   . dexlansoprazole (DEXILANT) 60 MG capsule Take 60 mg by mouth 2 (two) times daily.     Marland Kitchen loratadine (CLARITIN) 10 MG tablet Take 10 mg by mouth daily as needed for allergies.    . Magnesium 200 MG TABS Take 200 mg by mouth 2 (two) times daily.    . metoprolol (LOPRESSOR) 50 MG tablet Take 1 tablet (50 mg total) by mouth 2 (two) times daily. 180 tablet 3  . Multiple Vitamins-Minerals (MULTIVITAMIN WITH MINERALS) tablet Take 1 tablet by mouth daily. Juice Plus     No current facility-administered medications for this visit.     Allergies:   Codeine    Social History:  The patient  reports that he quit smoking about 28 years ago. His smoking use included Cigarettes. He started smoking about 38 years ago. He has a 10.00 pack-year smoking history. His smokeless tobacco use includes Snuff. He reports that he drinks  alcohol. He reports that he does not use drugs.   Family History:  The patient's family history includes Diabetes in his father; Heart attack in his father and mother.    ROS: All other systems are reviewed and negative. Unless otherwise mentioned in H&P    PHYSICAL EXAM: VS:  BP 110/76   Pulse 77   Ht 5\' 11"  (1.803 m)   Wt (!) 321 lb (145.6 kg)   SpO2 96%   BMI 44.77 kg/m  , BMI Body mass index is 44.77 kg/m. GEN: Well nourished, well developed, in no acute distress. Morbidly obese HEENT: normal  Neck: no JVD, carotid bruits, or masses Cardiac: RRR; no  murmurs, rubs, or gallops,no edema  Respiratory:  clear to auscultation bilaterally, normal work of breathing GI: soft, nontender, nondistended, + BS MS: no deformity or atrophy  Skin: warm and dry, no rash Neuro:  Strength and sensation are intact Psych: euthymic mood, full affect  Recent Labs: 02/22/2016: BUN 16; Creat 1.28; Magnesium 2.1; Potassium 4.7; Sodium 142    Lipid Panel    Component Value Date/Time   CHOL 184 07/04/2007   TRIG 146 07/04/2007   HDL 35 07/04/2007   LDLCALC 120 07/04/2007      Wt Readings from Last 3 Encounters:  03/04/16 (!) 321 lb (145.6 kg)  02/22/16 (!) 316 lb (143.3 kg)  09/15/15 (!) 303 lb 6.4 oz (137.6 kg)      ASSESSMENT AND PLAN:  1.  Palpitations: Improved with use of magnesium. We'll decrease to 200 mg daily now. Stress level is down concerning his job and he states that walking really helps. He unfortunately is having to slow down temporarily do to a knee injury. He is being followed by primary care for this.continue metoprolol.  2. Hypertension: Continue current medication regimen with amlodipine/olmesartan 5/20. Blood pressure is improved with lower dose  3. GERD.on PPI. Continue his PCP with referral to GI if necessary.  Current medicines are reviewed at length with the patient today.    Labs/ tests ordered today include:  No orders of the defined types were placed in this encounter.    Disposition:   FU with 6 months  Signed, Jory Sims, NP  03/04/2016 3:46 PM    [facility] [address] [place], [place] [address] Phone: [phone]; Fax: [phone]

## 2016-03-23 ENCOUNTER — Encounter: Payer: Self-pay | Admitting: Orthopedic Surgery

## 2016-03-23 ENCOUNTER — Ambulatory Visit (INDEPENDENT_AMBULATORY_CARE_PROVIDER_SITE_OTHER): Payer: BC Managed Care – PPO | Admitting: Orthopedic Surgery

## 2016-03-23 ENCOUNTER — Ambulatory Visit (INDEPENDENT_AMBULATORY_CARE_PROVIDER_SITE_OTHER): Payer: BC Managed Care – PPO

## 2016-03-23 VITALS — BP 130/79 | HR 74 | Ht 72.0 in | Wt 320.0 lb

## 2016-03-23 DIAGNOSIS — M7052 Other bursitis of knee, left knee: Secondary | ICD-10-CM | POA: Diagnosis not present

## 2016-03-23 DIAGNOSIS — M25562 Pain in left knee: Secondary | ICD-10-CM | POA: Diagnosis not present

## 2016-03-23 NOTE — Patient Instructions (Addendum)

## 2016-03-23 NOTE — Progress Notes (Signed)
Chief Complaint  Patient presents with  . Knee Pain    Left knee pain, no injury.   HPI 55 year old male presents with a 6 week history of medial knee pain with 3 out of 10 stabbing radiating discomfort over the medial sartorius gracilis and semitendinosus with pain and stiffness. Prior treatment Aleve and ice. These treatments have not been effective. His review of systems is positive for night sweats palpitations back pain stiff joints gait problem muscle weakness limb pain joint pain seasonal allergies weakness and burning pain in the legs  Review of Systems  All other systems reviewed and are negative.   Past Medical History:  Diagnosis Date  . GERD (gastroesophageal reflux disease)   . Hypertension   . Obesity   . Sleep apnea    was tested, but had lap band surgery and with weight loss, no problems    Past Surgical History:  Procedure Laterality Date  . ANTERIOR CERVICAL DECOMP/DISCECTOMY FUSION N/A 08/15/2014   Procedure: ANTERIOR CERVICAL DECOMPRESSION/DISCECTOMY FUSION 1 LEVEL;  Surgeon: Consuella Lose, MD;  Location: [place] [facility];  Service: Neurosurgery;  Laterality: N/A;  C56 anterior cervical fusion with interbody prosthesis plating and bonegraft C 5-6  . CHOLECYSTECTOMY    . COLONOSCOPY WITH ESOPHAGOGASTRODUODENOSCOPY (EGD)  06/12/2012   Procedure: COLONOSCOPY WITH ESOPHAGOGASTRODUODENOSCOPY (EGD);  Surgeon: Daneil Dolin, MD;  [facility];  Service: Endoscopy;  Laterality: N/A;  3:00 PM  . LAPAROSCOPIC GASTRIC BANDING  2009   Dr. Hassell Done   Family History  Problem Relation Age of Onset  . Heart attack Mother     living  . Heart attack Father     living  . Diabetes Father   . Colon cancer Neg Hx    Social History  Substance Use Topics  . Smoking status: Former Smoker    Packs/day: 1.00    Years: 10.00    Types: Cigarettes    Start date: 05/23/1977    Quit date: 05/24/1987  . Smokeless tobacco: Current User    Types: Snuff  . Alcohol use 0.0  oz/week     Comment: occassional    Current Outpatient Prescriptions:  .  amLODipine-olmesartan (AZOR) 5-20 MG tablet, Take 1 tablet by mouth daily., Disp: 90 tablet, Rfl: 3 .  aspirin 81 MG tablet, Take 1 tablet (81 mg total) by mouth daily., Disp: 30 tablet, Rfl:  .  dexlansoprazole (DEXILANT) 60 MG capsule, Take 60 mg by mouth 2 (two) times daily. , Disp: , Rfl:  .  loratadine (CLARITIN) 10 MG tablet, Take 10 mg by mouth daily as needed for allergies., Disp: , Rfl:  .  Magnesium 200 MG TABS, Take 200 mg by mouth 2 (two) times daily., Disp: , Rfl:  .  metoprolol (LOPRESSOR) 50 MG tablet, Take 1 tablet (50 mg total) by mouth 2 (two) times daily., Disp: 180 tablet, Rfl: 3 .  Multiple Vitamins-Minerals (MULTIVITAMIN WITH MINERALS) tablet, Take 1 tablet by mouth daily. Juice Plus, Disp: , Rfl:   BP 130/79   Pulse 74   Ht 6' (1.829 m)   Wt (!) 320 lb (145.2 kg)   BMI 43.40 kg/m   Physical Exam  Constitutional: He is oriented to person, place, and time. He appears well-developed and well-nourished. No distress.  Cardiovascular: Normal rate and intact distal pulses.   Neurological: He is alert and oriented to person, place, and time.  Skin: Skin is warm and dry. No rash noted. He is not diaphoretic. No erythema. No pallor.  Psychiatric: He has a normal mood and affect. His behavior is normal. Judgment and thought content normal.    Ortho Exam Left knee the sartorius chrysalis area is tender and swollen his range of motion is normal his joint is nontender his knee is stable strength normal skin is intact pulses are good lymph nodes are negative is no peripheral edema sensation is normal  Right knee normal range of motion stability and strength skin is normal pulses are excellent no peripheral edema and sensation is normal  ASSESSMENT: My personal interpretation of the images:  X-rays show very good looking knee at this point minimal medial narrowing no spurs   Encounter Diagnoses   Name Primary?  . Left knee pain   . Pes anserinus bursitis of left knee Yes    PLAN Procedure note left knee injection for bursitis  verbal consent was obtained to inject left knee PES BURSA  Timeout was completed to confirm the site of injection  The medications used were 40 mg of Depo-Medrol and 1% lidocaine 3 cc  Anesthesia was provided by ethyl chloride and the skin was prepped with alcohol.  After cleaning the skin with alcohol a 25-gauge needle was used to inject the left knee bursa.  There were no complications and a sterile bandage was applied     Follow-up as needed Arther Abbott, MD 03/23/2016 5:21 PM

## 2016-03-29 ENCOUNTER — Encounter: Payer: Self-pay | Admitting: Orthopedic Surgery

## 2016-03-29 NOTE — Telephone Encounter (Signed)
03/29/16 11:09AM - Patient also called in regard to same message sent via [place] regarding left knee pain and "no improvement" from injection done on this knee 03/23/16.  Please advise - appointment or other recommendation.  Patient's ph# is [phone] or MyChart.

## 2016-10-17 ENCOUNTER — Other Ambulatory Visit (HOSPITAL_COMMUNITY): Payer: Self-pay | Admitting: Pulmonary Disease

## 2016-10-17 DIAGNOSIS — N6320 Unspecified lump in the left breast, unspecified quadrant: Secondary | ICD-10-CM

## 2016-10-17 DIAGNOSIS — R229 Localized swelling, mass and lump, unspecified: Principal | ICD-10-CM

## 2016-10-17 DIAGNOSIS — IMO0002 Reserved for concepts with insufficient information to code with codable children: Secondary | ICD-10-CM

## 2016-10-18 ENCOUNTER — Ambulatory Visit (HOSPITAL_COMMUNITY)
Admission: RE | Admit: 2016-10-18 | Discharge: 2016-10-18 | Disposition: A | Discharge status: Home / Self Care | Payer: BC Managed Care – PPO | Source: Ambulatory Visit | Attending: Pulmonary Disease | Admitting: Pulmonary Disease

## 2016-10-18 ENCOUNTER — Other Ambulatory Visit (HOSPITAL_COMMUNITY): Payer: Self-pay | Admitting: Pulmonary Disease

## 2016-10-18 DIAGNOSIS — D1779 Benign lipomatous neoplasm of other sites: Secondary | ICD-10-CM | POA: Diagnosis not present

## 2016-10-18 DIAGNOSIS — N6320 Unspecified lump in the left breast, unspecified quadrant: Secondary | ICD-10-CM

## 2016-10-24 ENCOUNTER — Other Ambulatory Visit (HOSPITAL_COMMUNITY): Payer: Self-pay | Admitting: Pulmonary Disease

## 2016-10-24 DIAGNOSIS — M545 Low back pain: Secondary | ICD-10-CM

## 2017-02-16 ENCOUNTER — Other Ambulatory Visit: Payer: Self-pay | Admitting: Cardiology

## 2017-02-16 ENCOUNTER — Other Ambulatory Visit: Payer: Self-pay | Admitting: Adult Health

## 2017-03-20 ENCOUNTER — Other Ambulatory Visit: Payer: Self-pay | Admitting: Cardiology

## 2017-03-20 ENCOUNTER — Other Ambulatory Visit: Payer: Self-pay | Admitting: Adult Health

## 2017-04-18 ENCOUNTER — Other Ambulatory Visit: Payer: Self-pay | Admitting: Cardiology

## 2017-04-18 MED ORDER — METOPROLOL TARTRATE 50 MG PO TABS: 50.0000 mg | ORAL_TABLET | Freq: Two times a day (BID) | ORAL | 0 refills | Status: DC

## 2017-04-18 NOTE — Telephone Encounter (Signed)
Patient called stating that he needs refill on metoprolol tartrate (LOPRESSOR) 50 MG tablet  Appointment will be made to see Dr. Harl Bowie.  [place]. [phone].

## 2017-04-18 NOTE — Telephone Encounter (Signed)
Patient last seen 02/22/2016.  Patient out of medication today.  OV scheduled for Estella Husk, PA for Wednesday, 04/26/2017 in our [place] office.  Lopressor sent to pharmacy for 1 month supply.  Patient verbalized understanding.

## 2017-04-25 DIAGNOSIS — R002 Palpitations: Secondary | ICD-10-CM | POA: Insufficient documentation

## 2017-04-25 NOTE — Progress Notes (Signed)
Cardiology Office Note    Date:  04/26/2017   ID:  Patrick Browns., DOB 04/19/1961, MRN [id number]  PCP:  Sinda Du, MD  Cardiologist: Dr. Harl Bowie   Chief Complaint  Patient presents with  . Follow-up    History of Present Illness:  Patrick Schnabel. is a 56 y.o. male with history of palpitations, hypertension, and obesity. Last seen 02/2016 by Jory Sims, NP and he had stopped taking his beta blocker and had worsening palpitations. This was resumed and he was started on magnesium and advised to reduce tobacco abuse and increased exercise. Blood pressure was soft so Azor was decreased. Holter monitor 06/2015 primarily normal sinus rhythm with occasional isolated PVC. 2-D echo 2011 EF55-60%. Atrium mildly dilated.  Patient comes in today for yearly f/u. He still has occassional palpitations that he describes as skipping off/on for a couple of days. No racing, dizziness, chest pain or dyspnea. Just feels washed out. Hasn't happened in about 6 months. Can't relate it to anything. No caffeine or alcohol.   Past Medical History:  Diagnosis Date  . GERD (gastroesophageal reflux disease)   . Hypertension   . Obesity   . Sleep apnea    was tested, but had lap band surgery and with weight loss, no problems    Past Surgical History:  Procedure Laterality Date  . ANTERIOR CERVICAL DECOMP/DISCECTOMY FUSION N/A 08/15/2014   Procedure: ANTERIOR CERVICAL DECOMPRESSION/DISCECTOMY FUSION 1 LEVEL;  Surgeon: Consuella Lose, MD;  Location: [facility];  Service: Neurosurgery;  Laterality: N/A;  C56 anterior cervical fusion with interbody prosthesis plating and bonegraft C 5-6  . CHOLECYSTECTOMY    . COLONOSCOPY WITH ESOPHAGOGASTRODUODENOSCOPY (EGD)  06/12/2012   Procedure: COLONOSCOPY WITH ESOPHAGOGASTRODUODENOSCOPY (EGD);  Surgeon: Daneil Dolin, MD;  Location: [facility];  Service: Endoscopy;  Laterality: N/A;  3:00 PM  . LAPAROSCOPIC GASTRIC BANDING  2009   Dr. Hassell Done     Current Medications: Current Meds  Medication Sig  . aspirin 81 MG tablet Take 1 tablet (81 mg total) by mouth daily.  . AZOR 5-20 MG tablet TAKE ONE (1) TABLET BY MOUTH EVERY DAY  . dexlansoprazole (DEXILANT) 60 MG capsule Take 60 mg by mouth 2 (two) times daily.   Marland Kitchen loratadine (CLARITIN) 10 MG tablet Take 10 mg by mouth daily.  . metoprolol tartrate (LOPRESSOR) 50 MG tablet Take 1 tablet (50 mg total) by mouth 2 (two) times daily.  . Multiple Vitamins-Minerals (MULTIVITAMIN WITH MINERALS) tablet Take 1 tablet by mouth daily. Juice Plus  . [DISCONTINUED] loratadine (CLARITIN) 10 MG tablet Take 10 mg by mouth daily as needed for allergies.  . [DISCONTINUED] Magnesium 200 MG TABS Take 200 mg by mouth 2 (two) times daily.     Allergies:   Codeine   Social History   Social History  . Marital status: Married    Spouse name: N/A  . Number of children: N/A  . Years of education: N/A   Social History Main Topics  . Smoking status: Former Smoker    Packs/day: 1.00    Years: 10.00    Types: Cigarettes    Start date: 05/23/1977    Quit date: 05/24/1987  . Smokeless tobacco: Current User    Types: Snuff  . Alcohol use 0.0 oz/week     Comment: occassional  . Drug use: No  . Sexual activity: Yes    Partners: Female    Birth control/ protection: Surgical     Comment: spouse  Other Topics Concern  . None   Social History Narrative  . None     Family History:  The patient's family history includes Diabetes in his father; Heart attack in his father and mother.   ROS:   Please see the history of present illness.    Review of Systems  Constitution: Positive for weight gain.  HENT: Negative.   Cardiovascular: Positive for palpitations.  Respiratory: Negative.   Endocrine: Negative.   Hematologic/Lymphatic: Negative.   Musculoskeletal: Negative.   Gastrointestinal: Negative.   Genitourinary: Negative.   Neurological: Negative.    All other systems reviewed and are  negative.   PHYSICAL EXAM:   VS:  BP 114/76   Pulse 72   Ht 6' (1.829 m)   Wt (!) 334 lb (151.5 kg)   SpO2 97%   BMI 45.30 kg/m   Physical Exam  GEN: Obese, in no acute distress  Neck: no JVD, carotid bruits, or masses Cardiac:RRR; distant heart sounds no murmurs, rubs, or gallops  Respiratory:  clear to auscultation bilaterally, normal work of breathing GI: soft, nontender, nondistended, + BS Ext: Mild left ankle edema without cyanosis, clubbing,  Good distal pulses bilaterally Neuro:  Alert and Oriented x 3 Psych: euthymic mood, full affect  Wt Readings from Last 3 Encounters:  04/26/17 (!) 334 lb (151.5 kg)  03/23/16 (!) 320 lb (145.2 kg)  03/04/16 (!) 321 lb (145.6 kg)      Studies/Labs Reviewed:   EKG:  EKG is  ordered today.  The ekg ordered today demonstrates Normal sinus rhythm no acute change  Recent Labs: No results found for requested labs within last 8760 hours.   Lipid Panel    Component Value Date/Time   CHOL 184 07/04/2007   TRIG 146 07/04/2007   HDL 35 07/04/2007   LDLCALC 120 07/04/2007    Additional studies/ records that were reviewed today include:  10/2009 echo Study Conclusions   - Left ventricle: The cavity size was normal. Wall thickness was    increased in a pattern of mild LVH. Systolic function was normal.    The estimated ejection fraction was in the range of 55% to 60%.    Wall motion was normal; there were no regional wall motion    abnormalities.  - Left atrium: The atrium was mildly dilated.     06/2015 Holter monitor  Predominant rhythm is normal sinus  There is rate ventricular ectopy, no supraventricular ectopy  Max HR 127, min HR 50, Avg HF 78  Symptoms of palpitations correspond with normal sinus rhythm with just occasional isolated PVCs.  No significant arrhythmias       ASSESSMENT:    1. Essential hypertension   2. Palpitations   3. Hyperlipidemia, unspecified hyperlipidemia type      PLAN:  In order  of problems listed above:  Essential hypertension well controlled on Azor and metoprolol  Palpitations still bothersome on occasion. Mostly controlled on metoprolol. I told him he could take Patrick extra half to whole metoprolol as needed for breakthrough palpitations.  Hyperlipidemia diet controlled and managed by Dr. Luan Pulling. Patient quit walking about 6 months ago.  Morbid obesity with 14 pound weight gain over the past year. Weight loss program such as Weight Watchers recommended to the patient. Essential for his overall health.   Medication Adjustments/Labs and Tests Ordered: Current medicines are reviewed at length with the patient today.  Concerns regarding medicines are outlined above.  Medication changes, Labs and Tests ordered today are listed  in the Patient Instructions below. There are no Patient Instructions on file for this visit.   Signed, Ermalinda Barrios, PA-C  04/26/2017 1:20 PM    [facility] [address], [place], [place]  [address] Phone: [phone]; Fax: [phone]

## 2017-04-26 ENCOUNTER — Ambulatory Visit (INDEPENDENT_AMBULATORY_CARE_PROVIDER_SITE_OTHER): Payer: BC Managed Care – PPO | Admitting: Physician Assistant

## 2017-04-26 ENCOUNTER — Encounter: Payer: Self-pay | Admitting: Physician Assistant

## 2017-04-26 VITALS — BP 114/76 | HR 72 | Ht 72.0 in | Wt 334.0 lb

## 2017-04-26 DIAGNOSIS — I10 Essential (primary) hypertension: Secondary | ICD-10-CM | POA: Diagnosis not present

## 2017-04-26 DIAGNOSIS — R002 Palpitations: Secondary | ICD-10-CM

## 2017-04-26 DIAGNOSIS — E785 Hyperlipidemia, unspecified: Secondary | ICD-10-CM | POA: Diagnosis not present

## 2017-04-26 NOTE — Patient Instructions (Signed)
Medication Instructions:  Your physician recommends that you continue on your current medications as directed. Please refer to the Current Medication list given to you today.   Labwork: NONE   Testing/Procedures: NONE   Follow-Up: Your physician wants you to follow-up in: 1 Year with Dr. Harl Bowie. You will receive a reminder letter in the mail two months in advance. If you don't receive a letter, please call our office to schedule the follow-up appointment.   Any Other Special Instructions Will Be Listed Below (If Applicable). Your physician recommend that you start a weight loss program. ( Weight Watchers)      If you need a refill on your cardiac medications before your next appointment, please call your pharmacy. Thank you for choosing [facility]!

## 2017-05-11 ENCOUNTER — Encounter: Payer: Self-pay | Admitting: Gastroenterology

## 2017-12-22 ENCOUNTER — Other Ambulatory Visit: Payer: Self-pay

## 2017-12-22 ENCOUNTER — Emergency Department (HOSPITAL_COMMUNITY)
Admission: EM | Admit: 2017-12-22 | Discharge: 2017-12-22 | Disposition: A | Discharge status: Home / Self Care | Payer: BC Managed Care – PPO | Attending: Emergency Medicine | Admitting: Emergency Medicine

## 2017-12-22 ENCOUNTER — Emergency Department (HOSPITAL_COMMUNITY): Payer: BC Managed Care – PPO

## 2017-12-22 ENCOUNTER — Encounter (HOSPITAL_COMMUNITY): Payer: Self-pay | Admitting: Emergency Medicine

## 2017-12-22 DIAGNOSIS — R109 Unspecified abdominal pain: Secondary | ICD-10-CM | POA: Diagnosis present

## 2017-12-22 DIAGNOSIS — F17220 Nicotine dependence, chewing tobacco, uncomplicated: Secondary | ICD-10-CM | POA: Insufficient documentation

## 2017-12-22 DIAGNOSIS — Z79899 Other long term (current) drug therapy: Secondary | ICD-10-CM | POA: Diagnosis not present

## 2017-12-22 DIAGNOSIS — N201 Calculus of ureter: Secondary | ICD-10-CM | POA: Insufficient documentation

## 2017-12-22 DIAGNOSIS — I10 Essential (primary) hypertension: Secondary | ICD-10-CM | POA: Insufficient documentation

## 2017-12-22 DIAGNOSIS — Z7982 Long term (current) use of aspirin: Secondary | ICD-10-CM | POA: Insufficient documentation

## 2017-12-22 DIAGNOSIS — R7989 Other specified abnormal findings of blood chemistry: Secondary | ICD-10-CM | POA: Insufficient documentation

## 2017-12-22 LAB — URINALYSIS, ROUTINE W REFLEX MICROSCOPIC
Bilirubin Urine: NEGATIVE
Glucose, UA: NEGATIVE mg/dL
Hgb urine dipstick: NEGATIVE
Ketones, ur: NEGATIVE mg/dL
Leukocytes, UA: NEGATIVE
Nitrite: NEGATIVE
Protein, ur: NEGATIVE mg/dL
Specific Gravity, Urine: 1.016 (ref 1.005–1.030)
pH: 5 (ref 5.0–8.0)

## 2017-12-22 LAB — COMPREHENSIVE METABOLIC PANEL
ALT: 22 U/L (ref 17–63)
AST: 17 U/L (ref 15–41)
Albumin: 4 g/dL (ref 3.5–5.0)
Alkaline Phosphatase: 46 U/L (ref 38–126)
Anion gap: 6 (ref 5–15)
BUN: 16 mg/dL (ref 6–20)
CO2: 27 mmol/L (ref 22–32)
Calcium: 8.8 mg/dL — ABNORMAL LOW (ref 8.9–10.3)
Chloride: 105 mmol/L (ref 101–111)
Creatinine, Ser: 1.71 mg/dL — ABNORMAL HIGH (ref 0.61–1.24)
GFR calc Af Amer: 50 mL/min — ABNORMAL LOW (ref >60)
GFR calc non Af Amer: 43 mL/min — ABNORMAL LOW (ref >60)
Glucose, Bld: 121 mg/dL — ABNORMAL HIGH (ref 65–99)
Potassium: 4.2 mmol/L (ref 3.5–5.1)
Sodium: 138 mmol/L (ref 135–145)
Total Bilirubin: 1 mg/dL (ref 0.3–1.2)
Total Protein: 7.1 g/dL (ref 6.5–8.1)

## 2017-12-22 LAB — CREATININE, SERUM
Creatinine, Ser: 1.79 mg/dL — ABNORMAL HIGH (ref 0.61–1.24)
GFR calc Af Amer: 47 mL/min — ABNORMAL LOW (ref >60)
GFR calc non Af Amer: 41 mL/min — ABNORMAL LOW (ref >60)

## 2017-12-22 LAB — CBC WITH DIFFERENTIAL/PLATELET
Basophils Absolute: 0 10*3/uL (ref 0.0–0.1)
Basophils Relative: 0 %
Eosinophils Absolute: 0 10*3/uL (ref 0.0–0.7)
Eosinophils Relative: 0 %
HCT: 45.2 % (ref 39.0–52.0)
Hemoglobin: 14.3 g/dL (ref 13.0–17.0)
Lymphocytes Relative: 19 %
Lymphs Abs: 1.8 10*3/uL (ref 0.7–4.0)
MCH: 29.7 pg (ref 26.0–34.0)
MCHC: 31.6 g/dL (ref 30.0–36.0)
MCV: 94 fL (ref 78.0–100.0)
Monocytes Absolute: 1.1 10*3/uL — ABNORMAL HIGH (ref 0.1–1.0)
Monocytes Relative: 12 %
Neutro Abs: 6.3 10*3/uL (ref 1.7–7.7)
Neutrophils Relative %: 69 %
Platelets: 188 10*3/uL (ref 150–400)
RBC: 4.81 MIL/uL (ref 4.22–5.81)
RDW: 14.3 % (ref 11.5–15.5)
WBC: 9.2 10*3/uL (ref 4.0–10.5)

## 2017-12-22 MED ORDER — HYDROMORPHONE HCL 1 MG/ML IJ SOLN
1.0000 mg | Freq: Once | INTRAMUSCULAR | Status: AC
  Administered 2017-12-22: 1 mg via INTRAVENOUS
  Filled 2017-12-22: qty 1

## 2017-12-22 MED ORDER — OXYCODONE-ACETAMINOPHEN 5-325 MG PO TABS: 1.0000 | ORAL_TABLET | ORAL | 0 refills | Status: DC | PRN

## 2017-12-22 MED ORDER — TAMSULOSIN HCL 0.4 MG PO CAPS: 0.4000 mg | ORAL_CAPSULE | Freq: Every day | ORAL | 0 refills | Status: DC

## 2017-12-22 MED ORDER — ONDANSETRON HCL 4 MG/2ML IJ SOLN
4.0000 mg | Freq: Once | INTRAMUSCULAR | Status: AC
  Administered 2017-12-22: 4 mg via INTRAVENOUS
  Filled 2017-12-22: qty 2

## 2017-12-22 MED ORDER — SODIUM CHLORIDE 0.9 % IV BOLUS
500.0000 mL | Freq: Once | INTRAVENOUS | Status: AC
  Administered 2017-12-22: 500 mL via INTRAVENOUS

## 2017-12-22 MED ORDER — ONDANSETRON HCL 8 MG PO TABS: 4.0000 mg | ORAL_TABLET | Freq: Three times a day (TID) | ORAL | 0 refills | Status: DC | PRN

## 2017-12-22 NOTE — ED Notes (Signed)
Pt taken to ct 

## 2017-12-22 NOTE — ED Triage Notes (Signed)
Lt flank pain x 2 days.  Denies any pain with urination

## 2017-12-22 NOTE — ED Notes (Signed)
02 2l [place] applied due to sats 90% ra

## 2017-12-22 NOTE — ED Notes (Signed)
Pt off 02. Up to bathroom without difficulty

## 2017-12-22 NOTE — ED Notes (Signed)
Pt states pain going back up and requested other 0.5mg  of dilaudid which was given at this time. PA aware. Nad.

## 2017-12-22 NOTE — Discharge Instructions (Addendum)
Continue drinking plenty of water.  You will need follow-up with your PCP to have your creatinine level rechecked in a few days.  Strain all urine.  Call the urology office next week to arrange a follow-up appointment if you have not passed the stone or your pain is not improving.

## 2017-12-22 NOTE — ED Provider Notes (Signed)
[facility] EMERGENCY DEPARTMENT Provider Note   CSN: [id number] Arrival date & time: 12/22/17  1051     History   Chief Complaint Chief Complaint  Patient presents with  . Flank Pain    HPI Patrick Chapman is a 57 y.o. male.  HPI   Patrick Chapman is a 57 y.o. male who presents to the Emergency Department complaining of gradual onset of left flank pain.  Symptoms have been present for 2 days.  He describes a radiating pain from his left flank that radiates to his groin.  Pain has been waxing and waning in severity.  He also reports associated nausea.  Pain is not affected by movement or food intake.  He denies urinary symptoms although he states he has noticed that his urine is dark in color, admits to not drinking as much water as usual.  He denies chest pain, shortness of breath, fever, chills, and diarrhea.  He has tried over-the-counter medications without relief.  No history of previous kidney stones   Past Medical History:  Diagnosis Date  . GERD (gastroesophageal reflux disease)   . Hypertension   . Obesity   . Sleep apnea    was tested, but had lap band surgery and with weight loss, no problems    Patient Active Problem List   Diagnosis Date Noted  . Morbid obesity ([place]) 04/26/2017  . Palpitations 04/25/2017  . Cervical spondylosis with radiculopathy 08/15/2014  . LUQ abdominal pain 01/14/2011  . GERD (gastroesophageal reflux disease) 11/25/2010  . Epigastric pain 11/25/2010  . Hyperlipidemia 10/16/2009  . CHEST PAIN UNSPECIFIED 10/16/2009  . OVERWEIGHT 10/13/2009  . Essential hypertension 10/13/2009    Past Surgical History:  Procedure Laterality Date  . ANTERIOR CERVICAL DECOMP/DISCECTOMY FUSION N/A 08/15/2014   Procedure: ANTERIOR CERVICAL DECOMPRESSION/DISCECTOMY FUSION 1 LEVEL;  Surgeon: Consuella Lose, MD;  Location: [facility];  Service: Neurosurgery;  Laterality: N/A;  C56 anterior cervical fusion with interbody prosthesis plating and bonegraft C  5-6  . CHOLECYSTECTOMY    . COLONOSCOPY WITH ESOPHAGOGASTRODUODENOSCOPY (EGD)  06/12/2012   Procedure: COLONOSCOPY WITH ESOPHAGOGASTRODUODENOSCOPY (EGD);  Surgeon: Daneil Dolin, MD;  Location: [facility];  Service: Endoscopy;  Laterality: N/A;  3:00 PM  . LAPAROSCOPIC GASTRIC BANDING  2009   Dr. Hassell Done        Home Medications    Prior to Admission medications   Medication Sig Start Date End Date Taking? Authorizing Provider  aspirin 81 MG tablet Take 1 tablet (81 mg total) by mouth daily. 08/29/14  Yes Consuella Lose, MD  AZOR 5-20 MG tablet TAKE ONE (1) TABLET BY MOUTH EVERY DAY 03/20/17  Yes Branch, Alphonse Guild, MD  dexlansoprazole (DEXILANT) 60 MG capsule Take 60 mg by mouth 2 (two) times daily.  06/15/11  Yes Mahala Menghini, PA-C  loratadine (CLARITIN) 10 MG tablet Take 10 mg by mouth daily.   Yes [provider]  metoprolol tartrate (LOPRESSOR) 50 MG tablet Take 1 tablet (50 mg total) by mouth 2 (two) times daily. 04/18/17  Yes BranchAlphonse Guild, MD  Multiple Vitamins-Minerals (MULTIVITAMIN WITH MINERALS) tablet Take 1 tablet by mouth daily. Juice Plus   Yes [provider]    Family History Family History  Problem Relation Age of Onset  . Heart attack Mother        living  . Heart attack Father        living  . Diabetes Father   . Colon cancer Neg Hx  Social History Social History   Tobacco Use  . Smoking status: Former Smoker    Packs/day: 1.00    Years: 10.00    Pack years: 10.00    Types: Cigarettes    Start date: 05/23/1977    Last attempt to quit: 05/24/1987    Years since quitting: 30.6  . Smokeless tobacco: Current User    Types: Snuff  Substance Use Topics  . Alcohol use: Yes    Alcohol/week: 0.0 oz    Comment: occassional  . Drug use: No     Allergies   Codeine   Review of Systems Review of Systems  Constitutional: Negative for activity change, appetite change, chills and fever.  Respiratory: Negative for chest  tightness and shortness of breath.   Gastrointestinal: Positive for nausea. Negative for abdominal pain and vomiting.  Genitourinary: Positive for flank pain. Negative for decreased urine volume, difficulty urinating, dysuria, frequency, hematuria, penile swelling, scrotal swelling and urgency.  Musculoskeletal: Positive for back pain.  Skin: Negative for rash.  Neurological: Negative for dizziness, weakness and numbness.  Hematological: Negative for adenopathy.  Psychiatric/Behavioral: Negative for confusion.  All other systems reviewed and are negative.    Physical Exam Updated Vital Signs BP 111/79 (BP Location: Right Arm)   Pulse 80   Temp 97.6 F (36.4 C) (Oral)   Resp 16   Ht 6' (1.829 m)   Wt (!) 147.4 kg (325 lb)   SpO2 97%   BMI 44.08 kg/m   Physical Exam  Constitutional: He is oriented to person, place, and time. He appears well-developed and well-nourished. No distress.  Patient is obese.  Uncomfortable appearing  HENT:  Mouth/Throat: Oropharynx is clear and moist.  Cardiovascular: Normal rate and regular rhythm.  Pulmonary/Chest: Effort normal and breath sounds normal. No respiratory distress.  Abdominal: Soft. He exhibits no distension and no mass. There is no tenderness. There is no guarding.  No CVA tenderness  Musculoskeletal: Normal range of motion.  Neurological: He is alert and oriented to person, place, and time. No sensory deficit.  Skin: Skin is warm. Capillary refill takes less than 2 seconds.  Psychiatric: He has a normal mood and affect.  Nursing note and vitals reviewed.    ED Treatments / Results  Labs (all labs ordered are listed, but only abnormal results are displayed) Labs Reviewed  CBC WITH DIFFERENTIAL/PLATELET - Abnormal; Notable for the following components:      Result Value   Monocytes Absolute 1.1 (*)    All other components within normal limits  COMPREHENSIVE METABOLIC PANEL - Abnormal; Notable for the following components:    Glucose, Bld 121 (*)    Creatinine, Ser 1.71 (*)    Calcium 8.8 (*)    GFR calc non Af Amer 43 (*)    GFR calc Af Amer 50 (*)    All other components within normal limits  CREATININE, SERUM - Abnormal; Notable for the following components:   Creatinine, Ser 1.79 (*)    GFR calc non Af Amer 41 (*)    GFR calc Af Amer 47 (*)    All other components within normal limits  URINALYSIS, ROUTINE W REFLEX MICROSCOPIC    EKG None  Radiology Ct Renal Stone Study  Result Date: 12/22/2017 CLINICAL DATA:  LEFT flank pain for 2 days, history GERD, hypertension, prior gastric banding EXAM: CT ABDOMEN AND PELVIS WITHOUT CONTRAST TECHNIQUE: Multidetector CT imaging of the abdomen and pelvis was performed following the standard protocol without IV contrast. Sagittal and  coronal MPR images reconstructed from axial data set. No oral contrast administered. COMPARISON:  12/21/2006 FINDINGS: Lower chest: Minimal dependent atelectasis in RIGHT lower lobe Hepatobiliary: Gallbladder surgically absent.  Liver unremarkable. Pancreas: Normal appearance Spleen: Normal appearance Adrenals/Urinary Tract: Adrenal glands normal appearance. RIGHT kidney and ureter normal appearance. Mild LEFT periureteral and LEFT perinephric edema. Mild LEFT hydronephrosis and minimal ureteral dilatation due to a 1-2 mm distal LEFT ureteral calculus image 89. No LEFT renal mass. Bladder unremarkable. Stomach/Bowel: Normal appendix. Large and small bowel loops unremarkable. Prior laparoscopic gastric banding with band in expected 1:00 to 8:00 orientation on coronal images. Stomach otherwise unremarkable. Vascular/Lymphatic: Atherosclerotic calcification aorta without aneurysm. No adenopathy. Reproductive: Unremarkable Other: No free air or free fluid. Musculoskeletal: Unremarkable IMPRESSION: Mild LEFT hydronephrosis and hydroureter secondary to a 1-2 mm distal LEFT ureteral calculus. Prior laparoscopic gastric banding. Aortic Atherosclerosis  (ICD10-I70.0). Electronically Signed   By: Lavonia Dana M.D.   On: 12/22/2017 12:26    Procedures Procedures (including critical care time)  Medications Ordered in ED Medications  HYDROmorphone (DILAUDID) injection 1 mg (1 mg Intravenous Given 12/22/17 1152)  ondansetron (ZOFRAN) injection 4 mg (4 mg Intravenous Given 12/22/17 1152)  sodium chloride 0.9 % bolus 500 mL (0 mLs Intravenous Stopped 12/22/17 1340)  HYDROmorphone (DILAUDID) injection 1 mg (1 mg Intravenous Given 12/22/17 1325)     Initial Impression / Assessment and Plan / ED Course  I have reviewed the triage vital signs and the nursing notes.  Pertinent labs & imaging results that were available during my care of the patient were reviewed by me and considered in my medical decision making (see chart for details).     CT stone study reviewed, Stone is small and will likely pass  Pt has elevated creatinine, possibly related to dehydration.  Will give small bolus of IVF's and recheck creat.     [facility], Dr. Louis Meckel, regarding pt's elevated creatinine.  Advised to have pt f/u with PCP to have creat rechecked, rx with zofran, pain medicine, and flomax.  Urology f/u next week if stone hasn't passed or pain is not improved.  Urine strainer dispensed.  Patient reports feeling better, states his is ready for d/c home.  Agrees to tx plan and return precautions discussed.    Final Clinical Impressions(s) / ED Diagnoses   Final diagnoses:  Ureterolithiasis  Elevated serum creatinine    ED Discharge Orders    None       Kem Parkinson, PA-C 12/24/17 McKenna, [place], DO 12/24/17 2133

## 2018-01-23 ENCOUNTER — Other Ambulatory Visit: Payer: Self-pay | Admitting: Cardiology

## 2018-01-23 MED ORDER — METOPROLOL TARTRATE 50 MG PO TABS: 50.0000 mg | ORAL_TABLET | Freq: Two times a day (BID) | ORAL | 3 refills | Status: DC

## 2018-01-23 NOTE — Telephone Encounter (Signed)
Pt is needing refills for his meds sent to his pharmacy, not due to see JB till 04/2018

## 2018-01-23 NOTE — Telephone Encounter (Signed)
Refill for lopressor sent to pharmacy.

## 2018-04-26 ENCOUNTER — Encounter: Payer: Self-pay | Admitting: Cardiology

## 2018-04-26 ENCOUNTER — Ambulatory Visit: Payer: BC Managed Care – PPO | Admitting: Cardiology

## 2018-04-26 ENCOUNTER — Other Ambulatory Visit (HOSPITAL_COMMUNITY)
Admission: RE | Admit: 2018-04-26 | Discharge: 2018-04-26 | Disposition: A | Discharge status: Home / Self Care | Payer: BC Managed Care – PPO | Source: Ambulatory Visit | Attending: Cardiology | Admitting: Cardiology

## 2018-04-26 VITALS — BP 126/80 | HR 74 | Ht 72.0 in | Wt 350.4 lb

## 2018-04-26 DIAGNOSIS — E785 Hyperlipidemia, unspecified: Secondary | ICD-10-CM | POA: Insufficient documentation

## 2018-04-26 DIAGNOSIS — I10 Essential (primary) hypertension: Secondary | ICD-10-CM | POA: Diagnosis not present

## 2018-04-26 DIAGNOSIS — R002 Palpitations: Secondary | ICD-10-CM | POA: Insufficient documentation

## 2018-04-26 LAB — HEMOGLOBIN A1C
Hgb A1c MFr Bld: 6 % — ABNORMAL HIGH (ref 4.8–5.6)
Mean Plasma Glucose: 125.5 mg/dL

## 2018-04-26 LAB — CBC WITH DIFFERENTIAL/PLATELET
Basophils Absolute: 0.1 10*3/uL (ref 0.0–0.1)
Basophils Relative: 1 %
Eosinophils Absolute: 0.1 10*3/uL (ref 0.0–0.7)
Eosinophils Relative: 1 %
HCT: 46.6 % (ref 39.0–52.0)
Hemoglobin: 15.1 g/dL (ref 13.0–17.0)
Lymphocytes Relative: 28 %
Lymphs Abs: 2.2 10*3/uL (ref 0.7–4.0)
MCH: 30.9 pg (ref 26.0–34.0)
MCHC: 32.4 g/dL (ref 30.0–36.0)
MCV: 95.5 fL (ref 78.0–100.0)
Monocytes Absolute: 0.7 10*3/uL (ref 0.1–1.0)
Monocytes Relative: 9 %
Neutro Abs: 4.9 10*3/uL (ref 1.7–7.7)
Neutrophils Relative %: 61 %
Platelets: 197 10*3/uL (ref 150–400)
RBC: 4.88 MIL/uL (ref 4.22–5.81)
RDW: 14.4 % (ref 11.5–15.5)
WBC: 7.9 10*3/uL (ref 4.0–10.5)

## 2018-04-26 LAB — COMPREHENSIVE METABOLIC PANEL
ALT: 22 U/L (ref 0–44)
AST: 19 U/L (ref 15–41)
Albumin: 4.2 g/dL (ref 3.5–5.0)
Alkaline Phosphatase: 58 U/L (ref 38–126)
Anion gap: 9 (ref 5–15)
BUN: 19 mg/dL (ref 6–20)
CO2: 28 mmol/L (ref 22–32)
Calcium: 9.3 mg/dL (ref 8.9–10.3)
Chloride: 105 mmol/L (ref 98–111)
Creatinine, Ser: 1.38 mg/dL — ABNORMAL HIGH (ref 0.61–1.24)
GFR calc Af Amer: >60 mL/min (ref >60)
GFR calc non Af Amer: 56 mL/min — ABNORMAL LOW (ref >60)
Glucose, Bld: 116 mg/dL — ABNORMAL HIGH (ref 70–99)
Potassium: 4.2 mmol/L (ref 3.5–5.1)
Sodium: 142 mmol/L (ref 135–145)
Total Bilirubin: 0.7 mg/dL (ref 0.3–1.2)
Total Protein: 7.5 g/dL (ref 6.5–8.1)

## 2018-04-26 LAB — LIPID PANEL
Cholesterol: 207 mg/dL — ABNORMAL HIGH (ref 0–200)
HDL: 32 mg/dL — ABNORMAL LOW (ref >40)
LDL Cholesterol: 139 mg/dL — ABNORMAL HIGH (ref 0–99)
Total CHOL/HDL Ratio: 6.5 RATIO
Triglycerides: 180 mg/dL — ABNORMAL HIGH (ref <150)
VLDL: 36 mg/dL (ref 0–40)

## 2018-04-26 LAB — MAGNESIUM: Magnesium: 2.2 mg/dL (ref 1.7–2.4)

## 2018-04-26 LAB — TSH: TSH: 4.008 u[IU]/mL (ref 0.350–4.500)

## 2018-04-26 NOTE — Patient Instructions (Signed)
Medication Instructions:  STOP ASPIRIN   Labwork: ASAP  Testing/Procedures: NONE  Follow-Up: Your physician wants you to follow-up in: 1 YEAR.  You will receive a reminder letter in the mail two months in advance. If you don't receive a letter, please call our office to schedule the follow-up appointment.   Any Other Special Instructions Will Be Listed Below (If Applicable).     If you need a refill on your cardiac medications before your next appointment, please call your pharmacy.

## 2018-04-26 NOTE — Progress Notes (Signed)
Clinical Summary Mr. Shives is a 57 y.o.male seen today for follow up of the following medical problems.   1. Palpitations - 06/2015 holter monitor with primarily NSR, occasional isolated PVCs.  - since last visit he has cut back on alcohol, since that time palpitations significantly improved  - still with palpitations, stable in frequency and overall tolerable.   2. HTN - home bp's 120s/70s   3. Kidney stone - seen in ER 12/2017, had AKI at that time. Has not had repeat labs since   Past Medical History:  Diagnosis Date  . GERD (gastroesophageal reflux disease)   . Hypertension   . Obesity   . Sleep apnea    was tested, but had lap band surgery and with weight loss, no problems     Allergies  Allergen Reactions  . Codeine Nausea And Vomiting and Rash     Current Outpatient Medications  Medication Sig Dispense Refill  . aspirin 81 MG tablet Take 1 tablet (81 mg total) by mouth daily. 30 tablet   . AZOR 5-20 MG tablet TAKE ONE (1) TABLET BY MOUTH EVERY DAY 15 tablet 0  . dexlansoprazole (DEXILANT) 60 MG capsule Take 60 mg by mouth 2 (two) times daily.     Marland Kitchen loratadine (CLARITIN) 10 MG tablet Take 10 mg by mouth daily.    . metoprolol tartrate (LOPRESSOR) 50 MG tablet Take 1 tablet (50 mg total) by mouth 2 (two) times daily. 60 tablet 3  . Multiple Vitamins-Minerals (MULTIVITAMIN WITH MINERALS) tablet Take 1 tablet by mouth daily. Juice Plus    . ondansetron (ZOFRAN) 8 MG tablet Take 0.5 tablets (4 mg total) by mouth every 8 (eight) hours as needed for nausea or vomiting. 10 tablet 0  . oxyCODONE-acetaminophen (PERCOCET/ROXICET) 5-325 MG tablet Take 1 tablet by mouth every 4 (four) hours as needed. 15 tablet 0  . tamsulosin (FLOMAX) 0.4 MG CAPS capsule Take 1 capsule (0.4 mg total) by mouth daily. 20 capsule 0   No current facility-administered medications for this visit.      Past Surgical History:  Procedure Laterality Date  . ANTERIOR CERVICAL  DECOMP/DISCECTOMY FUSION N/A 08/15/2014   Procedure: ANTERIOR CERVICAL DECOMPRESSION/DISCECTOMY FUSION 1 LEVEL;  Surgeon: Consuella Lose, MD;  Location: [facility];  Service: Neurosurgery;  Laterality: N/A;  C56 anterior cervical fusion with interbody prosthesis plating and bonegraft C 5-6  . CHOLECYSTECTOMY    . COLONOSCOPY WITH ESOPHAGOGASTRODUODENOSCOPY (EGD)  06/12/2012   Procedure: COLONOSCOPY WITH ESOPHAGOGASTRODUODENOSCOPY (EGD);  Surgeon: Daneil Dolin, MD;  Location: [facility];  Service: Endoscopy;  Laterality: N/A;  3:00 PM  . LAPAROSCOPIC GASTRIC BANDING  2009   Dr. Hassell Done     Allergies  Allergen Reactions  . Codeine Nausea And Vomiting and Rash      Family History  Problem Relation Age of Onset  . Heart attack Mother        living  . Heart attack Father        living  . Diabetes Father   . Colon cancer Neg Hx      Social History Mr. Riano reports that he quit smoking about 30 years ago. His smoking use included cigarettes. He started smoking about 40 years ago. He has a 10.00 pack-year smoking history. His smokeless tobacco use includes snuff. Mr. Golab reports that he drinks alcohol.   Review of Systems CONSTITUTIONAL: No weight loss, fever, chills, weakness or fatigue.  HEENT: Eyes: No visual loss, blurred vision, double  vision or yellow sclerae.No hearing loss, sneezing, congestion, runny nose or sore throat.  SKIN: No rash or itching.  CARDIOVASCULAR: per hpi RESPIRATORY: No shortness of breath, cough or sputum.  GASTROINTESTINAL: No anorexia, nausea, vomiting or diarrhea. No abdominal pain or blood.  GENITOURINARY: No burning on urination, no polyuria NEUROLOGICAL: No headache, dizziness, syncope, paralysis, ataxia, numbness or tingling in the extremities. No change in bowel or bladder control.  MUSCULOSKELETAL: No muscle, back pain, joint pain or stiffness.  LYMPHATICS: No enlarged nodes. No history of splenectomy.  PSYCHIATRIC: No history of  depression or anxiety.  ENDOCRINOLOGIC: No reports of sweating, cold or heat intolerance. No polyuria or polydipsia.  Marland Kitchen   Physical Examination Vitals:   04/26/18 0910  BP: 126/80  Pulse: 74  SpO2: 97%   Vitals:   04/26/18 0910  Weight: (!) 350 lb 6.4 oz (158.9 kg)  Height: 6' (1.829 m)    Gen: resting comfortably, no acute distress HEENT: no scleral icterus, pupils equal round and reactive, no palptable cervical adenopathy,  CV: RRR, no m/r/g, no jvd Resp: Clear to auscultation bilaterally GI: abdomen is soft, non-tender, non-distended, normal bowel sounds, no hepatosplenomegaly MSK: extremities are warm, no edema.  Skin: warm, no rash Neuro:  no focal deficits Psych: appropriate affect   Diagnostic Studies 10/2009 echo Study Conclusions  - Left ventricle: The cavity size was normal. Wall thickness was  increased in a pattern of mild LVH. Systolic function was normal.  The estimated ejection fraction was in the range of 55% to 60%.  Wall motion was normal; there were no regional wall motion  abnormalities. - Left atrium: The atrium was mildly dilated.   06/2015 Holter monitor  Predominant rhythm is normal sinus  There is rate ventricular ectopy, no supraventricular ectopy  Max HR 127, min HR 50, Avg HF 78  Symptoms of palpitations correspond with normal sinus rhythm with just occasional isolated PVCs.  No significant arrhythmias    Assessment and Plan   1. Palpitations - holter without significant arrhythmias. - continue current medical therapy, symptoms tolerable at this time - EKG today shows NSR   2. HTN - at goal, continue current meds  3. AKI - repeat labs, elevated Cr 12/2017 during ER visiti with kidney stone, has not had repeat check  Stop ASA for primary prevention based on new ACC/AHA guideliens.   Check annual labs   F/u 1 year  Arnoldo Lenis, M.D.

## 2018-09-04 ENCOUNTER — Ambulatory Visit (HOSPITAL_COMMUNITY)
Admission: RE | Admit: 2018-09-04 | Discharge: 2018-09-04 | Disposition: A | Discharge status: Home / Self Care | Payer: BC Managed Care – PPO | Source: Ambulatory Visit | Attending: Pulmonary Disease | Admitting: Pulmonary Disease

## 2018-09-04 ENCOUNTER — Other Ambulatory Visit (HOSPITAL_COMMUNITY): Payer: Self-pay | Admitting: Pulmonary Disease

## 2018-09-04 DIAGNOSIS — M25561 Pain in right knee: Secondary | ICD-10-CM

## 2018-09-04 DIAGNOSIS — M25562 Pain in left knee: Principal | ICD-10-CM

## 2018-09-19 ENCOUNTER — Ambulatory Visit: Payer: BC Managed Care – PPO | Admitting: Orthopedic Surgery

## 2018-09-19 ENCOUNTER — Encounter

## 2018-09-19 ENCOUNTER — Encounter: Payer: Self-pay | Admitting: Orthopedic Surgery

## 2018-09-19 VITALS — BP 122/86 | HR 74 | Ht 72.0 in | Wt 348.0 lb

## 2018-09-19 DIAGNOSIS — M25562 Pain in left knee: Secondary | ICD-10-CM | POA: Diagnosis not present

## 2018-09-19 DIAGNOSIS — M1712 Unilateral primary osteoarthritis, left knee: Secondary | ICD-10-CM | POA: Diagnosis not present

## 2018-09-19 DIAGNOSIS — M2242 Chondromalacia patellae, left knee: Secondary | ICD-10-CM

## 2018-09-19 NOTE — Patient Instructions (Signed)
Meniscus Tear    A meniscus tear is a knee injury that happens when a piece of the meniscus is torn. The meniscus is a thick, rubbery, wedge-shaped cartilage in the knee. Two menisci are located in each knee. They sit between the upper bone (femur) and lower bone (tibia) that make up the knee joint. Each meniscus acts as a shock absorber for the knee.  A torn meniscus is one of the most common types of knee injuries. This injury can range from mild to severe. Surgery may be needed to repair a severe tear.  What are the causes?  This condition may be caused by any kneeling, squatting, twisting, or pivoting movement. Sports-related injuries are the most common cause. These often occur from:  · Running and stopping suddenly.  ? Changing direction.  ? Being tackled or knocked off your feet.  · Lifting or carrying heavy weights.  As people get older, their menisci get thinner and weaker. In these people, tears can happen more easily, such as from climbing stairs.  What increases the risk?  You are more likely to develop this condition if you:  · Play contact sports.  · Have a job that requires kneeling or squatting.  · Are male.  · Are over 40 years old.  What are the signs or symptoms?  Symptoms of this condition include:  · Knee pain, especially at the side of the knee joint. You may feel pain when the injury occurs, or you may only hear a pop and feel pain later.  · A feeling that your knee is clicking, catching, locking, or giving way.  · Not being able to fully bend or extend your knee.  · Bruising or swelling in your knee.  How is this diagnosed?  This condition may be diagnosed based on your symptoms and a physical exam.  You may also have tests, such as:  · X-rays.  · MRI.  · A procedure to look inside your knee with a narrow surgical telescope (arthroscopy).  You may be referred to a knee specialist (orthopedic surgeon).  How is this treated?  Treatment for this injury depends on the severity of the tear.  Treatment for a mild tear may include:  · Rest.  · Medicine to reduce pain and swelling. This is usually a nonsteroidal anti-inflammatory drug (NSAID), like ibuprofen.  · A knee brace, sleeve, or wrap.  · Using crutches or a walker to keep weight off your knee and to help you walk.  · Exercises to strengthen your knee (physical therapy).  You may need surgery if you have a severe tear or if other treatments are not working.  Follow these instructions at home:  If you have a brace, sleeve, or wrap:  · Wear it as told by your health care provider. Remove it only as told by your health care provider.  · Loosen the brace, sleeve, or wrap if your toes tingle, become numb, or turn cold and blue.  · Keep the brace, sleeve, or wrap clean and dry.  · If the brace, sleeve, or wrap is not waterproof:  ? Do not let it get wet.  ? Cover it with a watertight covering when you take a bath or shower.  Managing pain and swelling    · Take over-the-counter and prescription medicines only as told by your health care provider.  · If directed, put ice on your knee:  ? If you have a removable brace, sleeve, or wrap, remove it   as told by your health care provider.  ? Put ice in a plastic bag.  ? Place a towel between your skin and the bag.  ? Leave the ice on for 20 minutes, 2-3 times per day.  · Move your toes often to avoid stiffness and to lessen swelling.  · Raise (elevate) the injured area above the level of your heart while you are sitting or lying down.  Activity  · Do not use the injured limb to support your body weight until your health care provider says that you can. Use crutches or a walker as told by your health care provider.  · Return to your normal activities as told by your health care provider. Ask your health care provider what activities are safe for you.  · Perform range-of-motion exercises only as told by your health care provider.  · Begin doing exercises to strengthen your knee and leg muscles only as told by your  health care provider. After you recover, your health care provider may recommend these exercises to help prevent another injury.  General instructions  · Use a knee brace, sleeve, or wrap as told by your health care provider.  · Ask your health care provider when it is safe to drive if you have a brace, sleeve, or wrap on your knee.  · Do not use any products that contain nicotine or tobacco, such as cigarettes, e-cigarettes, and chewing tobacco. If you need help quitting, ask your health care provider.  · Ask your health care provider if the medicine prescribed to you:  ? Requires you to avoid driving or using heavy machinery.  ? Can cause constipation. You may need to take these actions to prevent or treat constipation:  § Drink enough fluid to keep your urine pale yellow.  § Take over-the-counter or prescription medicines.  § Eat foods that are high in fiber, such as beans, whole grains, and fresh fruits and vegetables.  § Limit foods that are high in fat and processed sugars, such as fried or sweet foods.  · Keep all follow-up visits as told by your health care provider. This is important.  Contact a health care provider if:  · You have a fever.  · Your knee becomes red, tender, or swollen.  · Your pain medicine is not helping.  · Your symptoms get worse or do not improve after 2 weeks of home care.  Summary  · A meniscus tear is a knee injury that happens when a piece of the meniscus is torn.  · Treatment for this injury depends on the severity of the tear. You may need surgery if you have a severe tear or if other treatments are not working.  · Rest, ice, and raise (elevate) your injured knee as told by your health care provider. This will help lessen pain and swelling.  · Contact a health care provider if you have new symptoms, or your symptoms get worse or do not improve after 2 weeks of home care.  · Keep all follow-up visits as told by your health care provider. This is important.  This information is not  intended to replace advice given to you by your health care provider. Make sure you discuss any questions you have with your health care provider.  Document Released: 10/15/2002 Document Revised: 02/06/2018 Document Reviewed: 02/06/2018  Elsevier Interactive Patient Education © 2019 [employer]

## 2018-09-19 NOTE — Progress Notes (Signed)
NEW PATIENT OFFICE VISIT  Chief Complaint  Patient presents with  . Knee Pain    58 year old male comes in complaining of left knee pain.  He was squatting 1 day or kneeling and then noticed pain on the medial joint line inferior to the patella across the patellar tendon with occasional giving way symptoms dull aching mild to moderate pain which is actually getting better now with only infrequent giving way symptoms   Review of Systems  Constitutional: Negative for chills, fever and weight loss.  Respiratory: Negative for shortness of breath.   Cardiovascular: Negative for chest pain.  Neurological: Negative for tingling.     Past Medical History:  Diagnosis Date  . GERD (gastroesophageal reflux disease)   . Hypertension   . Obesity   . Sleep apnea    was tested, but had lap band surgery and with weight loss, no problems    Past Surgical History:  Procedure Laterality Date  . ANTERIOR CERVICAL DECOMP/DISCECTOMY FUSION N/A 08/15/2014   Procedure: ANTERIOR CERVICAL DECOMPRESSION/DISCECTOMY FUSION 1 LEVEL;  Surgeon: Consuella Lose, MD;  Location: [facility];  Service: Neurosurgery;  Laterality: N/A;  C56 anterior cervical fusion with interbody prosthesis plating and bonegraft C 5-6  . CHOLECYSTECTOMY    . COLONOSCOPY WITH ESOPHAGOGASTRODUODENOSCOPY (EGD)  06/12/2012   Procedure: COLONOSCOPY WITH ESOPHAGOGASTRODUODENOSCOPY (EGD);  Surgeon: Daneil Dolin, MD;  Location: [facility];  Service: Endoscopy;  Laterality: N/A;  3:00 PM  . LAPAROSCOPIC GASTRIC BANDING  2009   Dr. Hassell Done    Family History  Problem Relation Age of Onset  . Heart attack Mother        living  . Heart attack Father        living  . Diabetes Father   . Colon cancer Neg Hx    Social History   Tobacco Use  . Smoking status: Former Smoker    Packs/day: 1.00    Years: 10.00    Pack years: 10.00    Types: Cigarettes    Start date: 05/23/1977    Last attempt to quit: 05/24/1987    Years since  quitting: 31.3  . Smokeless tobacco: Current User    Types: Snuff  Substance Use Topics  . Alcohol use: Yes    Alcohol/week: 0.0 standard drinks    Comment: occassional  . Drug use: No    Allergies  Allergen Reactions  . Codeine Nausea And Vomiting and Rash    Current Meds  Medication Sig  . AZOR 5-20 MG tablet TAKE ONE (1) TABLET BY MOUTH EVERY DAY  . dexlansoprazole (DEXILANT) 60 MG capsule Take 60 mg by mouth 2 (two) times daily.   Marland Kitchen loratadine (CLARITIN) 10 MG tablet Take 10 mg by mouth daily.  . metoprolol tartrate (LOPRESSOR) 50 MG tablet Take 1 tablet (50 mg total) by mouth 2 (two) times daily.  . Multiple Vitamins-Minerals (MULTIVITAMIN WITH MINERALS) tablet Take 1 tablet by mouth daily. Juice Plus    BP 122/86   Pulse 74   Ht 6' (1.829 m)   Wt (!) 348 lb (157.9 kg)   BMI 47.20 kg/m   Physical Exam Vitals signs reviewed.  Constitutional:      Appearance: Normal appearance. He is well-developed.  Skin:    General: Skin is warm and dry.     Findings: No erythema.  Neurological:     Mental Status: He is alert and oriented to person, place, and time.     Gait: Gait normal.  Psychiatric:  Mood and Affect: Mood and affect normal.     Right Knee Exam   Muscle Strength  The patient has normal right knee strength.  Tenderness  The patient is experiencing no tenderness.   Range of Motion  Extension: normal  Flexion: normal   Tests  McMurray:  Medial - negative Lateral - negative Varus: negative Valgus: negative Drawer:  Anterior - negative    Posterior - negative  Other  Erythema: absent Scars: absent Sensation: normal Pulse: present Swelling: none   Left Knee Exam   Muscle Strength  The patient has normal left knee strength.  Tenderness  The patient is experiencing tenderness in the medial joint line.  Range of Motion  Extension: normal  Flexion: normal   Tests  McMurray:  Medial - positive Lateral - negative Varus: negative  Valgus: negative Drawer:  Anterior - negative     Posterior - negative  Other  Erythema: absent Scars: absent Sensation: normal Pulse: present Swelling: none        MEDICAL DECISION SECTION  Xrays were done at [facility]  My independent reading of xrays:  4 views of the knee patella centered bone quality looks good knee is in n probable slight varus alignment there is narrowing of the medial compartment mild subchondral sclerosis no other secondary bone changes  Impression mild arthritis left knee  Encounter Diagnoses  Name Primary?  . Acute pain of left knee Yes  . Chondromalacia of left patella   . Primary osteoarthritis of left knee     PLAN: (Rx., injectx, surgery, frx, mri/ct) The patient is getting better so right now we are going to treat him observation if he gets better he will call us and we will get him in and set up an MRI and possible surgery  No orders of the defined types were placed in this encounter.   Arther Abbott, MD  09/19/2018 12:02 PM

## 2019-02-11 ENCOUNTER — Emergency Department (HOSPITAL_COMMUNITY)
Admission: EM | Admit: 2019-02-11 | Discharge: 2019-02-12 | Disposition: A | Discharge status: Home / Self Care | Payer: BC Managed Care – PPO | Attending: Emergency Medicine | Admitting: Emergency Medicine

## 2019-02-11 ENCOUNTER — Other Ambulatory Visit: Payer: Self-pay

## 2019-02-11 ENCOUNTER — Encounter (HOSPITAL_COMMUNITY): Payer: Self-pay | Admitting: *Deleted

## 2019-02-11 DIAGNOSIS — N23 Unspecified renal colic: Secondary | ICD-10-CM | POA: Diagnosis not present

## 2019-02-11 DIAGNOSIS — Z79899 Other long term (current) drug therapy: Secondary | ICD-10-CM | POA: Diagnosis not present

## 2019-02-11 DIAGNOSIS — N289 Disorder of kidney and ureter, unspecified: Secondary | ICD-10-CM

## 2019-02-11 DIAGNOSIS — I10 Essential (primary) hypertension: Secondary | ICD-10-CM | POA: Diagnosis not present

## 2019-02-11 DIAGNOSIS — F17220 Nicotine dependence, chewing tobacco, uncomplicated: Secondary | ICD-10-CM | POA: Insufficient documentation

## 2019-02-11 DIAGNOSIS — R1031 Right lower quadrant pain: Secondary | ICD-10-CM | POA: Diagnosis present

## 2019-02-11 LAB — CBC
HCT: 44.7 % (ref 39.0–52.0)
Hemoglobin: 13.9 g/dL (ref 13.0–17.0)
MCH: 29.9 pg (ref 26.0–34.0)
MCHC: 31.1 g/dL (ref 30.0–36.0)
MCV: 96.1 fL (ref 80.0–100.0)
Platelets: 185 10*3/uL (ref 150–400)
RBC: 4.65 MIL/uL (ref 4.22–5.81)
RDW: 14.6 % (ref 11.5–15.5)
WBC: 7.8 10*3/uL (ref 4.0–10.5)
nRBC: 0 % (ref 0.0–0.2)

## 2019-02-11 MED ORDER — SODIUM CHLORIDE 0.9% FLUSH: 3.0000 mL | Freq: Once | INTRAVENOUS | Status: DC

## 2019-02-11 NOTE — ED Triage Notes (Signed)
Pt states he felt like he needed to have a BM over an hour ago and he went to the bathroom; while he was having a BM he started having severe lower right side abdominal pain; pt states he has had 3 BMs since the initial one and the abdominal pain is still present just not as severe

## 2019-02-12 ENCOUNTER — Emergency Department (HOSPITAL_COMMUNITY): Payer: BC Managed Care – PPO

## 2019-02-12 LAB — COMPREHENSIVE METABOLIC PANEL
ALT: 28 U/L (ref 0–44)
AST: 20 U/L (ref 15–41)
Albumin: 3.9 g/dL (ref 3.5–5.0)
Alkaline Phosphatase: 55 U/L (ref 38–126)
Anion gap: 10 (ref 5–15)
BUN: 22 mg/dL — ABNORMAL HIGH (ref 6–20)
CO2: 27 mmol/L (ref 22–32)
Calcium: 8.5 mg/dL — ABNORMAL LOW (ref 8.9–10.3)
Chloride: 103 mmol/L (ref 98–111)
Creatinine, Ser: 1.59 mg/dL — ABNORMAL HIGH (ref 0.61–1.24)
GFR calc Af Amer: 55 mL/min — ABNORMAL LOW (ref >60)
GFR calc non Af Amer: 48 mL/min — ABNORMAL LOW (ref >60)
Glucose, Bld: 138 mg/dL — ABNORMAL HIGH (ref 70–99)
Potassium: 4.8 mmol/L (ref 3.5–5.1)
Sodium: 140 mmol/L (ref 135–145)
Total Bilirubin: 0.4 mg/dL (ref 0.3–1.2)
Total Protein: 7.1 g/dL (ref 6.5–8.1)

## 2019-02-12 LAB — URINALYSIS, ROUTINE W REFLEX MICROSCOPIC
Bacteria, UA: NONE SEEN
Bilirubin Urine: NEGATIVE
Glucose, UA: NEGATIVE mg/dL
Ketones, ur: NEGATIVE mg/dL
Leukocytes,Ua: NEGATIVE
Nitrite: NEGATIVE
Protein, ur: NEGATIVE mg/dL
Specific Gravity, Urine: 1.029 (ref 1.005–1.030)
pH: 5 (ref 5.0–8.0)

## 2019-02-12 LAB — LIPASE, BLOOD: Lipase: 30 U/L (ref 11–51)

## 2019-02-12 MED ORDER — KETOROLAC TROMETHAMINE 30 MG/ML IJ SOLN
30.0000 mg | Freq: Once | INTRAMUSCULAR | Status: AC
  Administered 2019-02-12: 30 mg via INTRAVENOUS
  Filled 2019-02-12: qty 1

## 2019-02-12 MED ORDER — SODIUM CHLORIDE 0.9 % IV BOLUS
1000.0000 mL | Freq: Once | INTRAVENOUS | Status: AC
  Administered 2019-02-12: 1000 mL via INTRAVENOUS

## 2019-02-12 MED ORDER — ONDANSETRON HCL 4 MG/2ML IJ SOLN
4.0000 mg | Freq: Once | INTRAMUSCULAR | Status: AC
  Administered 2019-02-12: 4 mg via INTRAVENOUS
  Filled 2019-02-12: qty 2

## 2019-02-12 MED ORDER — MORPHINE SULFATE (PF) 4 MG/ML IV SOLN
4.0000 mg | Freq: Once | INTRAVENOUS | Status: AC
  Administered 2019-02-12: 4 mg via INTRAVENOUS
  Filled 2019-02-12: qty 1

## 2019-02-12 NOTE — Discharge Instructions (Addendum)
Your CT scan looks like you had a kidney stone which has already passed.  Drink plenty of fluids.  Take acetaminophen as needed for pain.  Return if pain is not being adequately controlled at home.

## 2019-02-12 NOTE — ED Provider Notes (Signed)
[facility] EMERGENCY DEPARTMENT Provider Note   CSN: [id number] Arrival date & time: 02/11/19  2251    History   Chief Complaint Chief Complaint  Patient presents with   Abdominal Pain    HPI Patrick Chapman is a 58 y.o. male.   The history is provided by the patient.  He has history of hypertension, hyperlipidemia, GERD, morbid obesity, kidney stones and comes in with right lower quadrant pain which started about 9:15 PM.  This was associated with a sense that he had to defecate.  He passed a small amount of stool, but pain really did not change.  Now, he starting to have pain in the right flank.  He is rating pain at 7/10.  There is associated nausea and he did have some chills at home.  He has not vomited, but he is status post lap band surgery and is unable to vomit.  He had several other small bowel movements which have not changed his symptoms.  He took acetaminophen at home without relief.  Past Medical History:  Diagnosis Date   GERD (gastroesophageal reflux disease)    Hypertension    Obesity    Sleep apnea    was tested, but had lap band surgery and with weight loss, no problems    Patient Active Problem List   Diagnosis Date Noted   Morbid obesity ([place]) 04/26/2017   Palpitations 04/25/2017   Cervical spondylosis with radiculopathy 08/15/2014   LUQ abdominal pain 01/14/2011   GERD (gastroesophageal reflux disease) 11/25/2010   Epigastric pain 11/25/2010   Hyperlipidemia 10/16/2009   CHEST PAIN UNSPECIFIED 10/16/2009   OVERWEIGHT 10/13/2009   Essential hypertension 10/13/2009    Past Surgical History:  Procedure Laterality Date   ANTERIOR CERVICAL DECOMP/DISCECTOMY FUSION N/A 08/15/2014   Procedure: ANTERIOR CERVICAL DECOMPRESSION/DISCECTOMY FUSION 1 LEVEL;  Surgeon: Consuella Lose, MD;  Location: [place] [facility];  Service: Neurosurgery;  Laterality: N/A;  C56 anterior cervical fusion with interbody prosthesis plating and bonegraft C 5-6    CHOLECYSTECTOMY     COLONOSCOPY WITH ESOPHAGOGASTRODUODENOSCOPY (EGD)  06/12/2012   Procedure: COLONOSCOPY WITH ESOPHAGOGASTRODUODENOSCOPY (EGD);  Surgeon: Daneil Dolin, MD;  [facility];  Service: Endoscopy;  Laterality: N/A;  3:00 PM   LAPAROSCOPIC GASTRIC BANDING  2009   Dr. Hassell Done        Home Medications    Prior to Admission medications   Medication Sig Start Date End Date Taking? Authorizing Provider  AZOR 5-20 MG tablet TAKE ONE (1) TABLET BY MOUTH EVERY DAY 03/20/17   Arnoldo Lenis, MD  dexlansoprazole (DEXILANT) 60 MG capsule Take 60 mg by mouth 2 (two) times daily.  06/15/11   Mahala Menghini, PA-C  loratadine (CLARITIN) 10 MG tablet Take 10 mg by mouth daily.    [provider]  metoprolol tartrate (LOPRESSOR) 50 MG tablet Take 1 tablet (50 mg total) by mouth 2 (two) times daily. 01/23/18   Arnoldo Lenis, MD  Multiple Vitamins-Minerals (MULTIVITAMIN WITH MINERALS) tablet Take 1 tablet by mouth daily. Juice Plus    [provider]    Family History Family History  Problem Relation Age of Onset   Heart attack Mother        living   Heart attack Father        living   Diabetes Father    Colon cancer Neg Hx     Social History Social History   Tobacco Use   Smoking status: Former Smoker  Packs/day: 1.00    Years: 10.00    Pack years: 10.00    Types: Cigarettes    Start date: 05/23/1977    Quit date: 05/24/1987    Years since quitting: 31.7   Smokeless tobacco: Current User    Types: Snuff  Substance Use Topics   Alcohol use: Yes    Alcohol/week: 0.0 standard drinks    Comment: occassional   Drug use: No     Allergies   Codeine   Review of Systems Review of Systems  All other systems reviewed and are negative.    Physical Exam Updated Vital Signs BP (!) 137/59 (BP Location: Left Arm)    Pulse 77    Temp 98 F (36.7 C) (Oral)    Resp 16    Ht 6' (1.829 m)    Wt (!) 149.7 kg    SpO2 99%    BMI  44.76 kg/m   Physical Exam Vitals signs and nursing note reviewed.    Morbidly obese 58 year old male, resting comfortably and in no acute distress. Vital signs are normal. Oxygen saturation is 99%, which is normal. Head is normocephalic and atraumatic. PERRLA, EOMI. Oropharynx is clear. Neck is nontender and supple without adenopathy or JVD. Back is nontender and there is no CVA tenderness. Lungs are clear without rales, wheezes, or rhonchi. Chest is nontender. Heart has regular rate and rhythm without murmur. Abdomen is soft, flat, nontender without masses or hepatosplenomegaly and peristalsis is hypoactive. Extremities have no cyanosis or edema, full range of motion is present. Skin is warm and dry without rash. Neurologic: Mental status is normal, cranial nerves are intact, there are no motor or sensory deficits.  ED Treatments / Results  Labs (all labs ordered are listed, but only abnormal results are displayed) Labs Reviewed  COMPREHENSIVE METABOLIC PANEL - Abnormal; Notable for the following components:      Result Value   Glucose, Bld 138 (*)    BUN 22 (*)    Creatinine, Ser 1.59 (*)    Calcium 8.5 (*)    GFR calc non Af Amer 48 (*)    GFR calc Af Amer 55 (*)    All other components within normal limits  URINALYSIS, ROUTINE W REFLEX MICROSCOPIC - Abnormal; Notable for the following components:   APPearance HAZY (*)    Hgb urine dipstick MODERATE (*)    All other components within normal limits  LIPASE, BLOOD  CBC   Radiology Ct Renal Stone Study  Result Date: 02/12/2019 CLINICAL DATA:  Flank pain.  Lower right-sided abdominal pain. EXAM: CT ABDOMEN AND PELVIS WITHOUT CONTRAST TECHNIQUE: Multidetector CT imaging of the abdomen and pelvis was performed following the standard protocol without IV contrast. COMPARISON:  Dec 22, 2017. FINDINGS: Lower chest: The lung bases are clear. The heart size is normal. Hepatobiliary: The liver is normal. Normal gallbladder.There is no  biliary ductal dilation. Pancreas: Normal contours without ductal dilatation. No peripancreatic fluid collection. Spleen: No splenic laceration or hematoma. Adrenals/Urinary Tract: --Adrenal glands: No adrenal hemorrhage. --Right kidney/ureter: There is interval increase in fat stranding and free fluid about the right kidney. There is mild right-sided collecting system dilatation without evidence of a radiopaque obstructing kidney stone. --Left kidney/ureter: No hydronephrosis or perinephric hematoma. --Urinary bladder: The bladder is decompressed and therefore poorly evaluated. Stomach/Bowel: --Stomach/Duodenum: There is a lap band in place with overall appropriate positioning. --Small bowel: No dilatation or inflammation. --Colon: No focal abnormality. --Appendix: Normal. Vascular/Lymphatic: Atherosclerotic calcification is present within  the non-aneurysmal abdominal aorta, without hemodynamically significant stenosis. --No retroperitoneal lymphadenopathy. --No mesenteric lymphadenopathy. --No pelvic or inguinal lymphadenopathy. Reproductive: Unremarkable Other: No ascites or free air. The abdominal wall is normal. Musculoskeletal. No acute displaced fractures. IMPRESSION: 1. Mild collecting system dilatation involving the right kidney with interval increase in fat stranding and free fluid surrounding the right kidney. There is no radiopaque kidney stone. Findings may represent sequela of a recently passed stone or ascending urinary tract infection. Correlation with urinalysis is recommended. 2. Unremarkable appearance of the left kidney. 3. Normal appendix in the right lower quadrant. 4. Status post cholecystectomy. Aortic Atherosclerosis (ICD10-I70.0). Electronically Signed   By: Constance Holster M.D.   On: 02/12/2019 01:48    Procedures Procedures   Medications Ordered in ED Medications  sodium chloride flush (NS) 0.9 % injection 3 mL (has no administration in time range)     Initial Impression /  Assessment and Plan / ED Course  I have reviewed the triage vital signs and the nursing notes.  Pertinent labs & imaging results that were available during my care of the patient were reviewed by me and considered in my medical decision making (see chart for details).  Right lower quadrant pain with radiation to the back suspicious for urolithiasis.  Consider appendicitis, diverticulitis, pancreatitis.  Old records are reviewed, and he had been seen in the ED 1 year ago for a kidney stone on the left, but there were no residual renal calculi seen at that time.  I have reviewed the images personally.  Today, labs show normal WBC.  He has an elevated creatinine not significantly changed from baseline.  Lipase is normal.  Urinalysis is pending.  He will be given IV fluids, morphine, ketorolac, ondansetron and will be sent for renal stone protocol CT scan.  Urinalysis does show microscopic hematuria.  CT scan is consistent with either a sending urinary tract infection or recently passed kidney stone on the right.  Clinical presentation and urinalysis both are much more consistent with recently passed ureteral calculus.  I gone back to talk with the patient and he is currently pain-free.  With calculus apparently having passed, he should not need any additional pain medication.  I have explained the findings to him, advised to return if he has recurrence of symptoms.  Final Clinical Impressions(s) / ED Diagnoses   Final diagnoses:  Renal colic  Renal insufficiency    ED Discharge Orders    None       Delora Fuel, MD 62[birth year]/33 [phone]

## 2019-03-01 ENCOUNTER — Other Ambulatory Visit: Payer: Self-pay | Admitting: Pulmonary Disease

## 2019-03-01 DIAGNOSIS — M25511 Pain in right shoulder: Secondary | ICD-10-CM

## 2019-03-12 ENCOUNTER — Other Ambulatory Visit: Payer: Self-pay | Admitting: Urology

## 2019-03-12 ENCOUNTER — Encounter: Payer: Self-pay | Admitting: Internal Medicine

## 2019-03-15 NOTE — Patient Instructions (Signed)
YOU NEED TO HAVE A COVID 19 TEST ON_8/8______ @_______ , THIS TEST MUST BE DONE BEFORE SURGERY, COME  Patrick Chapman , [id number]. ONCE YOUR COVID TEST IS COMPLETED, PLEASE BEGIN THE QUARANTINE INSTRUCTIONS AS OUTLINED IN YOUR HANDOUT.                Patrick Chapman   Your procedure is scheduled on:  Wednesday 03/20/19   Report to [facility] Main  Entrance Report to admitting at  8:57 AM   1 VISITOR IS ALLOWED TO WAIT IN WAITING ROOM  ONLY DAY OF YOUR SURGERY.    Call this number if you have problems the morning of surgery [phone]    Remember: Do not eat food or drink liquids :After Midnight.  BRUSH YOUR TEETH MORNING OF SURGERY AND RINSE YOUR MOUTH OUT, NO CHEWING GUM CANDY OR MINTS.     Take these medicines the morning of surgery with A SIP OF WATER:  Metoprolol, Flomax, Dexilant                                 You may not have any metal on your body including piercings              Do not wear jewelry, make-up, lotions, powders or perfumes, deodorant        .              Men may shave face and neck.   Do not bring valuables to the hospital. [place].  Contacts, dentures or bridgework may not be worn into surgery.       Patients discharged the day of surgery will not be allowed to drive home . IF YOU ARE HAVING SURGERY AND GOING HOME THE SAME DAY, YOU MUST HAVE AN ADULT TO DRIVE YOU HOME AND BE WITH YOU FOR 24 HOURS.  YOU MAY GO HOME BY TAXI OR UBER OR ORTHERWISE, BUT AN ADULT MUST ACCOMPANY YOU HOME AND STAY WITH YOU FOR 24 HOURS.  Name and phone number of your driver:  Special Instructions: N/A              Please read over the following fact sheets you were given: _____________________________________________________________________   [facility] - Preparing for Surgery  Before surgery, you can play an important role.   Because skin is not sterile, your skin needs to be as free of germs  as possible.   You can reduce the number of germs on your skin by washing with CHG (chlorahexidine gluconate) soap before surgery.   CHG is an antiseptic cleaner which kills germs and bonds with the skin to continue killing germs even after washing. Please DO NOT use if you have an allergy to CHG or antibacterial soaps.   If your skin becomes reddened/irritated stop using the CHG and inform your nurse when you arrive at Short Stay. .  You may shave your face/neck. Please follow these instructions carefully:  1.  Shower with CHG Soap the night before surgery and the  morning of Surgery.  2.  If you choose to wash your hair, wash your hair first as usual with your  normal  shampoo.  3.  After you shampoo, rinse your hair and body thoroughly to remove the  shampoo.  4.  Use CHG as you would any other liquid soap.  You can apply chg directly  to the skin and wash                       Gently with a scrungie or clean washcloth.  5.  Apply the CHG Soap to your body ONLY FROM THE NECK DOWN.   Do not use on face/ open                           Wound or open sores. Avoid contact with eyes, ears mouth and genitals (private parts).                       Wash face,  Genitals (private parts) with your normal soap.             6.  Wash thoroughly, paying special attention to the area where your surgery  will be performed.  7.  Thoroughly rinse your body with warm water from the neck down.  8.  DO NOT shower/wash with your normal soap after using and rinsing off  the CHG Soap.             9.  Pat yourself dry with a clean towel.            10.  Wear clean pajamas.            11.  Place clean sheets on your bed the night of your first shower and do not  sleep with pets. Day of Surgery : Do not apply any lotions/deodorants the morning of surgery.  Please wear clean clothes to the hospital/surgery center.  FAILURE TO FOLLOW THESE INSTRUCTIONS MAY RESULT IN THE CANCELLATION  OF YOUR SURGERY PATIENT SIGNATURE_________________________________  NURSE SIGNATURE__________________________________  ________________________________________________________________________

## 2019-03-16 ENCOUNTER — Other Ambulatory Visit (HOSPITAL_COMMUNITY)
Admission: RE | Admit: 2019-03-16 | Discharge: 2019-03-16 | Disposition: A | Discharge status: Home / Self Care | Payer: BC Managed Care – PPO | Source: Ambulatory Visit | Attending: Urology | Admitting: Urology

## 2019-03-16 DIAGNOSIS — Z20828 Contact with and (suspected) exposure to other viral communicable diseases: Secondary | ICD-10-CM | POA: Diagnosis not present

## 2019-03-16 DIAGNOSIS — Z01812 Encounter for preprocedural laboratory examination: Secondary | ICD-10-CM | POA: Insufficient documentation

## 2019-03-17 LAB — SARS CORONAVIRUS 2 (TAT 6-24 HRS): SARS Coronavirus 2: NEGATIVE

## 2019-03-18 ENCOUNTER — Encounter (HOSPITAL_COMMUNITY)
Admission: RE | Admit: 2019-03-18 | Discharge: 2019-03-18 | Disposition: A | Discharge status: Home / Self Care | Payer: BC Managed Care – PPO | Source: Ambulatory Visit | Attending: Urology | Admitting: Urology

## 2019-03-18 ENCOUNTER — Other Ambulatory Visit: Payer: Self-pay

## 2019-03-18 ENCOUNTER — Encounter (HOSPITAL_COMMUNITY): Payer: Self-pay

## 2019-03-18 DIAGNOSIS — N211 Calculus in urethra: Secondary | ICD-10-CM | POA: Insufficient documentation

## 2019-03-18 DIAGNOSIS — Z01812 Encounter for preprocedural laboratory examination: Secondary | ICD-10-CM | POA: Insufficient documentation

## 2019-03-18 LAB — CBC
HCT: 43.9 % (ref 39.0–52.0)
Hemoglobin: 13.7 g/dL (ref 13.0–17.0)
MCH: 30.4 pg (ref 26.0–34.0)
MCHC: 31.2 g/dL (ref 30.0–36.0)
MCV: 97.3 fL (ref 80.0–100.0)
Platelets: 214 10*3/uL (ref 150–400)
RBC: 4.51 MIL/uL (ref 4.22–5.81)
RDW: 13.7 % (ref 11.5–15.5)
WBC: 8.3 10*3/uL (ref 4.0–10.5)
nRBC: 0 % (ref 0.0–0.2)

## 2019-03-18 LAB — BASIC METABOLIC PANEL
Anion gap: 8 (ref 5–15)
BUN: 23 mg/dL — ABNORMAL HIGH (ref 6–20)
CO2: 26 mmol/L (ref 22–32)
Calcium: 9 mg/dL (ref 8.9–10.3)
Chloride: 106 mmol/L (ref 98–111)
Creatinine, Ser: 1.88 mg/dL — ABNORMAL HIGH (ref 0.61–1.24)
GFR calc Af Amer: 45 mL/min — ABNORMAL LOW (ref >60)
GFR calc non Af Amer: 39 mL/min — ABNORMAL LOW (ref >60)
Glucose, Bld: 114 mg/dL — ABNORMAL HIGH (ref 70–99)
Potassium: 5 mmol/L (ref 3.5–5.1)
Sodium: 140 mmol/L (ref 135–145)

## 2019-03-19 MED ORDER — DEXTROSE 5 % IV SOLN
3.0000 g | Freq: Once | INTRAVENOUS | Status: DC
  Filled 2019-03-19: qty 3000

## 2019-03-20 ENCOUNTER — Encounter (HOSPITAL_COMMUNITY): Admission: RE | Payer: Self-pay | Source: Home / Self Care

## 2019-03-20 ENCOUNTER — Ambulatory Visit (HOSPITAL_COMMUNITY): Admission: RE | Admit: 2019-03-20 | Payer: BC Managed Care – PPO | Source: Home / Self Care | Admitting: Urology

## 2019-03-20 SURGERY — CYSTOSCOPY/URETEROSCOPY/HOLMIUM LASER/STENT PLACEMENT
Anesthesia: General | Laterality: Left

## 2019-03-26 ENCOUNTER — Encounter: Payer: Self-pay | Admitting: Gastroenterology

## 2019-03-26 ENCOUNTER — Other Ambulatory Visit: Payer: Self-pay

## 2019-03-26 ENCOUNTER — Ambulatory Visit (INDEPENDENT_AMBULATORY_CARE_PROVIDER_SITE_OTHER): Payer: BC Managed Care – PPO | Admitting: Gastroenterology

## 2019-03-26 VITALS — BP 120/77 | HR 70 | Temp 97.0°F | Ht 72.0 in | Wt 343.6 lb

## 2019-03-26 DIAGNOSIS — R197 Diarrhea, unspecified: Secondary | ICD-10-CM

## 2019-03-26 DIAGNOSIS — Z860100 Personal history of colon polyps, unspecified: Secondary | ICD-10-CM

## 2019-03-26 DIAGNOSIS — K219 Gastro-esophageal reflux disease without esophagitis: Secondary | ICD-10-CM | POA: Diagnosis not present

## 2019-03-26 NOTE — Progress Notes (Signed)
Primary Care Physician:  Sinda Du, MD  Primary Gastroenterologist:  Garfield Cornea, MD   Chief Complaint  Patient presents with  . Diarrhea    Mostly daily but will miss a day. up to 3-4 times in a day, denies blood  . Abdominal Pain    "cramping prior to diarrhea"    HPI:  Patrick Chapman is a 58 y.o. male here at the request of Dr. Luan Pulling for further evaluation of diarrhea.  Previously seen back in 2013 with GERD issues screening colonoscopy.  Patient has a history of laparoscopic gastric band placement in 2009 by Dr. Hassell Done.  Preoperative weight was 370 pounds.  He weighs 343 pounds today.  EGD showed prior lap band placement, colonoscopy with 2 tubular adenomas removed.  Next colonoscopy recommended in 2018.  Patient gives prior diagnosis of IBS-D.  Presents with 1 year history of intermittent diarrhea.  Generally first bowel movement daily solid and subsequent stools are loose.  Has several BMs in the morning.  Has to drive to [facility] several times per week, will have to stop 3-4 times times on the way to have a bowel movement.  Describes the stools as explosive.  Denies melena or rectal bleeding.  No nocturnal stools.  Occasional normal bowel movements.  Frequent postprandial loose stools, does not seem to matter what he eats.  Imodium helps but then recurrent symptoms.  Reflux poorly controlled.  Complains of nocturnal reflux, wakes up coughing.  Takes Dexilant daily and TUMS.  No dysphagia or vomiting.  Recalls that he initially started having significant diarrhea right after his gallbladder was removed this seemed to clear up in about a year.   Currently having rotator cuff issues, likely surgery in the near future.  Current Outpatient Medications  Medication Sig Dispense Refill  . acetaminophen (TYLENOL) 500 MG tablet Take 1,000 mg by mouth every 6 (six) hours as needed for moderate pain or headache.    . AZOR 5-20 MG tablet TAKE ONE (1) TABLET BY MOUTH EVERY DAY  (Patient taking differently: Take 1 tablet by mouth daily. ) 15 tablet 0  . dexlansoprazole (DEXILANT) 60 MG capsule Take 60 mg by mouth daily.     Marland Kitchen loratadine (CLARITIN) 10 MG tablet Take 10 mg by mouth daily.    . metoprolol tartrate (LOPRESSOR) 50 MG tablet Take 1 tablet (50 mg total) by mouth 2 (two) times daily. 60 tablet 3  . tamsulosin (FLOMAX) 0.4 MG CAPS capsule Take 0.4 mg by mouth daily.     No current facility-administered medications for this visit.     Allergies as of 03/26/2019 - Review Complete 03/18/2019  Allergen Reaction Noted  . Codeine Nausea And Vomiting and Rash 10/16/2009    Past Medical History:  Diagnosis Date  . GERD (gastroesophageal reflux disease)   . Hypertension   . Obesity   . Sleep apnea    was tested, but had lap band surgery and with weight loss, no problems    Past Surgical History:  Procedure Laterality Date  . ANTERIOR CERVICAL DECOMP/DISCECTOMY FUSION N/A 08/15/2014   Procedure: ANTERIOR CERVICAL DECOMPRESSION/DISCECTOMY FUSION 1 LEVEL;  Surgeon: Consuella Lose, MD;  Location: [place] [facility];  Service: Neurosurgery;  Laterality: N/A;  C56 anterior cervical fusion with interbody prosthesis plating and bonegraft C 5-6  . CHOLECYSTECTOMY    . COLONOSCOPY WITH ESOPHAGOGASTRODUODENOSCOPY (EGD)  06/12/2012   Procedure: COLONOSCOPY WITH ESOPHAGOGASTRODUODENOSCOPY (EGD);  Surgeon: Daneil Dolin, MD;  [facility];  Service: Endoscopy;  Laterality: N/A;  3:00 PM  . LAPAROSCOPIC GASTRIC BANDING  2009   Dr. Hassell Done    Family History  Problem Relation Age of Onset  . Heart attack Mother        living  . Heart attack Father        living  . Diabetes Father   . Colon cancer Neg Hx     Social History   Socioeconomic History  . Marital status: Married    Spouse name: Not on file  . Number of children: Not on file  . Years of education: Not on file  . Highest education level: Not on file  Occupational History  . Not on file   Social Needs  . Financial resource strain: Not on file  . Food insecurity    Worry: Not on file    Inability: Not on file  . Transportation needs    Medical: Not on file    Non-medical: Not on file  Tobacco Use  . Smoking status: Former Smoker    Packs/day: 1.00    Years: 10.00    Pack years: 10.00    Types: Cigarettes    Start date: 05/23/1977    Quit date: 05/24/1987    Years since quitting: 31.8  . Smokeless tobacco: Current User    Types: Snuff  Substance and Sexual Activity  . Alcohol use: Yes    Alcohol/week: 0.0 standard drinks    Comment: occassional  . Drug use: No  . Sexual activity: Yes    Partners: Female    Birth control/protection: Surgical    Comment: spouse  Lifestyle  . Physical activity    Days per week: Not on file    Minutes per session: Not on file  . Stress: Not on file  Relationships  . Social Herbalist on phone: Not on file    Gets together: Not on file    Attends religious service: Not on file    Active member of club or organization: Not on file    Attends meetings of clubs or organizations: Not on file    Relationship status: Not on file  . Intimate partner violence    Fear of current or ex partner: Not on file    Emotionally abused: Not on file    Physically abused: Not on file    Forced sexual activity: Not on file  Other Topics Concern  . Not on file  Social History Narrative  . Not on file      ROS:  General: Negative for anorexia, weight loss, fever, chills, fatigue, weakness. Eyes: Negative for vision changes.  ENT: Negative for hoarseness, difficulty swallowing , nasal congestion. CV: Negative for chest pain, angina, palpitations, dyspnea on exertion, peripheral edema.  Respiratory: Negative for dyspnea at rest, dyspnea on exertion, cough, sputum, wheezing.  GI: See history of present illness. GU:  Negative for dysuria, hematuria, urinary incontinence, urinary frequency, nocturnal urination.  MS: Positive  joint pain, negative low back pain.  Derm: Negative for rash or itching.  Neuro: Negative for weakness, abnormal sensation, seizure, frequent headaches, memory loss, confusion.  Psych: Negative for anxiety, depression, suicidal ideation, hallucinations.  Endo: Negative for unusual weight change.  Heme: Negative for bruising or bleeding. Allergy: Negative for rash or hives.    Physical Examination:  BP 120/77   Pulse 70   Temp (!) 97 F (36.1 C) (Oral)   Ht 6' (1.829 m)   Wt 243 lb 9.6 oz (110.5 kg)  BMI 33.04 kg/m    General: Well-nourished, well-developed in no acute distress.  Head: Normocephalic, atraumatic.   Eyes: Conjunctiva pink, no icterus. Mouth: Oropharyngeal mucosa moist and pink , no lesions erythema or exudate. Neck: Supple without thyromegaly, masses, or lymphadenopathy.  Lungs: Clear to auscultation bilaterally.  Heart: Regular rate and rhythm, no murmurs rubs or gallops.  Abdomen: Bowel sounds are normal, nontender, nondistended, no hepatosplenomegaly or masses, no abdominal bruits or    hernia , no rebound or guarding.   Rectal: Not performed Extremities: No lower extremity edema. No clubbing or deformities.  Neuro: Alert and oriented x 4 , grossly normal neurologically.  Skin: Warm and dry, no rash or jaundice.   Psych: Alert and cooperative, normal mood and affect.  Labs: Lab Results  Component Value Date   CREATININE 1.88 (H) 03/18/2019   BUN 23 (H) 03/18/2019   NA 140 03/18/2019   K 5.0 03/18/2019   CL 106 03/18/2019   CO2 26 03/18/2019   Lab Results  Component Value Date   ALT 28 02/11/2019   AST 20 02/11/2019   ALKPHOS 55 02/11/2019   BILITOT 0.4 02/11/2019   Lab Results  Component Value Date   WBC 8.3 03/18/2019   HGB 13.7 03/18/2019   HCT 43.9 03/18/2019   MCV 97.3 03/18/2019   PLT 214 03/18/2019   Lab Results  Component Value Date   LIPASE 30 02/11/2019     Imaging Studies: No results found.

## 2019-03-26 NOTE — Assessment & Plan Note (Signed)
1 year history of persistent diarrhea.  Previous postcholecystectomy diarrhea in the past which seemed to resolve.  Remotely told he IBS.  Suspect IBS-D but need to exclude other possibilities.  Check labs, screen for celiac, stool studies.  If unremarkable, would offer trial of antispasmodic versus Questran or Colestid.  He is due for colonoscopy for surveillance purposes for history of colon adenomas but would like to postpone for the immediate future given his shoulder issues.  Await pending results.  Further recommendations to follow.

## 2019-03-26 NOTE — Assessment & Plan Note (Signed)
Due for surveillance colonoscopy at this time.  Patient is interested in pursuing prior to a year but currently has clear-cut issue with possible pending surgery.  Await stool studies.

## 2019-03-26 NOTE — Patient Instructions (Signed)
1. Please complete stools and labs. We will contact you with results as available and discuss next step in work up of your diarrhea.  2. You are due a colonoscopy at any time for history of colon polyps. Await stools/labs and then we will work towards scheduling.  3. Try to avoid eating or drinking within two hours of laying down to decrease chances of regurgitation of stomach contents/reflux.    Gastroesophageal Reflux Disease, Adult Gastroesophageal reflux (GER) happens when acid from the stomach flows up into the tube that connects the mouth and the stomach (esophagus). Normally, food travels down the esophagus and stays in the stomach to be digested. However, when a person has GER, food and stomach acid sometimes move back up into the esophagus. If this becomes a more serious problem, the person may be diagnosed with a disease called gastroesophageal reflux disease (GERD). GERD occurs when the reflux:  Happens often.  Causes frequent or severe symptoms.  Causes problems such as damage to the esophagus. When stomach acid comes in contact with the esophagus, the acid may cause soreness (inflammation) in the esophagus. Over time, GERD may create small holes (ulcers) in the lining of the esophagus. What are the causes? This condition is caused by a problem with the muscle between the esophagus and the stomach (lower esophageal sphincter, or LES). Normally, the LES muscle closes after food passes through the esophagus to the stomach. When the LES is weakened or abnormal, it does not close properly, and that allows food and stomach acid to go back up into the esophagus. The LES can be weakened by certain dietary substances, medicines, and medical conditions, including:  Tobacco use.  Pregnancy.  Having a hiatal hernia.  Alcohol use.  Certain foods and beverages, such as coffee, chocolate, onions, and peppermint. What increases the risk? You are more likely to develop this condition if you:   Have an increased body weight.  Have a connective tissue disorder.  Use NSAID medicines. What are the signs or symptoms? Symptoms of this condition include:  Heartburn.  Difficult or painful swallowing.  The feeling of having a lump in the throat.  Abitter taste in the mouth.  Bad breath.  Having a large amount of saliva.  Having an upset or bloated stomach.  Belching.  Chest pain. Different conditions can cause chest pain. Make sure you see your health care provider if you experience chest pain.  Shortness of breath or wheezing.  Ongoing (chronic) cough or a night-time cough.  Wearing away of tooth enamel.  Weight loss. How is this diagnosed? Your health care provider will take a medical history and perform a physical exam. To determine if you have mild or severe GERD, your health care provider may also monitor how you respond to treatment. You may also have tests, including:  A test to examine your stomach and esophagus with a small camera (endoscopy).  A test thatmeasures the acidity level in your esophagus.  A test thatmeasures how much pressure is on your esophagus.  A barium swallow or modified barium swallow test to show the shape, size, and functioning of your esophagus. How is this treated? The goal of treatment is to help relieve your symptoms and to prevent complications. Treatment for this condition may vary depending on how severe your symptoms are. Your health care provider may recommend:  Changes to your diet.  Medicine.  Surgery. Follow these instructions at home: Eating and drinking   Follow a diet as recommended by your  health care provider. This may involve avoiding foods and drinks such as: ? Coffee and tea (with or without caffeine). ? Drinks that containalcohol. ? Energy drinks and sports drinks. ? Carbonated drinks or sodas. ? Chocolate and cocoa. ? Peppermint and mint flavorings. ? Garlic and onions. ? Horseradish. ? Spicy  and acidic foods, including peppers, chili powder, curry powder, vinegar, hot sauces, and barbecue sauce. ? Citrus fruit juices and citrus fruits, such as oranges, lemons, and limes. ? Tomato-based foods, such as red sauce, chili, salsa, and pizza with red sauce. ? Fried and fatty foods, such as donuts, french fries, potato chips, and high-fat dressings. ? High-fat meats, such as hot dogs and fatty cuts of red and white meats, such as rib eye steak, sausage, ham, and bacon. ? High-fat dairy items, such as whole milk, butter, and cream cheese.  Eat small, frequent meals instead of large meals.  Avoid drinking large amounts of liquid with your meals.  Avoid eating meals during the 2-3 hours before bedtime.  Avoid lying down right after you eat.  Do not exercise right after you eat. Lifestyle   Do not use any products that contain nicotine or tobacco, such as cigarettes, e-cigarettes, and chewing tobacco. If you need help quitting, ask your health care provider.  Try to reduce your stress by using methods such as yoga or meditation. If you need help reducing stress, ask your health care provider.  If you are overweight, reduce your weight to an amount that is healthy for you. Ask your health care provider for guidance about a safe weight loss goal. General instructions  Pay attention to any changes in your symptoms.  Take over-the-counter and prescription medicines only as told by your health care provider. Do not take aspirin, ibuprofen, or other NSAIDs unless your health care provider told you to do so.  Wear loose-fitting clothing. Do not wear anything tight around your waist that causes pressure on your abdomen.  Raise (elevate) the head of your bed about 6 inches (15 cm).  Avoid bending over if this makes your symptoms worse.  Keep all follow-up visits as told by your health care provider. This is important. Contact a health care provider if:  You have: ? New symptoms. ?  Unexplained weight loss. ? Difficulty swallowing or it hurts to swallow. ? Wheezing or a persistent cough. ? A hoarse voice.  Your symptoms do not improve with treatment. Get help right away if you:  Have pain in your arms, neck, jaw, teeth, or back.  Feel sweaty, dizzy, or light-headed.  Have chest pain or shortness of breath.  Vomit and your vomit looks like blood or coffee grounds.  Faint.  Have stool that is bloody or black.  Cannot swallow, drink, or eat. Summary  Gastroesophageal reflux happens when acid from the stomach flows up into the esophagus. GERD is a disease in which the reflux happens often, causes frequent or severe symptoms, or causes problems such as damage to the esophagus.  Treatment for this condition may vary depending on how severe your symptoms are. Your health care provider may recommend diet and lifestyle changes, medicine, or surgery.  Contact a health care provider if you have new or worsening symptoms.  Take over-the-counter and prescription medicines only as told by your health care provider. Do not take aspirin, ibuprofen, or other NSAIDs unless your health care provider told you to do so.  Keep all follow-up visits as told by your health care provider. This is  important. This information is not intended to replace advice given to you by your health care provider. Make sure you discuss any questions you have with your health care provider. Document Released: 05/04/2005 Document Revised: 01/31/2018 Document Reviewed: 01/31/2018 Elsevier Patient Education  2020 [place] for Gastroesophageal Reflux Disease, Adult When you have gastroesophageal reflux disease (GERD), the foods you eat and your eating habits are very important. Choosing the right foods can help ease the discomfort of GERD. Consider working with a diet and nutrition specialist (dietitian) to help you make healthy food choices. What general guidelines should I follow?   Eating plan  Choose healthy foods low in fat, such as fruits, vegetables, whole grains, low-fat dairy products, and lean meat, fish, and poultry.  Eat frequent, small meals instead of three large meals each day. Eat your meals slowly, in a relaxed setting. Avoid bending over or lying down until 2-3 hours after eating.  Limit high-fat foods such as fatty meats or fried foods.  Limit your intake of oils, butter, and shortening to less than 8 teaspoons each day.  Avoid the following: ? Foods that cause symptoms. These may be different for different people. Keep a food diary to keep track of foods that cause symptoms. ? Alcohol. ? Drinking large amounts of liquid with meals. ? Eating meals during the 2-3 hours before bed.  Cook foods using methods other than frying. This may include baking, grilling, or broiling. Lifestyle  Maintain a healthy weight. Ask your health care provider what weight is healthy for you. If you need to lose weight, work with your health care provider to do so safely.  Exercise for at least 30 minutes on 5 or more days each week, or as told by your health care provider.  Avoid wearing clothes that fit tightly around your waist and chest.  Do not use any products that contain nicotine or tobacco, such as cigarettes and e-cigarettes. If you need help quitting, ask your health care provider.  Sleep with the head of your bed raised. Use a wedge under the mattress or blocks under the bed frame to raise the head of the bed. What foods are not recommended? The items listed may not be a complete list. Talk with your dietitian about what dietary choices are best for you. Grains Pastries or quick breads with added fat. [place] toast. Vegetables Deep fried vegetables. [place] fries. Any vegetables prepared with added fat. Any vegetables that cause symptoms. For some people this may include tomatoes and tomato products, chili peppers, onions and garlic, and horseradish.  Fruits Any fruits prepared with added fat. Any fruits that cause symptoms. For some people this may include citrus fruits, such as oranges, grapefruit, pineapple, and lemons. Meats and other protein foods High-fat meats, such as fatty beef or pork, hot dogs, ribs, ham, sausage, salami and bacon. Fried meat or protein, including fried fish and fried chicken. Nuts and nut butters. Dairy Whole milk and chocolate milk. Sour cream. Cream. Ice cream. Cream cheese. Milk shakes. Beverages Coffee and tea, with or without caffeine. Carbonated beverages. Sodas. Energy drinks. Fruit juice made with acidic fruits (such as orange or grapefruit). Tomato juice. Alcoholic drinks. Fats and oils Butter. Margarine. Shortening. Ghee. Sweets and desserts Chocolate and cocoa. Donuts. Seasoning and other foods Pepper. Peppermint and spearmint. Any condiments, herbs, or seasonings that cause symptoms. For some people, this may include curry, hot sauce, or vinegar-based salad dressings. Summary  When you have gastroesophageal reflux disease (GERD),  food and lifestyle choices are very important to help ease the discomfort of GERD.  Eat frequent, small meals instead of three large meals each day. Eat your meals slowly, in a relaxed setting. Avoid bending over or lying down until 2-3 hours after eating.  Limit high-fat foods such as fatty meat or fried foods. This information is not intended to replace advice given to you by your health care provider. Make sure you discuss any questions you have with your health care provider. Document Released: 07/25/2005 Document Revised: 11/15/2018 Document Reviewed: 07/26/2016 Elsevier Patient Education  2020 [employer].

## 2019-03-26 NOTE — Assessment & Plan Note (Signed)
Poorly controlled.  Complains mostly of nocturnal symptoms with regurgitation.  Admittedly eats and drinks hard laying down.  He should wait at least 2 hours after eating/drinking prior to laying down.  Continue Dexilant 60 mg daily.  May continue Tums as needed.  Recommend to continue weight loss efforts.  Patient involved in weight loss program today.  If persisting symptoms, could consider vomiting H2 blocker.

## 2019-04-01 ENCOUNTER — Ambulatory Visit
Admission: RE | Admit: 2019-04-01 | Discharge: 2019-04-01 | Disposition: A | Discharge status: Home / Self Care | Payer: BC Managed Care – PPO | Source: Ambulatory Visit | Attending: Pulmonary Disease | Admitting: Pulmonary Disease

## 2019-04-01 ENCOUNTER — Other Ambulatory Visit: Payer: Self-pay

## 2019-04-01 DIAGNOSIS — M25511 Pain in right shoulder: Secondary | ICD-10-CM

## 2019-05-29 ENCOUNTER — Ambulatory Visit (INDEPENDENT_AMBULATORY_CARE_PROVIDER_SITE_OTHER): Payer: PRIVATE HEALTH INSURANCE | Admitting: Orthopedic Surgery

## 2019-05-29 ENCOUNTER — Other Ambulatory Visit: Payer: Self-pay

## 2019-05-29 VITALS — BP 115/74 | HR 66 | Temp 97.3°F | Ht 72.0 in | Wt 312.0 lb

## 2019-05-29 DIAGNOSIS — M25511 Pain in right shoulder: Secondary | ICD-10-CM

## 2019-05-29 DIAGNOSIS — G8929 Other chronic pain: Secondary | ICD-10-CM | POA: Diagnosis not present

## 2019-05-29 MED ORDER — DICLOFENAC POTASSIUM 50 MG PO TABS: 50.0000 mg | ORAL_TABLET | Freq: Two times a day (BID) | ORAL | 3 refills | Status: DC

## 2019-05-29 MED ORDER — TIZANIDINE HCL 4 MG PO TABS: 4.0000 mg | ORAL_TABLET | Freq: Every day | ORAL | 1 refills | Status: DC

## 2019-05-29 MED ORDER — GABAPENTIN 100 MG PO CAPS: 100.0000 mg | ORAL_CAPSULE | Freq: Three times a day (TID) | ORAL | 2 refills | Status: DC

## 2019-05-29 NOTE — Patient Instructions (Signed)
You have received an injection of steroids into the joint. 15% of patients will have increased pain within the 24 hours postinjection.   This is transient and will go away.   We recommend that you use ice packs on the injection site for 20 minutes every 2 hours and extra strength Tylenol 2 tablets every 8 as needed until the pain resolves.  If you continue to have pain after taking the Tylenol and using the ice please call the office for further instructions.   We will order physical therapy you should receive a phone call from the PT department. If you havent in 3 days please call us to follow up on it    You will have nsaid gabapentin and muscle relaxer

## 2019-05-29 NOTE — Progress Notes (Signed)
Patrick Chapman  05/29/2019  HISTORY SECTION :  Chief Complaint  Patient presents with  . Shoulder Pain    Right shoulder pain.   HPI The patient presents for evaluation of right shoulder Location of pain superior angle of right scapula and subacromial space Duration off-and-on since December currently not doing that badly although he still stiff Quality dull ache Severity currently mild but can become severe Associated with loss of motion  58 year old male says last December started having shoulder pain does not report any trauma he says he has waxing and waning periods of exacerbation is okay today.  The pain goes across his neck and back up to the scapula and then he has some pain in the subacromial space where has pain when he sleeps on his shoulder most of the pain however is in the scapula he has had no treatment  Review of Systems  Gastrointestinal: Positive for heartburn.  All other systems reviewed and are negative.    has a past medical history of GERD (gastroesophageal reflux disease), Hypertension, Obesity, and Sleep apnea.   Past Surgical History:  Procedure Laterality Date  . ANTERIOR CERVICAL DECOMP/DISCECTOMY FUSION N/A 08/15/2014   Procedure: ANTERIOR CERVICAL DECOMPRESSION/DISCECTOMY FUSION 1 LEVEL;  Surgeon: Consuella Lose, MD;  Location: [facility];  Service: Neurosurgery;  Laterality: N/A;  C56 anterior cervical fusion with interbody prosthesis plating and bonegraft C 5-6  . CHOLECYSTECTOMY    . COLONOSCOPY WITH ESOPHAGOGASTRODUODENOSCOPY (EGD)  06/12/2012   Dr. Gala Romney: Status post prior lap band placement, otherwise normal study.  2 tubular adenomas removed from the colon.  Next colonoscopy in 5 years.  [facility] LAPAROSCOPIC GASTRIC BANDING  2009   Dr. Hassell Done    Body mass index is 42.31 kg/m.   Allergies  Allergen Reactions  . Codeine Nausea And Vomiting and Rash     Current Outpatient Medications:  .  acetaminophen (TYLENOL) 500 MG tablet, Take 1,000  mg by mouth every 6 (six) hours as needed for moderate pain or headache., Disp: , Rfl:  .  AZOR 5-20 MG tablet, TAKE ONE (1) TABLET BY MOUTH EVERY DAY (Patient taking differently: Take 1 tablet by mouth daily. ), Disp: 15 tablet, Rfl: 0 .  dexlansoprazole (DEXILANT) 60 MG capsule, Take 60 mg by mouth daily. , Disp: , Rfl:  .  loratadine (CLARITIN) 10 MG tablet, Take 10 mg by mouth daily., Disp: , Rfl:  .  metoprolol tartrate (LOPRESSOR) 50 MG tablet, Take 1 tablet (50 mg total) by mouth 2 (two) times daily., Disp: 60 tablet, Rfl: 3 .  tamsulosin (FLOMAX) 0.4 MG CAPS capsule, Take 0.4 mg by mouth daily., Disp: , Rfl:  .  diclofenac (CATAFLAM) 50 MG tablet, Take 1 tablet (50 mg total) by mouth 2 (two) times daily., Disp: 90 tablet, Rfl: 3 .  gabapentin (NEURONTIN) 100 MG capsule, Take 1 capsule (100 mg total) by mouth 3 (three) times daily., Disp: 90 capsule, Rfl: 2 .  tiZANidine (ZANAFLEX) 4 MG tablet, Take 1 tablet (4 mg total) by mouth daily., Disp: 30 tablet, Rfl: 1   PHYSICAL EXAM SECTION: 1) BP 115/74   Pulse 66   Temp (!) 97.3 F (36.3 C)   Ht 6' (1.829 m)   Wt (!) 312 lb (141.5 kg)   BMI 42.31 kg/m   Body mass index is 42.31 kg/m. General appearance: Well-developed well-nourished no gross deformities  2) Cardiovascular normal pulse and perfusion in the upper extremities normal color without edema  3) Neurologically deep tendon  reflexes are equal and normal, no sensation loss or deficits no pathologic reflexes  4) Psychological: Awake alert and oriented x3 mood and affect normal  5) Skin no lacerations or ulcerations no nodularity no palpable masses, no erythema or nodularity  6) Musculoskeletal:   Left shoulder full range of motion no tenderness stability is normal strength is excellent skin is normal  Right shoulder tender at the superior angle of the scapula and right under the posterior portion of the acromion no anterior tenderness alignment is normal range of motion is  limited in internal rotation to the front pocket may be just to the back pocket 30 degrees external rotation 110 degrees flexion and 80 degrees abduction this is active and passive otherwise shoulder stable muscle strength is normal at the side but has some weakness in abduction and flexion skin is intact   MEDICAL DECISION SECTION:  Encounter Diagnoses  Name Primary?  . Chronic right shoulder pain Yes  . Trigger point of shoulder region, right     Imaging maging MRI does show a AC joint arthrosis but no cuff tear  Report has been saved in the chart   Plan:  (Rx., Inj., surg., Frx, MRI/CT, XR:2)  Recommend trigger point injection  Subacromial injection  Physical therapy  Medication for pain spasm and nerve pain   Procedure note the subacromial injection shoulder RIGHT    Verbal consent was obtained to inject the  RIGHT   Shoulder  Timeout was completed to confirm the injection site is a subacromial space of the  RIGHT  shoulder   Medication used Depo-Medrol 40 mg and lidocaine 1% 3 cc  Anesthesia was provided by ethyl chloride  The injection was performed in the RIGHT  posterior subacromial space. After pinning the skin with alcohol and anesthetized the skin with ethyl chloride the subacromial space was injected using a 20-gauge needle. There were no complications  Sterile dressing was applied.  Meds ordered this encounter  Medications  . gabapentin (NEURONTIN) 100 MG capsule    Sig: Take 1 capsule (100 mg total) by mouth 3 (three) times daily.    Dispense:  90 capsule    Refill:  2  . diclofenac (CATAFLAM) 50 MG tablet    Sig: Take 1 tablet (50 mg total) by mouth 2 (two) times daily.    Dispense:  90 tablet    Refill:  3  . tiZANidine (ZANAFLEX) 4 MG tablet    Sig: Take 1 tablet (4 mg total) by mouth daily.    Dispense:  30 tablet    Refill:  1   I will see him in 4 to 6 weeks  12:22 PM Arther Abbott, MD  05/29/2019

## 2019-06-04 ENCOUNTER — Telehealth: Payer: Self-pay | Admitting: Orthopedic Surgery

## 2019-06-04 NOTE — Telephone Encounter (Signed)
Patrick Chapman called stating that he had not gotten any calls from [facility] for PT so he called them.  He was told that they didn't have an order for him.   Can you resend order to [facility] for this patient please?  Thanks

## 2019-06-04 NOTE — Telephone Encounter (Signed)
Faxed again since it was not received.

## 2019-06-11 ENCOUNTER — Telehealth: Payer: Self-pay

## 2019-06-11 NOTE — Telephone Encounter (Signed)
Spoke with [facility] and when pt arrived on Friday, it was after our office closed. There was another code needed in order to cover the TSH lab test. The long term medication code was given and accepted. Pt will be able to have his labs drawn without the expensive fee. Spoke with pt and he will have his lab work completed today.

## 2019-06-11 NOTE — Telephone Encounter (Signed)
Pt left Vm in my box that he turned in stool studies on Friday and for some reason his blood work wasn't done. He said there was a problem and he thought the lab was going to get in touch with [place].  I do not see a note in here and Elmo Putt said she did not get a call about it. She told me to route this message to her and she will follow up on it.

## 2019-06-12 LAB — GASTROINTESTINAL PATHOGEN PANEL PCR
C. difficile Tox A/B, PCR: DETECTED — AB
Campylobacter, PCR: NOT DETECTED
Cryptosporidium, PCR: NOT DETECTED
E coli (ETEC) LT/ST PCR: NOT DETECTED
E coli (STEC) stx1/stx2, PCR: NOT DETECTED
E coli 0157, PCR: NOT DETECTED
Giardia lamblia, PCR: NOT DETECTED
Norovirus, PCR: NOT DETECTED
Rotavirus A, PCR: NOT DETECTED
Salmonella, PCR: NOT DETECTED
Shigella, PCR: NOT DETECTED

## 2019-06-12 LAB — C. DIFFICILE GDH AND TOXIN A/B
GDH ANTIGEN: NOT DETECTED
MICRO NUMBER:: 1050564
SPECIMEN QUALITY:: ADEQUATE
TOXIN A AND B: NOT DETECTED

## 2019-06-18 ENCOUNTER — Other Ambulatory Visit: Payer: Self-pay

## 2019-06-18 ENCOUNTER — Telehealth: Payer: Self-pay

## 2019-06-18 MED ORDER — VANCOMYCIN HCL 125 MG PO CAPS: 125.0000 mg | ORAL_CAPSULE | Freq: Four times a day (QID) | ORAL | 0 refills | Status: AC

## 2019-06-18 NOTE — Telephone Encounter (Signed)
T/C from [place] at [place], he cannot get the Vanco today. He tried to call pt to see if OK to send to [facility] and could not reach him. I called pt's wife and she said pt is out of town at this moment but will return this evening. She said Ok to send Rx to [facility] [place] and they are aware to transfer Rx to [facility].  Sending FYI to [employer].

## 2019-06-18 NOTE — Telephone Encounter (Signed)
noted 

## 2019-06-20 ENCOUNTER — Telehealth: Payer: Self-pay | Admitting: Gastroenterology

## 2019-06-20 NOTE — Telephone Encounter (Signed)
PT is aware the Vanco is for the C diff PCR being positive. He has picked it up and started it. He is aware to keep Dec appointment.

## 2019-06-20 NOTE — Telephone Encounter (Signed)
Pt said he has been taking an antibiotic and is aware of his DEC OV, but he wanted to know what he was being treated for. Please call [phone]

## 2019-07-09 ENCOUNTER — Ambulatory Visit (INDEPENDENT_AMBULATORY_CARE_PROVIDER_SITE_OTHER): Payer: PRIVATE HEALTH INSURANCE | Admitting: Gastroenterology

## 2019-07-09 ENCOUNTER — Other Ambulatory Visit: Payer: Self-pay

## 2019-07-09 ENCOUNTER — Encounter: Payer: Self-pay | Admitting: Gastroenterology

## 2019-07-09 ENCOUNTER — Other Ambulatory Visit: Payer: Self-pay | Admitting: *Deleted

## 2019-07-09 ENCOUNTER — Encounter: Payer: Self-pay | Admitting: *Deleted

## 2019-07-09 VITALS — BP 122/83 | HR 63 | Temp 96.9°F | Ht 72.0 in | Wt 300.0 lb

## 2019-07-09 DIAGNOSIS — Z86010 Personal history of colonic polyps: Secondary | ICD-10-CM

## 2019-07-09 DIAGNOSIS — R197 Diarrhea, unspecified: Secondary | ICD-10-CM

## 2019-07-09 MED ORDER — PEG 3350-KCL-NA BICARB-NACL 420 G PO SOLR: ORAL | 0 refills | Status: DC

## 2019-07-09 MED ORDER — DICYCLOMINE HCL 10 MG PO CAPS: 10.0000 mg | ORAL_CAPSULE | Freq: Three times a day (TID) | ORAL | 3 refills | Status: DC

## 2019-07-09 NOTE — Patient Instructions (Addendum)
1. Start bentyl one capsule before meals and at bedtime up to four times daily for diarrhea.  2. Colonoscopy as scheduled. See separate instructions. 3. If you decide you want to pursue CT scan for left flank pain, let me know.

## 2019-07-09 NOTE — Progress Notes (Signed)
Primary Care Physician: Sinda Du, MD  Primary Gastroenterologist:  Garfield Cornea, MD   Chief Complaint  Patient presents with  . Diarrhea    still having diarrhea since finishing [facility]    HPI: Patrick Chapman is a 58 y.o. male here for follow-up of diarrhea.  Seen back in August with 1 year history of intermittent diarrhea. Actually however, reports he has had bouts of diarrhea for years but had been more difficult to manage for the past one year.  Generally first bowel movement a day solid followed by subsequent multiple loose stools.  Has made his work difficult as he travels several times per week.  Describes stools as explosive.   EGD and colonoscopy in 2013 with evidence of prior lap band placement.  2 tubular adenomas removed from the colon.  Next colonoscopy recommended in 2018.  Day of last OV in 03/2019, he started [place]. He has lost 43 pounds since August. He noted diarrhea was much better for the first 3 months on the plan and so he did not complete stool studies until last month when the diarrhea returned. C. difficile GDH toxin A+B were negative.  GI pathogen panel negative except for positive C. difficile toxin A+B PCR.  Elected to treat him with vancomycin for 10 days.  He never completed celiac serologies and TSH as ordered.  Patient states his diarrhea improved on vancomycin. He completed one week ago. Came off [employer] plan for Thanksgiving and diarrhea returned. Currently takes Imodium day before he has to travel with work to [place], [place]. He does not like to use public restrooms and also less availability due to [place]. He denies melena, brbpr. No nocturnal stools. Complains of one year h/o left flank pain. has been told he had kidney stones, passed three in the past year. He has had CT renal protocol but no contrast study. Denies h/o back issues. Mother diagnosed with a cancer, had surgery. Cancer was "on her colon and started with a P". Has caused him some concern.       Current Outpatient Medications  Medication Sig Dispense Refill  . acetaminophen (TYLENOL) 500 MG tablet Take 1,000 mg by mouth every 6 (six) hours as needed for moderate pain or headache.    . AZOR 5-20 MG tablet TAKE ONE (1) TABLET BY MOUTH EVERY DAY (Patient taking differently: Take 1 tablet by mouth daily. ) 15 tablet 0  . dexlansoprazole (DEXILANT) 60 MG capsule Take 60 mg by mouth daily.     Marland Kitchen loratadine (CLARITIN) 10 MG tablet Take 10 mg by mouth daily.    . metoprolol tartrate (LOPRESSOR) 50 MG tablet Take 1 tablet (50 mg total) by mouth 2 (two) times daily. 60 tablet 3  . tamsulosin (FLOMAX) 0.4 MG CAPS capsule Take 0.4 mg by mouth daily.     No current facility-administered medications for this visit.     Allergies as of 07/09/2019 - Review Complete 05/29/2019  Allergen Reaction Noted  . Codeine Nausea And Vomiting and Rash 10/16/2009   Past Medical History:  Diagnosis Date  . GERD (gastroesophageal reflux disease)   . Hypertension   . Obesity   . Sleep apnea    was tested, but had lap band surgery and with weight loss, no problems   Past Surgical History:  Procedure Laterality Date  . ANTERIOR CERVICAL DECOMP/DISCECTOMY FUSION N/A 08/15/2014   Procedure: ANTERIOR CERVICAL DECOMPRESSION/DISCECTOMY FUSION 1 LEVEL;  Surgeon: Consuella Lose, MD;  Location: [facility];  Service: Neurosurgery;  Laterality: N/A;  C56 anterior cervical fusion with interbody prosthesis plating and bonegraft C 5-6  . CHOLECYSTECTOMY    . COLONOSCOPY WITH ESOPHAGOGASTRODUODENOSCOPY (EGD)  06/12/2012   Dr. Gala Romney: Status post prior lap band placement, otherwise normal study.  2 tubular adenomas removed from the colon.  Next colonoscopy in 5 years.  Marland Kitchen LAPAROSCOPIC GASTRIC BANDING  2009   Dr. Hassell Done   Family History  Problem Relation Age of Onset  . Heart attack Mother        living  . Heart attack Father        living  . Diabetes Father   . Colon cancer Neg Hx    Social History    Tobacco Use  . Smoking status: Former Smoker    Packs/day: 1.00    Years: 10.00    Pack years: 10.00    Types: Cigarettes    Start date: 05/23/1977    Quit date: 05/24/1987    Years since quitting: 32.1  . Smokeless tobacco: Current User    Types: Snuff  Substance Use Topics  . Alcohol use: Not Currently    Alcohol/week: 0.0 standard drinks    Comment: occassional  . Drug use: No    ROS:  General: Negative for anorexia, weight loss, fever, chills, fatigue, weakness. ENT: Negative for hoarseness, difficulty swallowing , nasal congestion. CV: Negative for chest pain, angina, palpitations, dyspnea on exertion, peripheral edema.  Respiratory: Negative for dyspnea at rest, dyspnea on exertion, cough, sputum, wheezing.  GI: See history of present illness. GU:  Negative for dysuria, hematuria, urinary incontinence, urinary frequency, nocturnal urination.  Endo: Negative for unusual weight change.    Physical Examination:   BP 122/83   Pulse 63   Temp (!) 96.9 F (36.1 C) (Temporal)   Ht 6' (1.829 m)   Wt 300 lb (136.1 kg)   BMI 40.69 kg/m   General: Well-nourished, well-developed in no acute distress.  Eyes: No icterus. Mouth: Oropharyngeal mucosa moist and pink , no lesions erythema or exudate. Lungs: Clear to auscultation bilaterally.  Heart: Regular rate and rhythm, no murmurs rubs or gallops.  Abdomen: Bowel sounds are normal, nontender, nondistended, no hepatosplenomegaly or masses, no abdominal bruits or hernia , no rebound or guarding.   Extremities: No lower extremity edema. No clubbing or deformities. Neuro: Alert and oriented x 4   Skin: Warm and dry, no jaundice.   Psych: Alert and cooperative, normal mood and affect.   Imaging Studies: No results found.

## 2019-07-09 NOTE — Assessment & Plan Note (Signed)
Chronic intermittent diarrhea in setting of prior cholecystectomy and IBS. Recent stools as outlined. Possible improvement on vancomycin but could have been due to dietary changes. I doubt he had Cdiff given negative GDH/Toxin test.   At this time will treat as IBS. Start Dicyclomine. Encouraged him to get labs done to screen for TSH and celiac.   Plan for colonoscopy to evaluate chronic diarrhea as well as surveillance for colon polyps. Plan for deep sedation given high dose conscious sedation medications with last procedures in 2013.  I have discussed the risks, alternatives, benefits with regards to but not limited to the risk of reaction to medication, bleeding, infection, perforation and the patient is agreeable to proceed. Written consent to be obtained.  Patient with chronic left flank pain without findings on CT renal protocol summer 2020 to explain. Offered CT A/P with contrast but pt wanted to postpone. Plan for TCS. If negative, consider CT at that time. He can call at any time to schedule CT especially if worsening pain, diarrhea.

## 2019-07-10 ENCOUNTER — Ambulatory Visit: Payer: BC Managed Care – PPO | Admitting: Orthopedic Surgery

## 2019-08-16 ENCOUNTER — Other Ambulatory Visit: Payer: Self-pay | Admitting: Gastroenterology

## 2019-08-16 ENCOUNTER — Telehealth: Payer: Self-pay | Admitting: Gastroenterology

## 2019-08-16 MED ORDER — DEXLANSOPRAZOLE 60 MG PO CPDR: 60.0000 mg | DELAYED_RELEASE_CAPSULE | Freq: Every day | ORAL | 5 refills | Status: AC

## 2019-08-16 NOTE — Telephone Encounter (Signed)
[place] PATIENT REGARDING HIS DEXILANT, [place] WAS PRESCRIBING IT AND HE IS NOT GONE,  HE THINKS WE USED TO PRESCRIBE IT TO HIM AND HE WILL PROBABLY WANT [place] TO SEND IN A PRESCRIPTION

## 2019-08-16 NOTE — Telephone Encounter (Signed)
Spoke with pt. Pt has gotten his Dexilant from our office and his PCP has been filling it. Pt is currently needing a Dexilant refill from our office since his PCP has retired. Pt will possible need a PA done on this medication and the pharmacy will only send our office the form if the RX comes from our office. Please refill medication per pts request.

## 2019-08-16 NOTE — Telephone Encounter (Signed)
Rx sent 

## 2019-08-19 NOTE — Telephone Encounter (Signed)
Rea, received a call from [facility]. Pt has always taken Dexilant 60 mg bid and they want to know if you want to change the RX to bid.

## 2019-08-19 NOTE — Telephone Encounter (Signed)
Noted  

## 2019-08-19 NOTE — Telephone Encounter (Signed)
Rx can be changed to BID. Do I need to send in a new Rx?

## 2019-08-19 NOTE — Telephone Encounter (Signed)
Spoke with Jonni Sanger at [facility]. They are changing the script and I will work on a PA for medication.

## 2019-08-20 NOTE — Telephone Encounter (Signed)
PA for Dexilant 60 mg was approved. Pt notified of approval. Pharmacy was notified of approval. Approval letter will be scanned in chart.

## 2019-09-04 IMAGING — MR MRI OF THE RIGHT SHOULDER WITHOUT CONTRAST
4 of 5 series · 19 of 40 positions shown · non-contrast
Comparison: None.

CLINICAL DATA: Right shoulder pain and decreased range of motion
and weakness since July 2018.

EXAM:
MRI OF THE RIGHT SHOULDER WITHOUT CONTRAST
TECHNIQUE: Multiplanar, multisequence MR imaging of the shoulder was performed.
No intravenous contrast was administered.

[Series 3: PD fat-sat · axial · 4.0mm · 0.31mm/px · z∈[+21,+94]mm · 8 of 17 slices shown (1 of 2)]
[im 1/17]
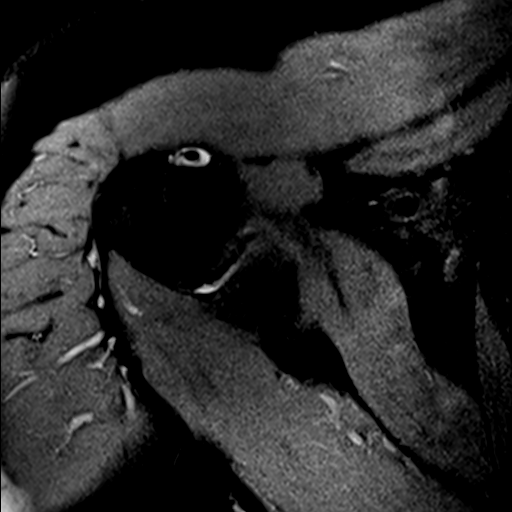
[im 3/17]
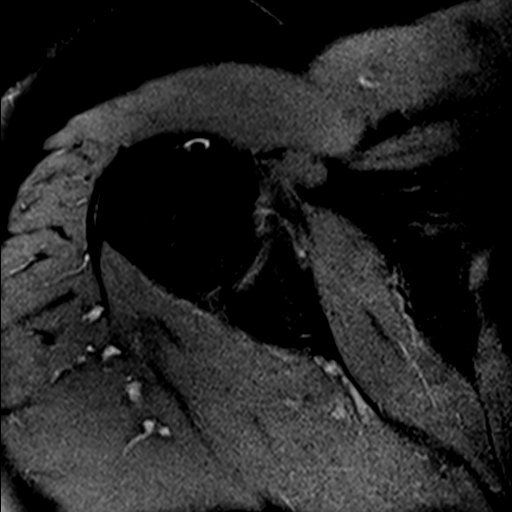
[im 5/17]
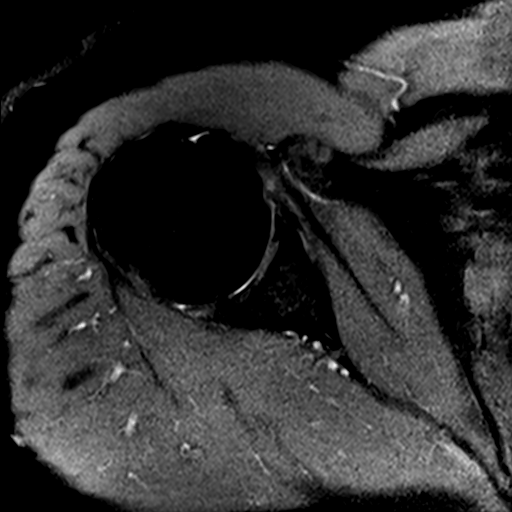
[im 7/17]
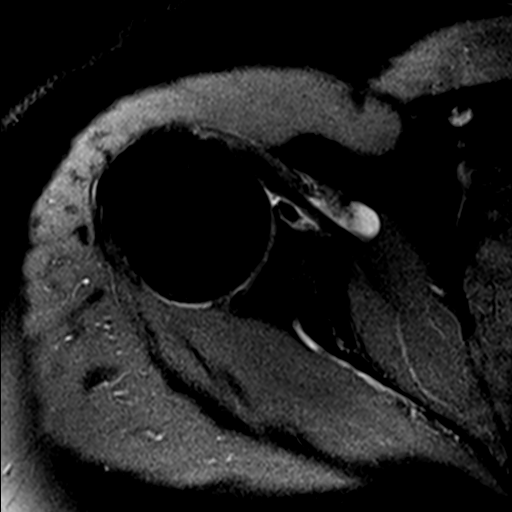
[im 10/17]
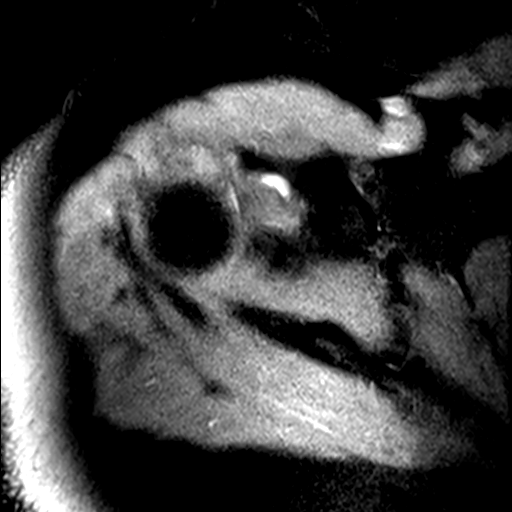
[im 12/17]
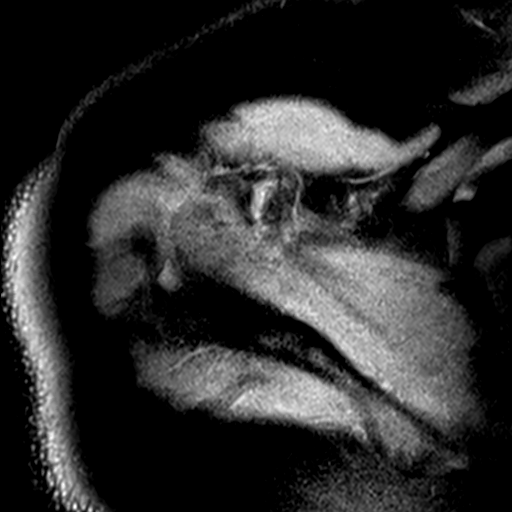
[im 14/17]
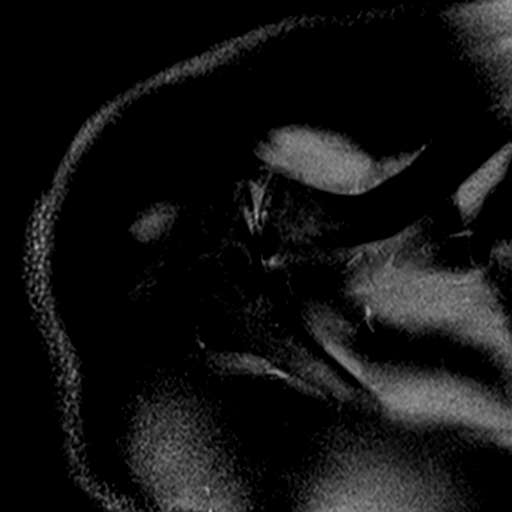
[im 17/17]
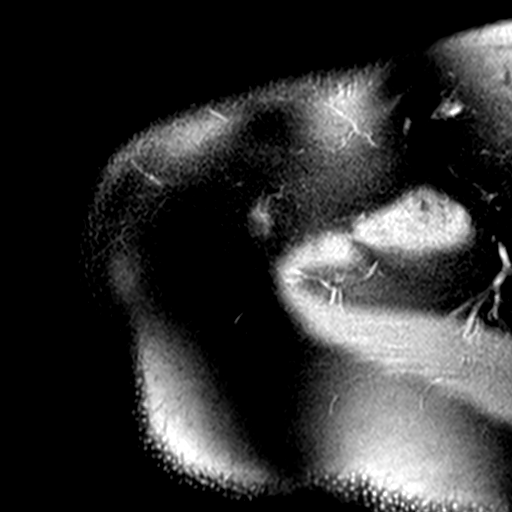

[Series 5: PD fat-sat · oblique · 4.0mm · 0.29mm/px · 5 of 15 slices shown (2 of 2)]
[im 1/15]
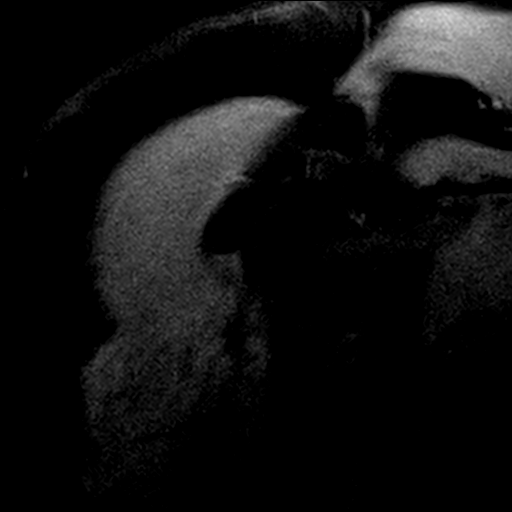
[im 3/15]
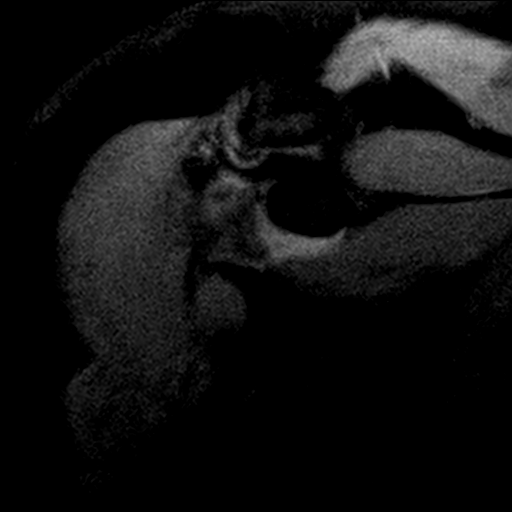
[im 5/15]
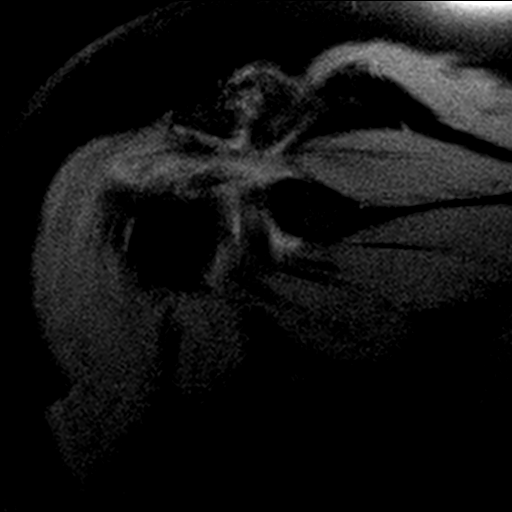
[im 8/15]
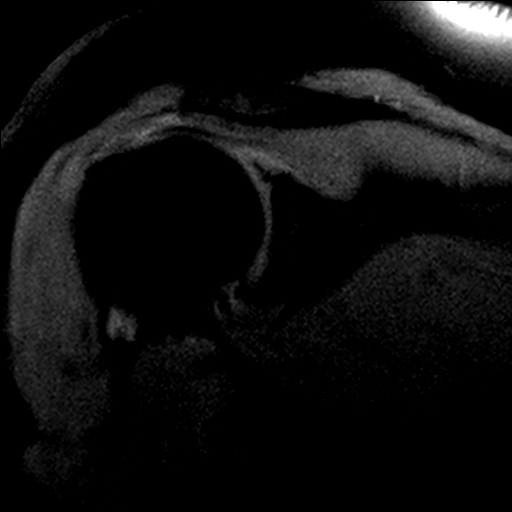
[im 12/15]
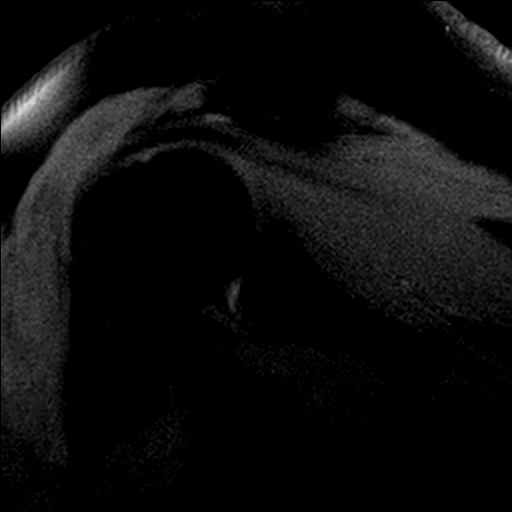

[Series 7: T2 fat-sat · coronal · 4.0mm · 0.29mm/px · 3 of 19 slices shown (1 of 2)]
[im 3/19]
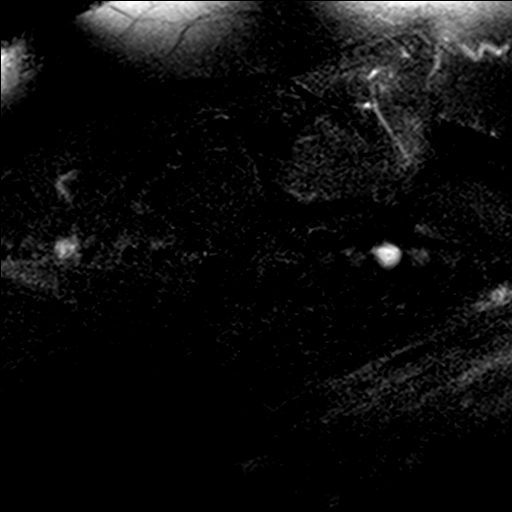
[im 10/19]
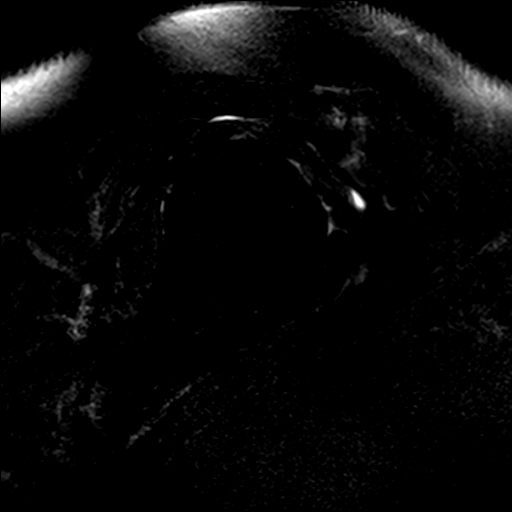
[im 16/19]
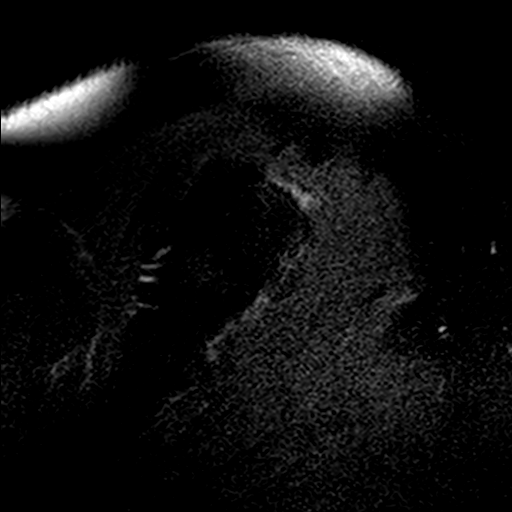

[Series 8: T2 fat-sat · oblique · 4.0mm · 0.29mm/px · 3 of 15 slices shown (2 of 2)]
[im 3/15]
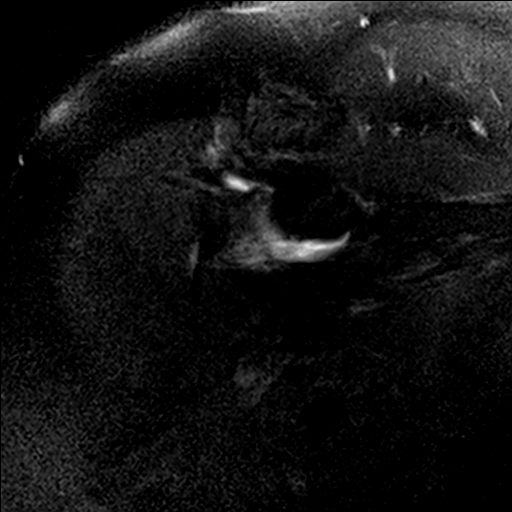
[im 8/15]
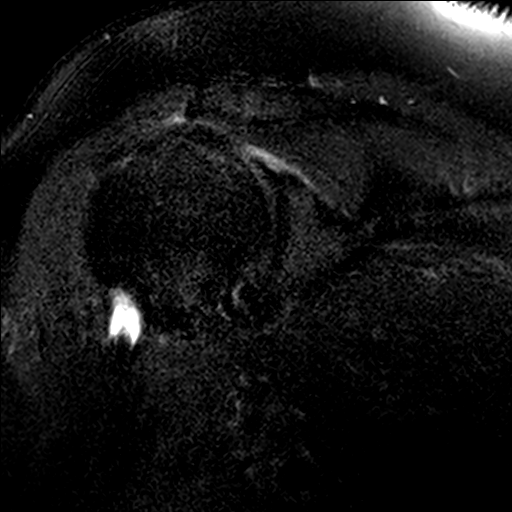
[im 12/15]
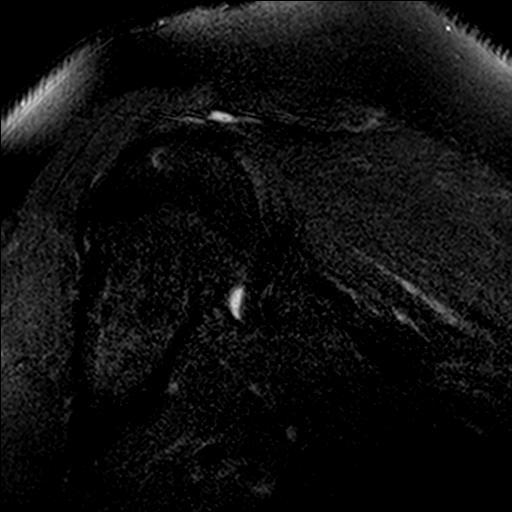

[19 of 40 positions shown; findings below may reference images not displayed]

FINDINGS: Rotator cuff: There are focal degenerative changes at the anterior
aspect of the distal supraspinatus tendon without a tear. The
remainder of the rotator cuff is normal.

Muscles: No atrophy or abnormal signal of the muscles of the rotator
cuff.

Biceps long head:  Properly located and intact.

Acromioclavicular Joint: Slight AC joint arthropathy. Type 2
acromion. No bursitis.

Glenohumeral Joint: No joint effusion. No chondral defect.

Labrum:  Intact.

Bones:  No significant abnormalities.

Other: None
IMPRESSION: 1. Slight degenerative changes of the anterior aspect of the distal
supraspinatus tendon without a discrete tear.
2. Slight AC joint arthropathy.
3. The study is somewhat compromised by motion artifact on multiple
sequences.

## 2019-10-08 ENCOUNTER — Other Ambulatory Visit (HOSPITAL_COMMUNITY)
Admission: RE | Admit: 2019-10-08 | Discharge: 2019-10-08 | Disposition: A | Discharge status: Home / Self Care | Payer: BC Managed Care – PPO | Source: Ambulatory Visit | Attending: Internal Medicine | Admitting: Internal Medicine

## 2019-10-08 ENCOUNTER — Telehealth: Payer: Self-pay | Admitting: Gastroenterology

## 2019-10-08 ENCOUNTER — Encounter (HOSPITAL_COMMUNITY)
Admission: RE | Admit: 2019-10-08 | Discharge: 2019-10-08 | Disposition: A | Discharge status: Home / Self Care | Payer: BC Managed Care – PPO | Source: Ambulatory Visit | Attending: Internal Medicine | Admitting: Internal Medicine

## 2019-10-08 ENCOUNTER — Other Ambulatory Visit: Payer: Self-pay

## 2019-10-08 DIAGNOSIS — Z01812 Encounter for preprocedural laboratory examination: Secondary | ICD-10-CM | POA: Insufficient documentation

## 2019-10-08 DIAGNOSIS — Z20822 Contact with and (suspected) exposure to covid-19: Secondary | ICD-10-CM | POA: Insufficient documentation

## 2019-10-08 MED ORDER — PEG 3350-KCL-NA BICARB-NACL 420 G PO SOLR: ORAL | 0 refills | Status: DC

## 2019-10-08 NOTE — Telephone Encounter (Signed)
Please resend patient prep to his pharmacy,he did not pick it up in December

## 2019-10-08 NOTE — Telephone Encounter (Signed)
Spoke with pt. Aware Rx resent. Aware to call back if pharmacy does not have this in stock

## 2019-10-08 NOTE — Telephone Encounter (Signed)
Patient called back. He stated the pharmacy does not have prep nor does any other pharmacy he checked with. He will come by office to pick up miralax prep instructions

## 2019-10-08 NOTE — Addendum Note (Signed)
Addended by: Cheron Every on: 10/08/2019 10:17 AM   Modules accepted: Orders

## 2019-10-09 LAB — SARS CORONAVIRUS 2 (TAT 6-24 HRS): SARS Coronavirus 2: NEGATIVE

## 2019-10-10 ENCOUNTER — Encounter (HOSPITAL_COMMUNITY)
Admission: RE | Disposition: A | Discharge status: Home / Self Care | Payer: Self-pay | Source: Home / Self Care | Attending: Internal Medicine

## 2019-10-10 ENCOUNTER — Ambulatory Visit (HOSPITAL_COMMUNITY): Payer: PRIVATE HEALTH INSURANCE | Admitting: Anesthesiology

## 2019-10-10 ENCOUNTER — Ambulatory Visit (HOSPITAL_COMMUNITY)
Admission: RE | Admit: 2019-10-10 | Discharge: 2019-10-10 | Disposition: A | Discharge status: Home / Self Care | Payer: PRIVATE HEALTH INSURANCE | Attending: Internal Medicine | Admitting: Internal Medicine

## 2019-10-10 DIAGNOSIS — I10 Essential (primary) hypertension: Secondary | ICD-10-CM | POA: Insufficient documentation

## 2019-10-10 DIAGNOSIS — K529 Noninfective gastroenteritis and colitis, unspecified: Secondary | ICD-10-CM | POA: Diagnosis not present

## 2019-10-10 DIAGNOSIS — Z87891 Personal history of nicotine dependence: Secondary | ICD-10-CM | POA: Diagnosis not present

## 2019-10-10 DIAGNOSIS — E669 Obesity, unspecified: Secondary | ICD-10-CM | POA: Diagnosis not present

## 2019-10-10 DIAGNOSIS — Z6837 Body mass index (BMI) 37.0-37.9, adult: Secondary | ICD-10-CM | POA: Diagnosis not present

## 2019-10-10 DIAGNOSIS — Z885 Allergy status to narcotic agent status: Secondary | ICD-10-CM | POA: Insufficient documentation

## 2019-10-10 DIAGNOSIS — Z9884 Bariatric surgery status: Secondary | ICD-10-CM | POA: Insufficient documentation

## 2019-10-10 DIAGNOSIS — Z86010 Personal history of colonic polyps: Secondary | ICD-10-CM | POA: Insufficient documentation

## 2019-10-10 DIAGNOSIS — R197 Diarrhea, unspecified: Secondary | ICD-10-CM

## 2019-10-10 DIAGNOSIS — Z79899 Other long term (current) drug therapy: Secondary | ICD-10-CM | POA: Insufficient documentation

## 2019-10-10 DIAGNOSIS — K640 First degree hemorrhoids: Secondary | ICD-10-CM | POA: Diagnosis not present

## 2019-10-10 DIAGNOSIS — K219 Gastro-esophageal reflux disease without esophagitis: Secondary | ICD-10-CM | POA: Insufficient documentation

## 2019-10-10 DIAGNOSIS — Z981 Arthrodesis status: Secondary | ICD-10-CM | POA: Diagnosis not present

## 2019-10-10 HISTORY — PX: BIOPSY: SHX5522

## 2019-10-10 HISTORY — PX: COLONOSCOPY WITH PROPOFOL: SHX5780

## 2019-10-10 SURGERY — COLONOSCOPY WITH PROPOFOL
Anesthesia: General

## 2019-10-10 MED ORDER — KETAMINE HCL 50 MG/5ML IJ SOSY
PREFILLED_SYRINGE | INTRAMUSCULAR | Status: AC
  Filled 2019-10-10: qty 5

## 2019-10-10 MED ORDER — GLYCOPYRROLATE 0.2 MG/ML IJ SOLN
INTRAMUSCULAR | Status: DC | PRN
  Administered 2019-10-10: .1 mg via INTRAVENOUS

## 2019-10-10 MED ORDER — KETAMINE HCL 10 MG/ML IJ SOLN
INTRAMUSCULAR | Status: DC | PRN
  Administered 2019-10-10: 40 mg via INTRAVENOUS

## 2019-10-10 MED ORDER — LACTATED RINGERS IV SOLN: Freq: Once | INTRAVENOUS | Status: DC

## 2019-10-10 MED ORDER — CHLORHEXIDINE GLUCONATE CLOTH 2 % EX PADS: 6.0000 | MEDICATED_PAD | Freq: Once | CUTANEOUS | Status: DC

## 2019-10-10 MED ORDER — PROPOFOL 500 MG/50ML IV EMUL
INTRAVENOUS | Status: DC | PRN
  Administered 2019-10-10: 150 ug/kg/min via INTRAVENOUS

## 2019-10-10 MED ORDER — LACTATED RINGERS IV SOLN: INTRAVENOUS | Status: DC | PRN

## 2019-10-10 MED ORDER — PROPOFOL 10 MG/ML IV BOLUS
INTRAVENOUS | Status: DC | PRN
  Administered 2019-10-10: 30 mg via INTRAVENOUS

## 2019-10-10 NOTE — Op Note (Signed)
[facility] Patient Name: Patrick Chapman Procedure Date: 10/10/2019 11:29 AM MRN: [id number] Date of Birth: August 14, 1960 Attending MD: Norvel Richards , MD CSN: [id number] Age: 59 Admit Type: Outpatient Procedure:                Colonoscopy Indications:              Chronic diarrhea Providers:                Norvel Richards, MD, Janeece Riggers, RN, Nelma Rothman, Technician Referring MD:              Medicines:                Propofol per Anesthesia Complications:            No immediate complications. Estimated Blood Loss:     Estimated blood loss was minimal. Procedure:                Pre-Anesthesia Assessment:                           - Prior to the procedure, a History and Physical                            was performed, and patient medications and                            allergies were reviewed. The patient's tolerance of                            previous anesthesia was also reviewed. The risks                            and benefits of the procedure and the sedation                            options and risks were discussed with the patient.                            All questions were answered, and informed consent                            was obtained. Prior Anticoagulants: The patient has                            taken no previous anticoagulant or antiplatelet                            agents. ASA Grade Assessment: II - A patient with                            mild systemic disease. After reviewing the risks  and benefits, the patient was deemed in                            satisfactory condition to undergo the procedure.                           After obtaining informed consent, the colonoscope                            was passed under direct vision. Throughout the                            procedure, the patient's blood pressure, pulse, and                            oxygen saturations were  monitored continuously. The                            [id number] scope was introduced through                            the anus and advanced to the 10 cm into the ileum.                            The colonoscopy was performed without difficulty.                            The patient tolerated the procedure well. The                            quality of the bowel preparation was adequate. Scope In: 11:41:57 AM Scope Out: 11:53:14 AM Scope Withdrawal Time: 0 hours 8 minutes 20 seconds  Total Procedure Duration: 0 hours 11 minutes 17 seconds  Findings:      The perianal and digital rectal examinations were normal.      Non-bleeding internal hemorrhoids were found during retroflexion. The       hemorrhoids were mild, small and Grade I (internal hemorrhoids that do       not prolapse).      The exam was otherwise without abnormality on direct and retroflexion       views. Distal 10 cm of terminal ileal mucosa appeared normal. Segmental       biopsies of the right and left colon taken. Impression:               - Non-bleeding internal hemorrhoids.                           - The examination was otherwise normal on direct                            and retroflexion views.                           -Status post segmental biopsy Moderate Sedation:      Moderate (conscious) sedation was personally administered by an  anesthesia professional. The following parameters were monitored: oxygen       saturation, heart rate, blood pressure, respiratory rate, EKG, adequacy       of pulmonary ventilation, and response to care. Recommendation:           - Patient has a contact number available for                            emergencies. The signs and symptoms of potential                            delayed complications were discussed with the                            patient. Return to normal activities tomorrow.                            Written discharge instructions were  provided to the                            patient.                           - Advance diet as tolerated.                           - Continue present medications. Further                            recommendations to follow pending review of                            pathology report.                           - Repeat colonoscopy in 5 years for surveillance.                           - Return to GI office in 3 months. Procedure Code(s):        --- Professional ---                           [phone], Colonoscopy, flexible; diagnostic, including                            collection of specimen(s) by brushing or washing,                            when performed (separate procedure) Diagnosis Code(s):        --- Professional ---                           K64.0, First degree hemorrhoids                           K52.9, Noninfective gastroenteritis and colitis,  unspecified CPT copyright 2019 American Medical Association. All rights reserved. The codes documented in this report are preliminary and upon coder review may  be revised to meet current compliance requirements. Cristopher Estimable. Sallyann Kinnaird, MD Norvel Richards, MD 10/10/2019 12:01:17 PM This report has been signed electronically. Number of Addenda: 0

## 2019-10-10 NOTE — Discharge Instructions (Signed)
  Colonoscopy Discharge Instructions  Read the instructions outlined below and refer to this sheet in the next few weeks. These discharge instructions provide you with general information on caring for yourself after you leave the hospital. Your doctor may also give you specific instructions. While your treatment has been planned according to the most current medical practices available, unavoidable complications occasionally occur. If you have any problems or questions after discharge, call Dr. Gala Romney at [phone]. ACTIVITY  You may resume your regular activity, but move at a slower pace for the next 24 hours.   Take frequent rest periods for the next 24 hours.   Walking will help get rid of the air and reduce the bloated feeling in your belly (abdomen).   No driving for 24 hours (because of the medicine (anesthesia) used during the test).    Do not sign any important legal documents or operate any machinery for 24 hours (because of the anesthesia used during the test).  NUTRITION  Drink plenty of fluids.   You may resume your normal diet as instructed by your doctor.   Begin with a light meal and progress to your normal diet. Heavy or fried foods are harder to digest and may make you feel sick to your stomach (nauseated).   Avoid alcoholic beverages for 24 hours or as instructed.  MEDICATIONS  You may resume your normal medications unless your doctor tells you otherwise.  WHAT YOU CAN EXPECT TODAY  Some feelings of bloating in the abdomen.   Passage of more gas than usual.   Spotting of blood in your stool or on the toilet paper.  IF YOU HAD POLYPS REMOVED DURING THE COLONOSCOPY:  No aspirin products for 7 days or as instructed.   No alcohol for 7 days or as instructed.   Eat a soft diet for the next 24 hours.  FINDING OUT THE RESULTS OF YOUR TEST Not all test results are available during your visit. If your test results are not back during the visit, make an appointment  with your caregiver to find out the results. Do not assume everything is normal if you have not heard from your caregiver or the medical facility. It is important for you to follow up on all of your test results.  SEEK IMMEDIATE MEDICAL ATTENTION IF:  You have more than a spotting of blood in your stool.   Your belly is swollen (abdominal distention).   You are nauseated or vomiting.   You have a temperature over 101.   You have abdominal pain or discomfort that is severe or gets worse throughout the day.    No colon polyps found today.  Biopsy of your colon done to check for other causes of diarrhea although you most likely have irritable bowel syndrome to account for it.  Further recommendations to follow pending review of pathology report  Recommend repeat colonoscopy in 5 years  Office visit with [place] in 3 months  At patient request, I called Jahaun Sullo at [phone] and reviewed results

## 2019-10-10 NOTE — Anesthesia Postprocedure Evaluation (Signed)
Anesthesia Post Note  Patient: NEYLAN LANDFAIR  Procedure(s) Performed: COLONOSCOPY WITH PROPOFOL (N/A ) BIOPSY  Patient location during evaluation: PACU Anesthesia Type: General Level of consciousness: awake and alert Pain management: pain level controlled Vital Signs Assessment: post-procedure vital signs reviewed and stable Respiratory status: spontaneous breathing and nonlabored ventilation Cardiovascular status: stable Postop Assessment: no apparent nausea or vomiting Anesthetic complications: no     Last Vitals:  Vitals:   10/10/19 1045  BP: 114/69  Pulse: 73  Resp: 18  Temp: 36.6 C  SpO2: 99%    Last Pain:  Vitals:   10/10/19 1045  TempSrc: Oral  PainSc: 0-No pain                 Jackalynn Art Hristova

## 2019-10-10 NOTE — Anesthesia Preprocedure Evaluation (Signed)
Anesthesia Evaluation  Patient identified by MRN, date of birth, ID band Patient awake    Reviewed: Allergy & Precautions, NPO status , Patient's Chart, lab work & pertinent test results, reviewed documented beta blocker date and time   Airway Mallampati: II  TM Distance: >3 FB Neck ROM: Full   Comment: ACDF Dental  (+) Teeth Intact, Caps   Pulmonary sleep apnea , former smoker,    Pulmonary exam normal breath sounds clear to auscultation       Cardiovascular Exercise Tolerance: Good hypertension, Pt. on home beta blockers and Pt. on medications Normal cardiovascular exam Rhythm:Regular Rate:Normal     Neuro/Psych negative psych ROS   GI/Hepatic Neg liver ROS, Bowel prep,GERD  Medicated and Controlled,  Endo/Other  negative endocrine ROS  Renal/GU negative Renal ROS  negative genitourinary   Musculoskeletal ACDF   Abdominal   Peds  Hematology negative hematology ROS (+)   Anesthesia Other Findings   Reproductive/Obstetrics negative OB ROS                            Anesthesia Physical Anesthesia Plan  ASA: II  Anesthesia Plan: General   Post-op Pain Management:    Induction: Intravenous  PONV Risk Score and Plan: TIVA  Airway Management Planned: Nasal Cannula, Natural Airway and Simple Face Mask  Additional Equipment:   Intra-op Plan:   Post-operative Plan:   Informed Consent: I have reviewed the patients History and Physical, chart, labs and discussed the procedure including the risks, benefits and alternatives for the proposed anesthesia with the patient or authorized representative who has indicated his/her understanding and acceptance.     Dental advisory given  Plan Discussed with: CRNA  Anesthesia Plan Comments:         Anesthesia Quick Evaluation

## 2019-10-10 NOTE — Transfer of Care (Signed)
Immediate Anesthesia Transfer of Care Note  Patient: Patrick Chapman  Procedure(s) Performed: COLONOSCOPY WITH PROPOFOL (N/A ) BIOPSY  Patient Location: PACU  Anesthesia Type:General  Level of Consciousness: awake  Airway & Oxygen Therapy: Patient Spontanous Breathing  Post-op Assessment: Report given to RN and Post -op Vital signs reviewed and stable  Post vital signs: Reviewed and stable  Last Vitals:  Vitals Value Taken Time  BP    Temp    Pulse 75 10/10/19 1159  Resp 20 10/10/19 1159  SpO2 99 % 10/10/19 1159  Vitals shown include unvalidated device data.  Last Pain:  Vitals:   10/10/19 1045  TempSrc: Oral  PainSc: 0-No pain      Patients Stated Pain Goal: 6 ([id number])  Complications: No apparent anesthesia complications

## 2019-10-10 NOTE — H&P (Signed)
@LOGO @   Primary Care Physician:  Celene Squibb, MD Primary Gastroenterologist:  Dr. Gala Romney  Pre-Procedure History & Physical: HPI:  Patrick Chapman is a 59 y.o. male here for surveillance colonoscopy.  History of colonic adenoma.  Also worsening of chronic diarrhea recently.  States dicyclomine helps quite a bit.  Past Medical History:  Diagnosis Date  . GERD (gastroesophageal reflux disease)   . Hypertension   . Obesity   . Sleep apnea    was tested, but had lap band surgery and with weight loss, no problems    Past Surgical History:  Procedure Laterality Date  . ANTERIOR CERVICAL DECOMP/DISCECTOMY FUSION N/A 08/15/2014   Procedure: ANTERIOR CERVICAL DECOMPRESSION/DISCECTOMY FUSION 1 LEVEL;  Surgeon: Consuella Lose, MD;  Location: [place] [facility];  Service: Neurosurgery;  Laterality: N/A;  C56 anterior cervical fusion with interbody prosthesis plating and bonegraft C 5-6  . CHOLECYSTECTOMY    . COLONOSCOPY WITH ESOPHAGOGASTRODUODENOSCOPY (EGD)  06/12/2012   Dr. Gala Romney: Status post prior lap band placement, otherwise normal study.  2 tubular adenomas removed from the colon.  Next colonoscopy in 5 years.  Marland Kitchen LAPAROSCOPIC GASTRIC BANDING  2009   Dr. Hassell Done    Prior to Admission medications   Medication Sig Start Date End Date Taking? Authorizing Provider  ascorbic acid ([employer] VITAMIN C) 500 MG tablet Take 500 mg by mouth daily.   Yes [provider]  AZOR 5-20 MG tablet TAKE ONE (1) TABLET BY MOUTH EVERY DAY Patient taking differently: Take 1 tablet by mouth daily.  03/20/17  Yes BranchAlphonse Guild, MD  Cholecalciferol (VITAMIN D-3) 125 MCG (5000 UT) TABS Take 5,000 Units by mouth daily.   Yes [provider]  dexlansoprazole (DEXILANT) 60 MG capsule Take 1 capsule (60 mg total) by mouth daily. Patient taking differently: Take 60 mg by mouth See admin instructions. Take 1 capsule (60 mg) by mouth scheduled every morning, may take an additional capsule in the evening if  needed for acid reflux/indigestion. 08/16/19  Yes Erenest Rasher, PA-C  dicyclomine (BENTYL) 10 MG capsule Take 1 capsule (10 mg total) by mouth 4 (four) times daily -  before meals and at bedtime. As needed for diarrhea Patient taking differently: Take 10 mg by mouth in the morning and at bedtime.  07/09/19  Yes Mahala Menghini, PA-C  loratadine (CLARITIN) 10 MG tablet Take 10 mg by mouth daily.   Yes [provider]  metoprolol tartrate (LOPRESSOR) 50 MG tablet Take 1 tablet (50 mg total) by mouth 2 (two) times daily. 01/23/18  Yes BranchAlphonse Guild, MD  tamsulosin (FLOMAX) 0.4 MG CAPS capsule Take 0.4 mg by mouth at bedtime.    Yes [provider]  Zinc 50 MG TABS Take 50 mg by mouth daily.   Yes [provider]  acetaminophen (TYLENOL) 500 MG tablet Take 1,000 mg by mouth every 6 (six) hours as needed for moderate pain or headache.    [provider]  polyethylene glycol-electrolytes (NULYTELY) 420 g solution As directed 10/08/19   Eleri Ruben, Cristopher Estimable, MD    Allergies as of 07/09/2019 - Review Complete 07/09/2019  Allergen Reaction Noted  . Codeine Nausea And Vomiting and Rash 10/16/2009    Family History  Problem Relation Age of Onset  . Heart attack Mother        living  . Heart attack Father        living  . Diabetes Father   . Colon cancer Neg Hx  Social History   Socioeconomic History  . Marital status: Married    Spouse name: Not on file  . Number of children: Not on file  . Years of education: Not on file  . Highest education level: Not on file  Occupational History  . Not on file  Tobacco Use  . Smoking status: Former Smoker    Packs/day: 1.00    Years: 10.00    Pack years: 10.00    Types: Cigarettes    Start date: 05/23/1977    Quit date: 05/24/1987    Years since quitting: 32.4  . Smokeless tobacco: Current User    Types: Snuff  Substance and Sexual Activity  . Alcohol use: Not Currently    Alcohol/week: 0.0 standard  drinks    Comment: occassional  . Drug use: No  . Sexual activity: Yes    Partners: Female    Birth control/protection: Surgical    Comment: spouse  Other Topics Concern  . Not on file  Social History Narrative  . Not on file   Social Determinants of Health   Financial Resource Strain:   . Difficulty of Paying Living Expenses: Not on file  Food Insecurity:   . Worried About Charity fundraiser in the Last Year: Not on file  . Ran Out of Food in the Last Year: Not on file  Transportation Needs:   . Lack of Transportation (Medical): Not on file  . Lack of Transportation (Non-Medical): Not on file  Physical Activity:   . Days of Exercise per Week: Not on file  . Minutes of Exercise per Session: Not on file  Stress:   . Feeling of Stress : Not on file  Social Connections:   . Frequency of Communication with Friends and Family: Not on file  . Frequency of Social Gatherings with Friends and Family: Not on file  . Attends Religious Services: Not on file  . Active Member of Clubs or Organizations: Not on file  . Attends Archivist Meetings: Not on file  . Marital Status: Not on file  Intimate Partner Violence:   . Fear of Current or Ex-Partner: Not on file  . Emotionally Abused: Not on file  . Physically Abused: Not on file  . Sexually Abused: Not on file    Review of Systems: See HPI, otherwise negative ROS  Physical Exam: BP 114/69   Pulse 73   Temp 97.8 F (36.6 C) (Oral)   Resp 18   Ht 6' (1.829 m)   Wt 124.3 kg   SpO2 99%   BMI 37.16 kg/m  General:   Alert,  Well-developed, well-nourished, pleasant and cooperative in NAD SNeck:  Supple; no masses or thyromegaly. No significant cervical adenopathy. Lungs:  Clear throughout to auscultation.   No wheezes, crackles, or rhonchi. No acute distress. Heart:  Regular rate and rhythm; no murmurs, clicks, rubs,  or gallops. Abdomen: Non-distended, normal bowel sounds.  Soft and nontender without appreciable mass  or hepatosplenomegaly.  Pulses:  Normal pulses noted. Extremities:  Without clubbing or edema.  Impression/Plan: 59 year old gentleman with history colonic polyps.  History of chronic diarrhea.  Here for colonoscopy per plan. The risks, benefits, limitations, alternatives and imponderables have been reviewed with the patient. Potential for esophageal dilation, biopsy, etc. have also been reviewed.  Questions have been answered. All parties agreeable.     Notice: This dictation was prepared with Dragon dictation along with smaller phrase technology. Any transcriptional errors that result from this process are  unintentional and may not be corrected upon review.

## 2019-10-11 ENCOUNTER — Encounter: Payer: Self-pay | Admitting: Internal Medicine

## 2019-10-11 LAB — SURGICAL PATHOLOGY

## 2019-11-19 ENCOUNTER — Telehealth (INDEPENDENT_AMBULATORY_CARE_PROVIDER_SITE_OTHER): Payer: BC Managed Care – PPO | Admitting: Cardiology

## 2019-11-19 ENCOUNTER — Encounter: Payer: Self-pay | Admitting: Cardiology

## 2019-11-19 VITALS — Ht 72.0 in | Wt 272.0 lb

## 2019-11-19 DIAGNOSIS — I10 Essential (primary) hypertension: Secondary | ICD-10-CM

## 2019-11-19 DIAGNOSIS — R002 Palpitations: Secondary | ICD-10-CM

## 2019-11-19 NOTE — Patient Instructions (Signed)
Medication Instructions:  Your physician recommends that you continue on your current medications as directed. Please refer to the Current Medication list given to you today.  *If you need a refill on your cardiac medications before your next appointment, please call your pharmacy*   Lab Work: NONE  If you have labs (blood work) drawn today and your tests are completely normal, you will receive your results only by: Marland Kitchen MyChart Message (if you have MyChart) OR . A paper copy in the mail If you have any lab test that is abnormal or we need to change your treatment, we will call you to review the results.   Testing/Procedures: NONE    Follow-Up: At [facility], you and your health needs are our priority.  As part of our continuing mission to provide you with exceptional heart care, we have created designated Provider Care Teams.  These Care Teams include your primary Cardiologist (physician) and Advanced Practice Providers (APPs -  Physician Assistants and Nurse Practitioners) who all work together to provide you with the care you need, when you need it.  We recommend signing up for the patient portal called "MyChart".  Sign up information is provided on this After Visit Summary.  MyChart is used to connect with patients for Virtual Visits (Telemedicine).  Patients are able to view lab/test results, encounter notes, upcoming appointments, etc.  Non-urgent messages can be sent to your provider as well.   To learn more about what you can do with MyChart, go to [email or web address].    Your next appointment:   1 year(s)  The format for your next appointment:   In Person  Provider:   Dr. Harl Bowie   Other Instructions Thank you for choosing [facility]!

## 2019-11-19 NOTE — Progress Notes (Signed)
Virtual Visit via Telephone Note   This visit type was conducted due to national recommendations for restrictions regarding the COVID-19 Pandemic (e.g. social distancing) in an effort to limit this patient's exposure and mitigate transmission in our community.  Due to his co-morbid illnesses, this patient is at least at moderate risk for complications without adequate follow up.  This format is felt to be most appropriate for this patient at this time.  The patient did not have access to video technology/had technical difficulties with video requiring transitioning to audio format only (telephone).  All issues noted in this document were discussed and addressed.  No physical exam could be performed with this format.  Please refer to the patient's chart for his  consent to telehealth for [facility].   The patient was identified using 2 identifiers.  Date:  11/19/2019   ID:  Patrick Chapman, DOB 1960/09/07, MRN [id number]  Patient Location: Home Provider Location: Office  PCP:  Patrick Squibb, MD  Cardiologist:  Dr Carlyle Dolly MD Electrophysiologist:  None   Evaluation Performed:  Follow-Up Visit  Chief Complaint:  Follow up  History of Present Illness:    Patrick Chapman is a 59 y.o. male seen today for follow up of the following medical problems.   1. Palpitations - 06/2015 holter monitor with primarily NSR, occasional isolated PVCs.  - since last visit he has cut back on alcohol, since that time palpitations significantly improved   - no significant symptoms since visit.    2. HTN - home bp's 120s/70s - compliant with meds  3. Chronic diarrhea - followed by GI   The patient does not have symptoms concerning for COVID-19 infection (fever, chills, cough, or new shortness of breath).    Past Medical History:  Diagnosis Date  . GERD (gastroesophageal reflux disease)   . Hypertension   . Obesity   . Sleep apnea    was tested, but had lap band surgery  and with weight loss, no problems   Past Surgical History:  Procedure Laterality Date  . ANTERIOR CERVICAL DECOMP/DISCECTOMY FUSION N/A 08/15/2014   Procedure: ANTERIOR CERVICAL DECOMPRESSION/DISCECTOMY FUSION 1 LEVEL;  Surgeon: Patrick Lose, MD;  Location: [place] [facility];  Service: Neurosurgery;  Laterality: N/A;  C56 anterior cervical fusion with interbody prosthesis plating and bonegraft C 5-6  . BIOPSY  10/10/2019   Procedure: BIOPSY;  Surgeon: Patrick Dolin, MD;  [facility];  Service: Endoscopy;;  . CHOLECYSTECTOMY    . COLONOSCOPY WITH ESOPHAGOGASTRODUODENOSCOPY (EGD)  06/12/2012   Dr. Gala Chapman: Status post prior lap band placement, otherwise normal study.  2 tubular adenomas removed from the colon.  Next colonoscopy in 5 years.  . COLONOSCOPY WITH PROPOFOL N/A 10/10/2019   Procedure: COLONOSCOPY WITH PROPOFOL;  Surgeon: Patrick Dolin, MD;  [facility];  Service: Endoscopy;  Laterality: N/A;  11:00am  . LAPAROSCOPIC GASTRIC BANDING  2009   Dr. Hassell Done     Current Meds  Medication Sig  . acetaminophen (TYLENOL) 500 MG tablet Take 1,000 mg by mouth every 6 (six) hours as needed for moderate pain or headache.  . AZOR 5-20 MG tablet TAKE ONE (1) TABLET BY MOUTH EVERY DAY (Patient taking differently: Take 1 tablet by mouth daily. )  . Cholecalciferol (VITAMIN D-3) 125 MCG (5000 UT) TABS Take 5,000 Units by mouth daily.  Patrick Chapman dexlansoprazole (DEXILANT) 60 MG capsule Take 1 capsule (60 mg total) by mouth daily. (Patient taking  differently: Take 60 mg by mouth See admin instructions. Take 1 capsule (60 mg) by mouth scheduled every morning, may take an additional capsule in the evening if needed for acid reflux/indigestion.)  . dicyclomine (BENTYL) 10 MG capsule Take 1 capsule (10 mg total) by mouth 4 (four) times daily -  before meals and at bedtime. As needed for diarrhea (Patient taking differently: Take 10 mg by mouth in the morning and at bedtime. )  . loratadine  (CLARITIN) 10 MG tablet Take 10 mg by mouth daily.  . metoprolol tartrate (LOPRESSOR) 50 MG tablet Take 1 tablet (50 mg total) by mouth 2 (two) times daily.  . tamsulosin (FLOMAX) 0.4 MG CAPS capsule Take 0.4 mg by mouth in the morning and at bedtime.      Allergies:   Codeine   Social History   Tobacco Use  . Smoking status: Former Smoker    Packs/day: 1.00    Years: 10.00    Pack years: 10.00    Types: Cigarettes    Start date: 05/23/1977    Quit date: 05/24/1987    Years since quitting: 32.5  . Smokeless tobacco: Current User    Types: Snuff  Substance Use Topics  . Alcohol use: Not Currently    Alcohol/week: 0.0 standard drinks    Comment: occassional  . Drug use: No     Family Hx: The patient's family history includes Diabetes in his father; Heart attack in his father and mother. There is no history of Colon cancer.  ROS:   Please see the history of present illness.     All other systems reviewed and are negative.   Prior CV studies:   The following studies were reviewed today:    Labs/Other Tests and Data Reviewed:    EKG:  No ECG reviewed.  Recent Labs: 02/11/2019: ALT 28 03/18/2019: BUN 23; Creatinine, Ser 1.88; Hemoglobin 13.7; Platelets 214; Potassium 5.0; Sodium 140   Recent Lipid Panel Lab Results  Component Value Date/Time   CHOL 207 (H) 04/26/2018 10:13 AM   TRIG 180 (H) 04/26/2018 10:13 AM   HDL 32 (L) 04/26/2018 10:13 AM   CHOLHDL 6.5 04/26/2018 10:13 AM   LDLCALC 139 (H) 04/26/2018 10:13 AM    Wt Readings from Last 3 Encounters:  11/19/19 272 lb (123.4 kg)  10/10/19 274 lb (124.3 kg)  07/09/19 300 lb (136.1 kg)     Objective:    Vital Signs:  Ht 6' (1.829 m)   Wt 272 lb (123.4 kg)   BMI 36.89 kg/m    Normal affect. Norma speech pattern and tone Comfortable, no apparent distress. No audible signs of sob or wheezing.   ASSESSMENT & PLAN:   1. Palpitations - holter without significant arrhythmias. - no recent symptoms, doing well  on lopressor - continue current meds   2. HTN - bp at goal, continue current meds    F/u 1 year   COVID-19 Education: The signs and symptoms of COVID-19 were discussed with the patient and how to seek care for testing (follow up with PCP or arrange E-visit).  The importance of social distancing was discussed today.  Time:   Today, I have spent 20 minutes with the patient with telehealth technology discussing the above problems.     Medication Adjustments/Labs and Tests Ordered: Current medicines are reviewed at length with the patient today.  Concerns regarding medicines are outlined above.   Tests Ordered: No orders of the defined types were placed in this encounter.  Medication Changes: No orders of the defined types were placed in this encounter.   Follow Up:  Either In Person or Virtual in 1 year(s)  Signed, Carlyle Dolly, MD  11/19/2019 8:48 AM     [facility]

## 2019-11-22 ENCOUNTER — Other Ambulatory Visit: Payer: Self-pay | Admitting: Cardiology

## 2020-01-28 ENCOUNTER — Encounter: Payer: Self-pay | Admitting: Gastroenterology

## 2020-01-28 ENCOUNTER — Ambulatory Visit: Payer: BC Managed Care – PPO | Admitting: Gastroenterology

## 2020-02-10 ENCOUNTER — Other Ambulatory Visit: Payer: Self-pay

## 2020-02-10 ENCOUNTER — Encounter: Payer: Self-pay | Admitting: Emergency Medicine

## 2020-02-10 ENCOUNTER — Ambulatory Visit
Admission: EM | Admit: 2020-02-10 | Discharge: 2020-02-10 | Disposition: A | Discharge status: Home / Self Care | Payer: BC Managed Care – PPO | Attending: Emergency Medicine | Admitting: Emergency Medicine

## 2020-02-10 DIAGNOSIS — M79674 Pain in right toe(s): Secondary | ICD-10-CM

## 2020-02-10 DIAGNOSIS — M109 Gout, unspecified: Secondary | ICD-10-CM

## 2020-02-10 MED ORDER — DEXAMETHASONE SODIUM PHOSPHATE 10 MG/ML IJ SOLN
10.0000 mg | Freq: Once | INTRAMUSCULAR | Status: AC
  Administered 2020-02-10: 10 mg via INTRAMUSCULAR

## 2020-02-10 MED ORDER — PREDNISONE 20 MG PO TABS: 20.0000 mg | ORAL_TABLET | Freq: Two times a day (BID) | ORAL | 0 refills | Status: AC

## 2020-02-10 NOTE — Discharge Instructions (Signed)
Steroid shot given Prescribed prednisone prescribed.  Take as directed and to completions Follow up with PCP as needed Return or go to the ED if you have any new or worsening symptoms fever, chills, nausea, vomiting, redness, swelling, worsening symptoms despite treatment, etc..Marland Kitchen

## 2020-02-10 NOTE — ED Triage Notes (Signed)
RT great toe pain x 2 days, red and painful to touch

## 2020-02-10 NOTE — ED Provider Notes (Signed)
[name]   [id number] 02/10/20 Arrival Time: 4765  CC: RT toe pain  SUBJECTIVE: History from: patient. Patrick Chapman is a 59 y.o. male complains of RT great toe pain x 2 days.  Denies a precipitating event or specific injury.  Localizes the pain to the RT toe pain.  Describes the pain as constant, dull, throbbing, sharp and achy in character.  Has tried OTC medications without relief.  Symptoms are made worse to the touch.  Denies similar symptoms in the past.  Complains of associated swelling, redness, and warm to the touch.  Denies chills, ecchymosis, weakness, numbness and tingling.  ROS: As per HPI.  All other pertinent ROS negative.     Past Medical History:  Diagnosis Date   GERD (gastroesophageal reflux disease)    Hypertension    Obesity    Sleep apnea    was tested, but had lap band surgery and with weight loss, no problems   Past Surgical History:  Procedure Laterality Date   ANTERIOR CERVICAL DECOMP/DISCECTOMY FUSION N/A 08/15/2014   Procedure: ANTERIOR CERVICAL DECOMPRESSION/DISCECTOMY FUSION 1 LEVEL;  Surgeon: Consuella Lose, MD;  Location: [place] [facility];  Service: Neurosurgery;  Laterality: N/A;  C56 anterior cervical fusion with interbody prosthesis plating and bonegraft C 5-6   BIOPSY  10/10/2019   Procedure: BIOPSY;  Surgeon: Daneil Dolin, MD;  [facility];  Service: Endoscopy;;   CHOLECYSTECTOMY     COLONOSCOPY WITH ESOPHAGOGASTRODUODENOSCOPY (EGD)  06/12/2012   Dr. Gala Romney: Status post prior lap band placement, otherwise normal study.  2 tubular adenomas removed from the colon.  Next colonoscopy in 5 years.   COLONOSCOPY WITH PROPOFOL N/A 10/10/2019   Procedure: COLONOSCOPY WITH PROPOFOL;  Surgeon: Daneil Dolin, MD;  [facility];  Service: Endoscopy;  Laterality: N/A;  11:00am   LAPAROSCOPIC GASTRIC BANDING  2009   Dr. Hassell Done   Allergies  Allergen Reactions   Codeine Nausea And Vomiting and Rash   No current  facility-administered medications on file prior to encounter.   Current Outpatient Medications on File Prior to Encounter  Medication Sig Dispense Refill   acetaminophen (TYLENOL) 500 MG tablet Take 1,000 mg by mouth every 6 (six) hours as needed for moderate pain or headache.     AZOR 5-20 MG tablet TAKE ONE (1) TABLET BY MOUTH EVERY DAY (Patient taking differently: Take 1 tablet by mouth daily. ) 15 tablet 0   Cholecalciferol (VITAMIN D-3) 125 MCG (5000 UT) TABS Take 5,000 Units by mouth daily.     dexlansoprazole (DEXILANT) 60 MG capsule Take 1 capsule (60 mg total) by mouth daily. (Patient taking differently: Take 60 mg by mouth See admin instructions. Take 1 capsule (60 mg) by mouth scheduled every morning, may take an additional capsule in the evening if needed for acid reflux/indigestion.) 30 capsule 5   dicyclomine (BENTYL) 10 MG capsule Take 1 capsule (10 mg total) by mouth 4 (four) times daily -  before meals and at bedtime. As needed for diarrhea (Patient taking differently: Take 10 mg by mouth in the morning and at bedtime. ) 120 capsule 3   loratadine (CLARITIN) 10 MG tablet Take 10 mg by mouth daily.     metoprolol tartrate (LOPRESSOR) 50 MG tablet TAKE ONE TABLET BY MOUTH TWICE A DAY 180 tablet 1   tamsulosin (FLOMAX) 0.4 MG CAPS capsule Take 0.4 mg by mouth in the morning and at bedtime.      Social History   Socioeconomic  History   Marital status: Married    Spouse name: Not on file   Number of children: Not on file   Years of education: Not on file   Highest education level: Not on file  Occupational History   Not on file  Tobacco Use   Smoking status: Former Smoker    Packs/day: 1.00    Years: 10.00    Pack years: 10.00    Types: Cigarettes    Start date: 05/23/1977    Quit date: 05/24/1987    Years since quitting: 32.7   Smokeless tobacco: Current User    Types: Snuff  Vaping Use   Vaping Use: Never used  Substance and Sexual Activity    Alcohol use: Not Currently    Alcohol/week: 0.0 standard drinks    Comment: occassional   Drug use: No   Sexual activity: Yes    Partners: Female    Birth control/protection: Surgical    Comment: spouse  Other Topics Concern   Not on file  Social History Narrative   Not on file   Social Determinants of Health   Financial Resource Strain:    Difficulty of Paying Living Expenses:   Food Insecurity:    Worried About Charity fundraiser in the Last Year:    Arboriculturist in the Last Year:   Transportation Needs:    Film/video editor (Medical):    Lack of Transportation (Non-Medical):   Physical Activity:    Days of Exercise per Week:    Minutes of Exercise per Session:   Stress:    Feeling of Stress :   Social Connections:    Frequency of Communication with Friends and Family:    Frequency of Social Gatherings with Friends and Family:    Attends Religious Services:    Active Member of Clubs or Organizations:    Attends Music therapist:    Marital Status:   Intimate Partner Violence:    Fear of Current or Ex-Partner:    Emotionally Abused:    Physically Abused:    Sexually Abused:    Family History  Problem Relation Age of Onset   Heart attack Mother        living   Heart attack Father        living   Diabetes Father    Colon cancer Neg Hx     OBJECTIVE:  Vitals:   02/10/20 1737  BP: 123/82  Pulse: 67  Resp: 17  Temp: 98.1 F (36.7 C)  TempSrc: Oral  SpO2: 97%    General appearance: ALERT; in no acute distress.  Head: NCAT Lungs: Normal respiratory effort CV: Dorsalis pedis pulse 2+. Cap refill < 2 seconds Musculoskeletal: RT great toe Inspection: erythema and swelling over medial 1st MTP Palpation: TTP over medial 1st MTP joint Skin: warm and dry Neurologic: Ambulates without difficulty; Sensation intact about the lower extremities Psychological: alert and cooperative; normal mood and  affect   ASSESSMENT & PLAN:  1. Great toe pain, right   2. Podagra   3. Acute gout involving toe of right foot, unspecified cause     Meds ordered this encounter  Medications   predniSONE (DELTASONE) 20 MG tablet    Sig: Take 1 tablet (20 mg total) by mouth 2 (two) times daily with a meal for 5 days.    Dispense:  10 tablet    Refill:  0    Order Specific Question:   Supervising Provider  AnswerRaylene Everts [[id number]]   dexamethasone (DECADRON) injection 10 mg   Steroid shot given Prescribed prednisone prescribed.  Take as directed and to completions Follow up with PCP as needed Return or go to the ED if you have any new or worsening symptoms fever, chills, nausea, vomiting, redness, swelling, worsening symptoms despite treatment, etc...  Reviewed expectations re: course of current medical issues. Questions answered. Outlined signs and symptoms indicating need for more acute intervention. Patient verbalized understanding. After Visit Summary given.    Lestine Box, PA-C 02/10/20 1805

## 2020-03-24 ENCOUNTER — Telehealth: Payer: Self-pay | Admitting: Adult Health

## 2020-03-24 NOTE — Telephone Encounter (Signed)
Called and LMOM regarding monoclonal antibody treatment for COVID 19 given to those who are at risk for complications and/or hospitalization of the virus.  Patient meets criteria based on: bmi greater than 25, HTN  Call back number given: [phone]  My chart message: sent  Wilber Bihari, NP

## 2020-03-25 ENCOUNTER — Telehealth: Payer: Self-pay | Admitting: Oncology

## 2020-03-25 ENCOUNTER — Telehealth: Payer: Self-pay | Admitting: Hospice and Palliative Medicine

## 2020-03-25 NOTE — Telephone Encounter (Signed)
I called to discuss with patient about Covid-19 symptoms and the use of regeneron, a monoclonal antibody infusion for those with mild to moderate Covid-19 symptoms and at a high risk of hospitalization.     Pt is qualified for this infusion at the [facility] due to co-morbid conditions and/or a member of an at-risk group.     I spoke with patient's wife. She says he is feeling much better today and wanted to hold off on MAB infusion for now. I encouraged her to call back if he changed his mind.   Altha Harm, PhD, NP-C [phone] ([place])

## 2020-03-25 NOTE — Telephone Encounter (Signed)
Called to Discuss with patient about Covid symptoms and the use of regeneron, a monoclonal antibody infusion for those with mild to moderate Covid symptoms and at a high risk of hospitalization.     Pt is qualified for this infusion at the [facility] infusion center due to co-morbid conditions and/or a member of an at-risk group.     Unable to reach pt. Left message to return call  Jenny Ever Gustafson, AGNP-C [phone] (Infusion Center Hotline)  

## 2020-03-31 ENCOUNTER — Other Ambulatory Visit: Payer: Self-pay | Admitting: Gastroenterology

## 2020-07-13 ENCOUNTER — Other Ambulatory Visit: Payer: Self-pay

## 2020-07-13 DIAGNOSIS — K567 Ileus, unspecified: Secondary | ICD-10-CM | POA: Diagnosis not present

## 2020-07-13 DIAGNOSIS — K9501 Infection due to gastric band procedure: Secondary | ICD-10-CM | POA: Diagnosis present

## 2020-07-13 DIAGNOSIS — I272 Pulmonary hypertension, unspecified: Secondary | ICD-10-CM | POA: Diagnosis present

## 2020-07-13 DIAGNOSIS — I5041 Acute combined systolic (congestive) and diastolic (congestive) heart failure: Secondary | ICD-10-CM | POA: Diagnosis not present

## 2020-07-13 DIAGNOSIS — Z9049 Acquired absence of other specified parts of digestive tract: Secondary | ICD-10-CM

## 2020-07-13 DIAGNOSIS — F17290 Nicotine dependence, other tobacco product, uncomplicated: Secondary | ICD-10-CM | POA: Diagnosis present

## 2020-07-13 DIAGNOSIS — J9811 Atelectasis: Secondary | ICD-10-CM | POA: Diagnosis not present

## 2020-07-13 DIAGNOSIS — I4892 Unspecified atrial flutter: Secondary | ICD-10-CM | POA: Diagnosis present

## 2020-07-13 DIAGNOSIS — A419 Sepsis, unspecified organism: Principal | ICD-10-CM | POA: Diagnosis present

## 2020-07-13 DIAGNOSIS — Z6839 Body mass index (BMI) 39.0-39.9, adult: Secondary | ICD-10-CM

## 2020-07-13 DIAGNOSIS — Z9884 Bariatric surgery status: Secondary | ICD-10-CM

## 2020-07-13 DIAGNOSIS — E860 Dehydration: Secondary | ICD-10-CM | POA: Diagnosis present

## 2020-07-13 DIAGNOSIS — K859 Acute pancreatitis without necrosis or infection, unspecified: Secondary | ICD-10-CM | POA: Diagnosis present

## 2020-07-13 DIAGNOSIS — Z885 Allergy status to narcotic agent status: Secondary | ICD-10-CM

## 2020-07-13 DIAGNOSIS — Z981 Arthrodesis status: Secondary | ICD-10-CM

## 2020-07-13 DIAGNOSIS — I110 Hypertensive heart disease with heart failure: Secondary | ICD-10-CM | POA: Diagnosis present

## 2020-07-13 DIAGNOSIS — K529 Noninfective gastroenteritis and colitis, unspecified: Secondary | ICD-10-CM | POA: Diagnosis present

## 2020-07-13 DIAGNOSIS — R946 Abnormal results of thyroid function studies: Secondary | ICD-10-CM | POA: Diagnosis not present

## 2020-07-13 DIAGNOSIS — Z87442 Personal history of urinary calculi: Secondary | ICD-10-CM

## 2020-07-13 DIAGNOSIS — N179 Acute kidney failure, unspecified: Secondary | ICD-10-CM | POA: Diagnosis not present

## 2020-07-13 DIAGNOSIS — R6521 Severe sepsis with septic shock: Secondary | ICD-10-CM | POA: Diagnosis not present

## 2020-07-13 DIAGNOSIS — K659 Peritonitis, unspecified: Secondary | ICD-10-CM | POA: Diagnosis present

## 2020-07-13 DIAGNOSIS — J95821 Acute postprocedural respiratory failure: Secondary | ICD-10-CM | POA: Diagnosis not present

## 2020-07-13 DIAGNOSIS — Z20822 Contact with and (suspected) exposure to covid-19: Secondary | ICD-10-CM | POA: Diagnosis present

## 2020-07-13 DIAGNOSIS — G4730 Sleep apnea, unspecified: Secondary | ICD-10-CM | POA: Diagnosis present

## 2020-07-13 DIAGNOSIS — Z86010 Personal history of colonic polyps: Secondary | ICD-10-CM

## 2020-07-13 DIAGNOSIS — K219 Gastro-esophageal reflux disease without esophagitis: Secondary | ICD-10-CM | POA: Diagnosis present

## 2020-07-13 DIAGNOSIS — K668 Other specified disorders of peritoneum: Secondary | ICD-10-CM | POA: Diagnosis not present

## 2020-07-13 DIAGNOSIS — Z8249 Family history of ischemic heart disease and other diseases of the circulatory system: Secondary | ICD-10-CM

## 2020-07-13 DIAGNOSIS — Z833 Family history of diabetes mellitus: Secondary | ICD-10-CM

## 2020-07-13 DIAGNOSIS — I319 Disease of pericardium, unspecified: Secondary | ICD-10-CM | POA: Diagnosis present

## 2020-07-13 DIAGNOSIS — E872 Acidosis: Secondary | ICD-10-CM | POA: Diagnosis present

## 2020-07-13 DIAGNOSIS — E876 Hypokalemia: Secondary | ICD-10-CM | POA: Diagnosis not present

## 2020-07-13 DIAGNOSIS — D696 Thrombocytopenia, unspecified: Secondary | ICD-10-CM | POA: Diagnosis not present

## 2020-07-13 DIAGNOSIS — D649 Anemia, unspecified: Secondary | ICD-10-CM | POA: Diagnosis present

## 2020-07-14 ENCOUNTER — Inpatient Hospital Stay (HOSPITAL_COMMUNITY): Payer: PRIVATE HEALTH INSURANCE

## 2020-07-14 ENCOUNTER — Encounter (HOSPITAL_COMMUNITY): Payer: Self-pay | Admitting: Emergency Medicine

## 2020-07-14 ENCOUNTER — Inpatient Hospital Stay (HOSPITAL_COMMUNITY): Payer: PRIVATE HEALTH INSURANCE | Admitting: Anesthesiology

## 2020-07-14 ENCOUNTER — Inpatient Hospital Stay (HOSPITAL_COMMUNITY)
Admission: EM | Admit: 2020-07-14 | Discharge: 2020-07-24 | DRG: 853 | Disposition: A | Discharge status: Home Health | Payer: PRIVATE HEALTH INSURANCE | Attending: General Surgery | Admitting: General Surgery

## 2020-07-14 ENCOUNTER — Encounter (HOSPITAL_COMMUNITY): Admission: EM | Disposition: A | Discharge status: Home Health | Payer: Self-pay | Source: Home / Self Care

## 2020-07-14 ENCOUNTER — Emergency Department (HOSPITAL_COMMUNITY): Payer: PRIVATE HEALTH INSURANCE

## 2020-07-14 DIAGNOSIS — J95821 Acute postprocedural respiratory failure: Secondary | ICD-10-CM | POA: Diagnosis not present

## 2020-07-14 DIAGNOSIS — K668 Other specified disorders of peritoneum: Secondary | ICD-10-CM

## 2020-07-14 DIAGNOSIS — Z9884 Bariatric surgery status: Secondary | ICD-10-CM | POA: Diagnosis not present

## 2020-07-14 DIAGNOSIS — A419 Sepsis, unspecified organism: Secondary | ICD-10-CM | POA: Diagnosis present

## 2020-07-14 DIAGNOSIS — I10 Essential (primary) hypertension: Secondary | ICD-10-CM | POA: Diagnosis not present

## 2020-07-14 DIAGNOSIS — I110 Hypertensive heart disease with heart failure: Secondary | ICD-10-CM | POA: Diagnosis present

## 2020-07-14 DIAGNOSIS — Z87442 Personal history of urinary calculi: Secondary | ICD-10-CM | POA: Diagnosis not present

## 2020-07-14 DIAGNOSIS — I428 Other cardiomyopathies: Secondary | ICD-10-CM | POA: Diagnosis not present

## 2020-07-14 DIAGNOSIS — I5041 Acute combined systolic (congestive) and diastolic (congestive) heart failure: Secondary | ICD-10-CM | POA: Diagnosis not present

## 2020-07-14 DIAGNOSIS — K567 Ileus, unspecified: Secondary | ICD-10-CM | POA: Diagnosis not present

## 2020-07-14 DIAGNOSIS — R6521 Severe sepsis with septic shock: Secondary | ICD-10-CM | POA: Diagnosis not present

## 2020-07-14 DIAGNOSIS — K659 Peritonitis, unspecified: Secondary | ICD-10-CM | POA: Diagnosis present

## 2020-07-14 DIAGNOSIS — J9690 Respiratory failure, unspecified, unspecified whether with hypoxia or hypercapnia: Secondary | ICD-10-CM

## 2020-07-14 DIAGNOSIS — I484 Atypical atrial flutter: Secondary | ICD-10-CM

## 2020-07-14 DIAGNOSIS — K9501 Infection due to gastric band procedure: Secondary | ICD-10-CM | POA: Diagnosis present

## 2020-07-14 DIAGNOSIS — R109 Unspecified abdominal pain: Secondary | ICD-10-CM | POA: Diagnosis not present

## 2020-07-14 DIAGNOSIS — E872 Acidosis: Secondary | ICD-10-CM | POA: Diagnosis present

## 2020-07-14 DIAGNOSIS — R000 Tachycardia, unspecified: Secondary | ICD-10-CM | POA: Diagnosis not present

## 2020-07-14 DIAGNOSIS — K219 Gastro-esophageal reflux disease without esophagitis: Secondary | ICD-10-CM | POA: Diagnosis present

## 2020-07-14 DIAGNOSIS — G4730 Sleep apnea, unspecified: Secondary | ICD-10-CM | POA: Diagnosis present

## 2020-07-14 DIAGNOSIS — Z20822 Contact with and (suspected) exposure to covid-19: Secondary | ICD-10-CM | POA: Diagnosis present

## 2020-07-14 DIAGNOSIS — Z9049 Acquired absence of other specified parts of digestive tract: Secondary | ICD-10-CM | POA: Diagnosis not present

## 2020-07-14 DIAGNOSIS — N179 Acute kidney failure, unspecified: Secondary | ICD-10-CM | POA: Diagnosis not present

## 2020-07-14 DIAGNOSIS — Z01818 Encounter for other preprocedural examination: Secondary | ICD-10-CM

## 2020-07-14 DIAGNOSIS — I4892 Unspecified atrial flutter: Secondary | ICD-10-CM | POA: Diagnosis present

## 2020-07-14 DIAGNOSIS — Z9889 Other specified postprocedural states: Secondary | ICD-10-CM

## 2020-07-14 DIAGNOSIS — Z8249 Family history of ischemic heart disease and other diseases of the circulatory system: Secondary | ICD-10-CM | POA: Diagnosis not present

## 2020-07-14 DIAGNOSIS — I483 Typical atrial flutter: Secondary | ICD-10-CM | POA: Diagnosis not present

## 2020-07-14 DIAGNOSIS — J9811 Atelectasis: Secondary | ICD-10-CM | POA: Diagnosis not present

## 2020-07-14 DIAGNOSIS — Z86010 Personal history of colonic polyps: Secondary | ICD-10-CM | POA: Diagnosis not present

## 2020-07-14 DIAGNOSIS — K859 Acute pancreatitis without necrosis or infection, unspecified: Secondary | ICD-10-CM | POA: Diagnosis present

## 2020-07-14 DIAGNOSIS — I319 Disease of pericardium, unspecified: Secondary | ICD-10-CM | POA: Diagnosis present

## 2020-07-14 DIAGNOSIS — Z833 Family history of diabetes mellitus: Secondary | ICD-10-CM | POA: Diagnosis not present

## 2020-07-14 DIAGNOSIS — Z981 Arthrodesis status: Secondary | ICD-10-CM | POA: Diagnosis not present

## 2020-07-14 HISTORY — PX: LAPAROTOMY: SHX154

## 2020-07-14 HISTORY — PX: APPLICATION OF WOUND VAC: SHX5189

## 2020-07-14 LAB — RESP PANEL BY RT-PCR (FLU A&B, COVID) ARPGX2
Influenza A by PCR: NEGATIVE
Influenza B by PCR: NEGATIVE
SARS Coronavirus 2 by RT PCR: NEGATIVE

## 2020-07-14 LAB — POCT I-STAT 7, (LYTES, BLD GAS, ICA,H+H)
Acid-base deficit: 5 mmol/L — ABNORMAL HIGH (ref 0.0–2.0)
Bicarbonate: 20.8 mmol/L (ref 20.0–28.0)
Calcium, Ion: 1.23 mmol/L (ref 1.15–1.40)
HCT: 45 % (ref 39.0–52.0)
Hemoglobin: 15.3 g/dL (ref 13.0–17.0)
O2 Saturation: 100 %
Patient temperature: 96.8
Potassium: 4.8 mmol/L (ref 3.5–5.1)
Sodium: 140 mmol/L (ref 135–145)
TCO2: 22 mmol/L (ref 22–32)
pCO2 arterial: 40.3 mmHg (ref 32.0–48.0)
pH, Arterial: 7.315 — ABNORMAL LOW (ref 7.350–7.450)
pO2, Arterial: 238 mmHg — ABNORMAL HIGH (ref 83.0–108.0)

## 2020-07-14 LAB — CBC
HCT: 50.3 % (ref 39.0–52.0)
HCT: 45.6 % (ref 39.0–52.0)
Hemoglobin: 15.8 g/dL (ref 13.0–17.0)
Hemoglobin: 14.7 g/dL (ref 13.0–17.0)
MCH: 31.1 pg (ref 26.0–34.0)
MCH: 30.4 pg (ref 26.0–34.0)
MCHC: 31.4 g/dL (ref 30.0–36.0)
MCHC: 32.2 g/dL (ref 30.0–36.0)
MCV: 99 fL (ref 80.0–100.0)
MCV: 94.4 fL (ref 80.0–100.0)
Platelets: 229 10*3/uL (ref 150–400)
Platelets: 223 10*3/uL (ref 150–400)
RBC: 5.08 MIL/uL (ref 4.22–5.81)
RBC: 4.83 MIL/uL (ref 4.22–5.81)
RDW: 13.7 % (ref 11.5–15.5)
RDW: 14 % (ref 11.5–15.5)
WBC: 13.8 10*3/uL — ABNORMAL HIGH (ref 4.0–10.5)
WBC: 6.4 10*3/uL (ref 4.0–10.5)
nRBC: 0 % (ref 0.0–0.2)
nRBC: 0 % (ref 0.0–0.2)

## 2020-07-14 LAB — COMPREHENSIVE METABOLIC PANEL
ALT: 22 U/L (ref 0–44)
AST: 24 U/L (ref 15–41)
Albumin: 4.5 g/dL (ref 3.5–5.0)
Alkaline Phosphatase: 53 U/L (ref 38–126)
Anion gap: 10 (ref 5–15)
BUN: 17 mg/dL (ref 6–20)
CO2: 25 mmol/L (ref 22–32)
Calcium: 9.1 mg/dL (ref 8.9–10.3)
Chloride: 104 mmol/L (ref 98–111)
Creatinine, Ser: 1.24 mg/dL (ref 0.61–1.24)
GFR, Estimated: >60 mL/min (ref >60)
Glucose, Bld: 237 mg/dL — ABNORMAL HIGH (ref 70–99)
Potassium: 3.3 mmol/L — ABNORMAL LOW (ref 3.5–5.1)
Sodium: 139 mmol/L (ref 135–145)
Total Bilirubin: 0.5 mg/dL (ref 0.3–1.2)
Total Protein: 8.1 g/dL (ref 6.5–8.1)

## 2020-07-14 LAB — LACTIC ACID, PLASMA
Lactic Acid, Venous: 4.2 mmol/L (ref 0.5–1.9)
Lactic Acid, Venous: 3.5 mmol/L (ref 0.5–1.9)

## 2020-07-14 LAB — HEMOGLOBIN A1C
Hgb A1c MFr Bld: 5.7 % — ABNORMAL HIGH (ref 4.8–5.6)
Mean Plasma Glucose: 116.89 mg/dL

## 2020-07-14 LAB — TROPONIN I (HIGH SENSITIVITY)
Troponin I (High Sensitivity): 2 ng/L (ref <18)
Troponin I (High Sensitivity): 5 ng/L (ref <18)

## 2020-07-14 LAB — PREPARE RBC (CROSSMATCH)

## 2020-07-14 LAB — LIPASE, BLOOD: Lipase: 1068 U/L — ABNORMAL HIGH (ref 11–51)

## 2020-07-14 LAB — ABO/RH: ABO/RH(D): O POS

## 2020-07-14 LAB — GLUCOSE, CAPILLARY
Glucose-Capillary: 120 mg/dL — ABNORMAL HIGH (ref 70–99)
Glucose-Capillary: 124 mg/dL — ABNORMAL HIGH (ref 70–99)
Glucose-Capillary: 119 mg/dL — ABNORMAL HIGH (ref 70–99)

## 2020-07-14 LAB — MRSA PCR SCREENING: MRSA by PCR: NEGATIVE

## 2020-07-14 LAB — HEMOGLOBIN AND HEMATOCRIT, BLOOD
HCT: 43.1 % (ref 39.0–52.0)
Hemoglobin: 14.7 g/dL (ref 13.0–17.0)

## 2020-07-14 LAB — MAGNESIUM: Magnesium: 1.4 mg/dL — ABNORMAL LOW (ref 1.7–2.4)

## 2020-07-14 LAB — LACTATE DEHYDROGENASE: LDH: 180 U/L (ref 98–192)

## 2020-07-14 SURGERY — LAPAROTOMY, EXPLORATORY
Anesthesia: General | Site: Abdomen

## 2020-07-14 MED ORDER — PIPERACILLIN-TAZOBACTAM 3.375 G IVPB 30 MIN: 3.3750 g | Freq: Three times a day (TID) | INTRAVENOUS | Status: DC

## 2020-07-14 MED ORDER — FENTANYL BOLUS VIA INFUSION
50.0000 ug | INTRAVENOUS | Status: DC | PRN
  Filled 2020-07-14: qty 50

## 2020-07-14 MED ORDER — CEFAZOLIN SODIUM-DEXTROSE 2-3 GM-%(50ML) IV SOLR
INTRAVENOUS | Status: DC | PRN
  Administered 2020-07-14: 2 g via INTRAVENOUS

## 2020-07-14 MED ORDER — ONDANSETRON HCL 4 MG/2ML IJ SOLN
4.0000 mg | Freq: Four times a day (QID) | INTRAMUSCULAR | Status: DC | PRN
  Administered 2020-07-22 (×2): 4 mg via INTRAVENOUS
  Filled 2020-07-14 (×3): qty 2

## 2020-07-14 MED ORDER — CALCIUM CHLORIDE 10 % IV SOLN
INTRAVENOUS | Status: DC | PRN
  Administered 2020-07-14 (×2): 200 mg via INTRAVENOUS

## 2020-07-14 MED ORDER — ROCURONIUM BROMIDE 100 MG/10ML IV SOLN
INTRAVENOUS | Status: DC | PRN
  Administered 2020-07-14: 60 mg via INTRAVENOUS
  Administered 2020-07-14: 30 mg via INTRAVENOUS
  Administered 2020-07-14: 40 mg via INTRAVENOUS

## 2020-07-14 MED ORDER — PHENYLEPHRINE HCL-NACL 10-0.9 MG/250ML-% IV SOLN
0.0000 ug/min | INTRAVENOUS | Status: DC
  Administered 2020-07-14: 20 ug/min via INTRAVENOUS
  Administered 2020-07-15: 60 ug/min via INTRAVENOUS
  Administered 2020-07-15: 100 ug/min via INTRAVENOUS
  Administered 2020-07-15 (×3): 80 ug/min via INTRAVENOUS
  Administered 2020-07-15: 30 ug/min via INTRAVENOUS
  Administered 2020-07-16 (×2): 70 ug/min via INTRAVENOUS
  Filled 2020-07-14 (×8): qty 250
  Filled 2020-07-14: qty 500
  Filled 2020-07-14: qty 250

## 2020-07-14 MED ORDER — LACTATED RINGERS IV BOLUS
1000.0000 mL | Freq: Once | INTRAVENOUS | Status: AC
  Administered 2020-07-14: 1000 mL via INTRAVENOUS

## 2020-07-14 MED ORDER — SODIUM CHLORIDE 0.9 % IV SOLN: INTRAVENOUS | Status: DC | PRN

## 2020-07-14 MED ORDER — CHLORHEXIDINE GLUCONATE CLOTH 2 % EX PADS
6.0000 | MEDICATED_PAD | Freq: Every day | CUTANEOUS | Status: DC
  Administered 2020-07-14 – 2020-07-24 (×10): 6 via TOPICAL

## 2020-07-14 MED ORDER — PIPERACILLIN-TAZOBACTAM 3.375 G IVPB 30 MIN
3.3750 g | Freq: Once | INTRAVENOUS | Status: AC
  Administered 2020-07-14: 3.375 g via INTRAVENOUS
  Filled 2020-07-14: qty 50

## 2020-07-14 MED ORDER — DOCUSATE SODIUM 50 MG/5ML PO LIQD: 100.0000 mg | Freq: Two times a day (BID) | ORAL | Status: DC

## 2020-07-14 MED ORDER — LACTATED RINGERS IV SOLN: INTRAVENOUS | Status: DC | PRN

## 2020-07-14 MED ORDER — FENTANYL 2500MCG IN NS 250ML (10MCG/ML) PREMIX INFUSION
50.0000 ug/h | INTRAVENOUS | Status: DC
  Administered 2020-07-14: 50 ug/h via INTRAVENOUS
  Filled 2020-07-14 (×3): qty 250

## 2020-07-14 MED ORDER — STERILE WATER FOR IRRIGATION IR SOLN
Status: DC | PRN
  Administered 2020-07-14: 1000 mL

## 2020-07-14 MED ORDER — CHLORHEXIDINE GLUCONATE 0.12% ORAL RINSE (MEDLINE KIT)
15.0000 mL | Freq: Two times a day (BID) | OROMUCOSAL | Status: DC
  Administered 2020-07-14 – 2020-07-16 (×5): 15 mL via OROMUCOSAL

## 2020-07-14 MED ORDER — HYDROMORPHONE HCL 1 MG/ML IJ SOLN
1.0000 mg | Freq: Once | INTRAMUSCULAR | Status: AC
  Administered 2020-07-14: 1 mg via INTRAVENOUS
  Filled 2020-07-14: qty 1

## 2020-07-14 MED ORDER — PROPOFOL 1000 MG/100ML IV EMUL
0.0000 ug/kg/min | INTRAVENOUS | Status: DC
  Administered 2020-07-14: 45 ug/kg/min via INTRAVENOUS
  Administered 2020-07-14: 50 ug/kg/min via INTRAVENOUS
  Administered 2020-07-14: 40 ug/kg/min via INTRAVENOUS
  Administered 2020-07-14: 20 ug/kg/min via INTRAVENOUS
  Administered 2020-07-15: 15 ug/kg/min via INTRAVENOUS
  Filled 2020-07-14 (×5): qty 100

## 2020-07-14 MED ORDER — CEFAZOLIN SODIUM-DEXTROSE 2-4 GM/100ML-% IV SOLN: 2.0000 g | Freq: Once | INTRAVENOUS | Status: DC

## 2020-07-14 MED ORDER — PHENYLEPHRINE HCL-NACL 10-0.9 MG/250ML-% IV SOLN
0.0000 ug/min | INTRAVENOUS | Status: DC
Start: 2020-07-15 — End: 2020-07-14

## 2020-07-14 MED ORDER — ORAL CARE MOUTH RINSE
15.0000 mL | OROMUCOSAL | Status: DC
  Administered 2020-07-14 – 2020-07-16 (×22): 15 mL via OROMUCOSAL

## 2020-07-14 MED ORDER — PIPERACILLIN-TAZOBACTAM 3.375 G IVPB
3.3750 g | Freq: Three times a day (TID) | INTRAVENOUS | Status: AC
  Administered 2020-07-14 – 2020-07-19 (×17): 3.375 g via INTRAVENOUS
  Filled 2020-07-14 (×16): qty 50

## 2020-07-14 MED ORDER — FENTANYL CITRATE (PF) 100 MCG/2ML IJ SOLN
50.0000 ug | INTRAMUSCULAR | Status: DC | PRN
  Administered 2020-07-14: 50 ug via INTRAVENOUS
  Administered 2020-07-14: 100 ug via INTRAVENOUS
  Filled 2020-07-14 (×2): qty 2

## 2020-07-14 MED ORDER — POTASSIUM CHLORIDE 10 MEQ/50ML IV SOLN
10.0000 meq | INTRAVENOUS | Status: AC
  Administered 2020-07-14 (×4): 10 meq via INTRAVENOUS
  Filled 2020-07-14 (×4): qty 50

## 2020-07-14 MED ORDER — SODIUM CHLORIDE 0.9% IV SOLUTION: Freq: Once | INTRAVENOUS | Status: DC

## 2020-07-14 MED ORDER — PROPOFOL 10 MG/ML IV BOLUS
INTRAVENOUS | Status: DC | PRN
  Administered 2020-07-14: 200 mg via INTRAVENOUS

## 2020-07-14 MED ORDER — LACTATED RINGERS IV SOLN: INTRAVENOUS | Status: DC

## 2020-07-14 MED ORDER — PANTOPRAZOLE SODIUM 40 MG IV SOLR
40.0000 mg | Freq: Two times a day (BID) | INTRAVENOUS | Status: DC
  Administered 2020-07-14 – 2020-07-23 (×20): 40 mg via INTRAVENOUS
  Filled 2020-07-14 (×20): qty 40

## 2020-07-14 MED ORDER — FENTANYL CITRATE (PF) 100 MCG/2ML IJ SOLN: 50.0000 ug | Freq: Once | INTRAMUSCULAR | Status: DC

## 2020-07-14 MED ORDER — LIDOCAINE HCL (CARDIAC) PF 100 MG/5ML IV SOSY
PREFILLED_SYRINGE | INTRAVENOUS | Status: DC | PRN
  Administered 2020-07-14: 60 mg via INTRATRACHEAL

## 2020-07-14 MED ORDER — POLYETHYLENE GLYCOL 3350 17 G PO PACK
17.0000 g | PACK | Freq: Every day | ORAL | Status: DC
  Administered 2020-07-15: 17 g
  Filled 2020-07-14 (×2): qty 1

## 2020-07-14 MED ORDER — POLYETHYLENE GLYCOL 3350 17 G PO PACK: 17.0000 g | PACK | Freq: Every day | ORAL | Status: DC

## 2020-07-14 MED ORDER — 0.9 % SODIUM CHLORIDE (POUR BTL) OPTIME
TOPICAL | Status: DC | PRN
  Administered 2020-07-14 (×2): 1000 mL

## 2020-07-14 MED ORDER — IOHEXOL 300 MG/ML  SOLN
100.0000 mL | Freq: Once | INTRAMUSCULAR | Status: AC | PRN
  Administered 2020-07-14: 100 mL via INTRAVENOUS

## 2020-07-14 MED ORDER — SODIUM CHLORIDE 0.9 % IV SOLN
INTRAVENOUS | Status: DC | PRN
  Administered 2020-07-14: 500 mL via INTRAVENOUS

## 2020-07-14 MED ORDER — SODIUM CHLORIDE 0.9 % IV SOLN: INTRAVENOUS | Status: DC

## 2020-07-14 MED ORDER — PHENYLEPHRINE HCL-NACL 10-0.9 MG/250ML-% IV SOLN
INTRAVENOUS | Status: DC | PRN
  Administered 2020-07-14: 40 ug/min via INTRAVENOUS

## 2020-07-14 MED ORDER — MIDAZOLAM HCL 5 MG/5ML IJ SOLN
INTRAMUSCULAR | Status: DC | PRN
  Administered 2020-07-14: 2 mg via INTRAVENOUS

## 2020-07-14 MED ORDER — FENTANYL CITRATE (PF) 250 MCG/5ML IJ SOLN
INTRAMUSCULAR | Status: DC | PRN
Start: 2020-07-14 — End: 2020-07-14
  Administered 2020-07-14 (×5): 50 ug via INTRAVENOUS

## 2020-07-14 MED ORDER — FENTANYL CITRATE (PF) 100 MCG/2ML IJ SOLN: 50.0000 ug | INTRAMUSCULAR | Status: DC | PRN

## 2020-07-14 MED ORDER — ONDANSETRON HCL 4 MG/2ML IJ SOLN
4.0000 mg | Freq: Once | INTRAMUSCULAR | Status: AC
  Administered 2020-07-14: 4 mg via INTRAVENOUS
  Filled 2020-07-14: qty 2

## 2020-07-14 MED ORDER — INSULIN ASPART 100 UNIT/ML ~~LOC~~ SOLN
0.0000 [IU] | SUBCUTANEOUS | Status: DC
  Administered 2020-07-14 – 2020-07-23 (×6): 2 [IU] via SUBCUTANEOUS

## 2020-07-14 MED ORDER — DOCUSATE SODIUM 50 MG/5ML PO LIQD
100.0000 mg | Freq: Two times a day (BID) | ORAL | Status: DC
  Administered 2020-07-14 – 2020-07-15 (×3): 100 mg
  Filled 2020-07-14 (×4): qty 10

## 2020-07-14 MED ORDER — ACETAMINOPHEN 10 MG/ML IV SOLN
1000.0000 mg | Freq: Four times a day (QID) | INTRAVENOUS | Status: AC
  Administered 2020-07-14 (×4): 1000 mg via INTRAVENOUS
  Filled 2020-07-14 (×8): qty 100

## 2020-07-14 MED ORDER — ALBUMIN HUMAN 5 % IV SOLN: INTRAVENOUS | Status: DC | PRN

## 2020-07-14 MED ORDER — PROPOFOL 10 MG/ML IV BOLUS
INTRAVENOUS | Status: AC
  Filled 2020-07-14: qty 20

## 2020-07-14 MED ORDER — PROPOFOL 500 MG/50ML IV EMUL
INTRAVENOUS | Status: DC | PRN
  Administered 2020-07-14: 50 ug/kg/min via INTRAVENOUS

## 2020-07-14 SURGICAL SUPPLY — 64 items
APL PRP STRL LF DISP 70% ISPRP (MISCELLANEOUS) ×1
BAG URIMETER BARDEX IC 350 (UROLOGICAL SUPPLIES) ×1 IMPLANT
BLADE CLIPPER SURG (BLADE) ×1 IMPLANT
CANISTER SUCT 3000ML PPV (MISCELLANEOUS) ×2 IMPLANT
CATH FOLEY 2WAY SLVR  5CC 16FR (CATHETERS) ×2
CATH FOLEY 2WAY SLVR 5CC 16FR (CATHETERS) IMPLANT
CHLORAPREP W/TINT 26 (MISCELLANEOUS) ×2 IMPLANT
COVER SURGICAL LIGHT HANDLE (MISCELLANEOUS) ×2 IMPLANT
COVER WAND RF STERILE (DRAPES) ×1 IMPLANT
DRAIN CHANNEL 19F RND (DRAIN) ×1 IMPLANT
DRAPE LAPAROSCOPIC ABDOMINAL (DRAPES) ×2 IMPLANT
DRAPE WARM FLUID 44X44 (DRAPES) ×2 IMPLANT
DRSG OPSITE POSTOP 4X10 (GAUZE/BANDAGES/DRESSINGS) IMPLANT
DRSG OPSITE POSTOP 4X8 (GAUZE/BANDAGES/DRESSINGS) IMPLANT
DRSG VAC ATS MED SENSATRAC (GAUZE/BANDAGES/DRESSINGS) ×1 IMPLANT
DURAPREP 26ML APPLICATOR (WOUND CARE) ×1 IMPLANT
ELECT BLADE 6.5 EXT (BLADE) ×1 IMPLANT
ELECT CAUTERY BLADE 6.4 (BLADE) IMPLANT
ELECT REM PT RETURN 9FT ADLT (ELECTROSURGICAL) ×2
ELECTRODE REM PT RTRN 9FT ADLT (ELECTROSURGICAL) ×1 IMPLANT
EVACUATOR SILICONE 100CC (DRAIN) ×1 IMPLANT
GLOVE BIO SURGEON STRL SZ7 (GLOVE) ×4 IMPLANT
GLOVE BIO SURGEON STRL SZ8 (GLOVE) ×1 IMPLANT
GLOVE BIOGEL PI IND STRL 7.5 (GLOVE) ×1 IMPLANT
GLOVE BIOGEL PI IND STRL 8 (GLOVE) IMPLANT
GLOVE BIOGEL PI INDICATOR 7.5 (GLOVE) ×2
GLOVE BIOGEL PI INDICATOR 8 (GLOVE) ×3
GLOVE SURG SS PI 7.0 STRL IVOR (GLOVE) ×2 IMPLANT
GLOVE SURG UNDER POLY LF SZ7 (GLOVE) ×1 IMPLANT
GOWN STRL REUS W/ TWL LRG LVL3 (GOWN DISPOSABLE) ×2 IMPLANT
GOWN STRL REUS W/ TWL XL LVL3 (GOWN DISPOSABLE) IMPLANT
GOWN STRL REUS W/TWL LRG LVL3 (GOWN DISPOSABLE) ×6
GOWN STRL REUS W/TWL XL LVL3 (GOWN DISPOSABLE) ×2
HANDLE SUCTION POOLE (INSTRUMENTS) ×1 IMPLANT
KIT BASIN OR (CUSTOM PROCEDURE TRAY) ×2 IMPLANT
KIT TURNOVER KIT B (KITS) ×2 IMPLANT
LIGASURE IMPACT 36 18CM CVD LR (INSTRUMENTS) ×1 IMPLANT
MANIFOLD NEPTUNE II (INSTRUMENTS) ×1 IMPLANT
NDL 18GX1X1/2 (RX/OR ONLY) (NEEDLE) IMPLANT
NEEDLE 18GX1X1/2 (RX/OR ONLY) (NEEDLE) ×2 IMPLANT
NS IRRIG 1000ML POUR BTL (IV SOLUTION) ×4 IMPLANT
PACK GENERAL/GYN (CUSTOM PROCEDURE TRAY) ×2 IMPLANT
PAD ARMBOARD 7.5X6 YLW CONV (MISCELLANEOUS) ×2 IMPLANT
PENCIL SMOKE EVACUATOR (MISCELLANEOUS) ×2 IMPLANT
RETAINER VISCERA MED (MISCELLANEOUS) ×1 IMPLANT
SPECIMEN JAR LARGE (MISCELLANEOUS) IMPLANT
SPONGE LAP 18X18 RF (DISPOSABLE) ×2 IMPLANT
STAPLER VISISTAT 35W (STAPLE) ×1 IMPLANT
SUCTION POOLE HANDLE (INSTRUMENTS) ×2
SUT ETHILON 2 0 FS 18 (SUTURE) ×1 IMPLANT
SUT PDS AB 1 TP1 96 (SUTURE) ×4 IMPLANT
SUT SILK 2 0 (SUTURE) ×2
SUT SILK 2 0 SH CR/8 (SUTURE) ×2 IMPLANT
SUT SILK 2-0 18XBRD TIE 12 (SUTURE) ×1 IMPLANT
SUT SILK 3 0 (SUTURE) ×2
SUT SILK 3 0 SH CR/8 (SUTURE) ×2 IMPLANT
SUT SILK 3-0 18XBRD TIE 12 (SUTURE) ×1 IMPLANT
SUT VIC AB 3-0 SH 27 (SUTURE)
SUT VIC AB 3-0 SH 27X BRD (SUTURE) IMPLANT
SYR 10ML LL (SYRINGE) ×1 IMPLANT
TOWEL GREEN STERILE (TOWEL DISPOSABLE) ×2 IMPLANT
TRAY FOLEY MTR SLVR 16FR STAT (SET/KITS/TRAYS/PACK) ×2 IMPLANT
TUBE CONNECTING 12X1/4 (SUCTIONS) ×1 IMPLANT
YANKAUER SUCT BULB TIP NO VENT (SUCTIONS) ×1 IMPLANT

## 2020-07-14 NOTE — Progress Notes (Signed)
eLink Physician-Brief Progress Note Patient Name: Patrick Chapman DOB: Jan 22, 1961 MRN: [id number]   Date of Service  07/14/2020  HPI/Events of Note  Patient s/p ex-lap with finding of hemoperitoneum, BP dropped blood pressure to 77/53 MAP 59.  eICU Interventions  LR 1000 ml iv fluid bolus. Stat H & H, Type and screen , close monitoring.        [name] Tarez Bowns 07/14/2020, 7:57 PM

## 2020-07-14 NOTE — Progress Notes (Addendum)
eLink Physician-Brief Progress Note Patient Name: FARHAD BURLESON DOB: [birth year] MRN: [id number]   Date of Service  07/14/2020  HPI/Events of Note  Hemoglobin stable at 14.7 gm, last serum albumin was 4.5 gm, Patient's blood pressure is back down to 83/55 with MAP of 65, HR 122. He had responded earlier in the night to a 100 ml iv fluid bolus with increase in blood pressure and reduction in heart rate.  eICU Interventions  Will give patient another 1 liter LR fluid bolus. Will insert intra-abdominal pressure monitoring Foley catheter. Will order a.m. echocardiogram to check bi-ventricular heart function and pulmonary artery pressures.        [name] Maverick Dieudonne 07/14/2020, 10:11 PM

## 2020-07-14 NOTE — Progress Notes (Signed)
On arrival to ICU, seal broken on Foley catheter

## 2020-07-14 NOTE — Progress Notes (Signed)
Brief Update  Lactate of 4.2 Additional liter bolus of LR ordered. Ordered re check lactic acid for 1500. All communicated with nursing.   Magdalen Spatz, MSN, AGACNP-BC [place] for personal pager  07/14/2020 11:18 AM

## 2020-07-14 NOTE — Anesthesia Preprocedure Evaluation (Signed)
Anesthesia Evaluation  Patient identified by MRN, date of birth, ID band Patient awake    Reviewed: Allergy & Precautions, NPO status , Patient's Chart, lab work & pertinent test results  Airway Mallampati: II  TM Distance: >3 FB Neck ROM: Full    Dental  (+) Teeth Intact, Chipped   Pulmonary sleep apnea and Continuous Positive Airway Pressure Ventilation , former smoker,    Pulmonary exam normal        Cardiovascular hypertension, Pt. on home beta blockers  Rhythm:Regular Rate:Tachycardia     Neuro/Psych    GI/Hepatic Neg liver ROS, GERD  ,  Endo/Other  negative endocrine ROS  Renal/GU negative Renal ROS     Musculoskeletal  (+) Arthritis ,   Abdominal (+) + obese,  Abdomen: tender.    Peds  Hematology negative hematology ROS (+)   Anesthesia Other Findings   Reproductive/Obstetrics                             Anesthesia Physical Anesthesia Plan  ASA: III and emergent  Anesthesia Plan: General   Post-op Pain Management:    Induction: Intravenous, Rapid sequence and Cricoid pressure planned  PONV Risk Score and Plan: 3 and Ondansetron, Dexamethasone and Midazolam  Airway Management Planned: Oral ETT and Video Laryngoscope Planned  Additional Equipment: Arterial line, CVP and Ultrasound Guidance Line Placement  Intra-op Plan:   Post-operative Plan: Possible Post-op intubation/ventilation  Informed Consent: I have reviewed the patients History and Physical, chart, labs and discussed the procedure including the risks, benefits and alternatives for the proposed anesthesia with the patient or authorized representative who has indicated his/her understanding and acceptance.     Dental advisory given  Plan Discussed with: CRNA  Anesthesia Plan Comments: (Lab Results      Component                Value               Date                      WBC                      13.8 (H)             07/14/2020                HGB                      15.8                07/14/2020                HCT                      50.3                07/14/2020                MCV                      99.0                07/14/2020                PLT  229                 07/14/2020            Lab Results      Component                Value               Date                      CREATININE               1.24                07/14/2020                BUN                      17                  07/14/2020                NA                       139                 07/14/2020                K                        3.3 (L)             07/14/2020                CL                       104                 07/14/2020                CO2                      25                  07/14/2020            Covid-19 Nucleic Acid Test Results Lab Results      Component                Value               Date                      SARSCOV2NAA              NEGATIVE            07/14/2020                [place]              NEGATIVE            10/08/2019                [place]              NEGATIVE            03/16/2019           )  Anesthesia Quick Evaluation  

## 2020-07-14 NOTE — Progress Notes (Signed)
Critical lactic acid 4.2, Eric Form NP notified.  No new orders at this time.

## 2020-07-14 NOTE — Anesthesia Procedure Notes (Signed)
Procedure Name: Intubation Date/Time: 07/14/2020 5:40 AM Performed by: Clovis Cao, CRNA Pre-anesthesia Checklist: Patient identified, Emergency Drugs available, Suction available, Patient being monitored and Timeout performed Patient Re-evaluated:Patient Re-evaluated prior to induction Oxygen Delivery Method: Circle system utilized Preoxygenation: Pre-oxygenation with 100% oxygen Induction Type: IV induction, Rapid sequence and Cricoid Pressure applied Laryngoscope Size: Miller and 2 Grade View: Grade I Tube type: Oral Tube size: 8.0 mm Number of attempts: 1 Airway Equipment and Method: Stylet Placement Confirmation: ETT inserted through vocal cords under direct vision,  positive ETCO2 and breath sounds checked- equal and bilateral Secured at: 23 cm Tube secured with: Tape Dental Injury: Teeth and Oropharynx as per pre-operative assessment

## 2020-07-14 NOTE — ED Triage Notes (Signed)
Pt with c/o LUQ abdominal pain that started 2 hrs ago. States he has had N/V/D since pain started. States "it's unlike any pain I've ever had".

## 2020-07-14 NOTE — Anesthesia Procedure Notes (Signed)
Central Venous Catheter Insertion Performed by: Effie Berkshire, MD, anesthesiologist Start/End12/02/2020 7:25 AM, 07/14/2020 7:30 AM Patient location: Pre-op. Preanesthetic checklist: patient identified, IV checked, site marked, risks and benefits discussed, surgical consent, monitors and equipment checked, pre-op evaluation, timeout performed and anesthesia consent Position: Trendelenburg Lidocaine 1% used for infiltration and patient sedated Hand hygiene performed , maximum sterile barriers used  and Seldinger technique used Catheter size: 8 Fr Total catheter length 16. Central line was placed.Double lumen Procedure performed using ultrasound guided technique. Ultrasound Notes:anatomy identified, needle tip was noted to be adjacent to the nerve/plexus identified, no ultrasound evidence of intravascular and/or intraneural injection and image(s) printed for medical record Attempts: 1 Following insertion, dressing applied, line sutured and Biopatch. Post procedure assessment: blood return through all ports  Patient tolerated the procedure well with no immediate complications.

## 2020-07-14 NOTE — Progress Notes (Signed)
JP drain has had 470 mL of bloody output since about 0745, Dr. Georgette Dover notified and stated to keep an eye for any Brenetta Penny or green drainage.

## 2020-07-14 NOTE — Transfer of Care (Signed)
Immediate Anesthesia Transfer of Care Note  Patient: Patrick Chapman  Procedure(s) Performed: EXPLORATORY LAPAROTOMY WITH REMOVAL OF LAP BAND AND COMPONENTS;  APPLICATION OF WOUND VAC (N/A Abdomen)  Patient Location: ICU  Anesthesia Type:General  Level of Consciousness: sedated and Patient remains intubated per anesthesia plan  Airway & Oxygen Therapy: Patient remains intubated per anesthesia plan and Patient placed on Ventilator (see vital sign flow sheet for setting)  Post-op Assessment: Report given to RN and Post -op Vital signs reviewed and stable  Post vital signs: Reviewed and stable  Last Vitals:  Vitals Value Taken Time  BP 179/115 07/14/20 0800  Temp    Pulse 128 07/14/20 0801  Resp 21 07/14/20 0801  SpO2 100 % 07/14/20 0801  Vitals shown include unvalidated device data.  Last Pain:  Vitals:   07/14/20 0157  TempSrc:   PainSc: 3          Complications: No complications documented.

## 2020-07-14 NOTE — Anesthesia Procedure Notes (Signed)
Arterial Line Insertion Start/End12/02/2020 5:32 AM, 07/14/2020 5:34 AM Performed by: Effie Berkshire, MD, anesthesiologist  Patient location: Pre-op. Preanesthetic checklist: patient identified, IV checked, site marked, risks and benefits discussed, surgical consent, monitors and equipment checked, pre-op evaluation, timeout performed and anesthesia consent Lidocaine 1% used for infiltration Left, radial was placed Catheter size: 20 Fr Hand hygiene performed  and maximum sterile barriers used   Attempts: 1 Procedure performed without using ultrasound guided technique. Following insertion, dressing applied. Post procedure assessment: normal and unchanged  Patient tolerated the procedure well with no immediate complications.

## 2020-07-14 NOTE — ED Notes (Signed)
Report called to [facility] OR.

## 2020-07-14 NOTE — Consult Note (Signed)
NAME:  Patrick Chapman, MRN:  [id number], DOB:  Jul 19, 1961, LOS: 0 ADMISSION DATE:  07/14/2020, CONSULTATION DATE:  07/14/2020 REFERRING MD: Donne Hazel, CHIEF COMPLAINT:  Pneumoperitoneum, vent management post op   Brief History   59 year old male former smoker ( Quit  1988 with a 10 pack year smoking history) admitted 12/7 for pneumoperitoneum , Exp lap and lap band removal by Dr. Donne Hazel. PCCM have been consulted for vent management post op.   History of present illness   59 year old male with history in  2009 of lap band and prior lap chole presents 12/7 early am with significant upper abdominal pain that started same day  and has progressed.  He has been vomiting.  No change in bms, no fevers.  Progressive ,constant pain and abdominal distension.   He had ct/cx[facility] at [facility] with finding of pneumoperitoneum and was transferred to [facility]. Taken to OR 12/7  by Dr. Donne Hazel urgently for Exp. Lap., lap band removal , placement of 19 French Blake Drain in epigastrium and wound vac placement.PCCM have been consulted by surgery for vent management post op.  Past Medical History   . ANTERIOR CERVICAL DECOMP/DISCECTOMY FUSION N/A 08/15/2014   Procedure: ANTERIOR CERVICAL DECOMPRESSION/DISCECTOMY FUSION 1 LEVEL;  Surgeon: Consuella Lose, MD;  Location: [place] NEURO ORS;  Service: Neurosurgery;  Laterality: N/A;  C56 anterior cervical fusion with interbody prosthesis plating and bonegraft C 5-6  . BIOPSY  10/10/2019   Procedure: BIOPSY;  Surgeon: Daneil Dolin, MD;  [facility]: [facility];  Service: Endoscopy;;  . CHOLECYSTECTOMY    . COLONOSCOPY WITH ESOPHAGOGASTRODUODENOSCOPY (EGD)  06/12/2012   Dr. Gala Romney: Status post prior lap band placement, otherwise normal study.  2 tubular adenomas removed from the colon.  Next colonoscopy in 5 years.  . COLONOSCOPY WITH PROPOFOL N/A 10/10/2019   Procedure: COLONOSCOPY WITH PROPOFOL;  Surgeon: Daneil Dolin, MD;  Location: [facility];  Service: Endoscopy;   Laterality: N/A;  11:00am  . LAPAROSCOPIC GASTRIC BANDING  2009   Dr. Hassell Done      [facility] Events   07/14/2020>> Admission  Consults:  12/7 PCCM  Procedures:  09/15/2019>> Exp Lap, removal of Lap Band ( Dr. Donne Hazel)  Significant Diagnostic Tests:  07/14/2020 CT Abdomen and Pelvis  With contrast Probable focal wall thickening inflammatory changes seen around the gastroduodenal junction and mid duodenum be due to perforated ulcer or duodenitis. Also findings of acute pancreatitis involving the pancreatic head. No evidence of pancreatic necrosis or loculated fluid collections. Extensive portal venous gas and findings suggestive pneumatosis within the proximal small bowel. Large amount of pneumoperitoneum Aortic Atherosclerosis (ICD10-I70.0).  Micro Data:  07/14/2020 SARS Coronavirus >> Negative Influenza A by PCR NEGATIVE NEGATIVE   Influenza B by PCR NEGATIVE NEGATIVE   07/14/2020 MRSA PCR Negative   Antimicrobials:  Zosyn 07/14/2020>>    Interim history/subjective:  Pt sedated and intubated, on Propofol at 40 mcg/kg/min Arouses with light stimulation, follows commands Wound vac is in place  Compliant with vent Arrived on Neo, and has been weaned off as MAP is > 65  Objective   Blood pressure 90/64, pulse (!) 126, temperature 97.6 F (36.4 C), temperature source Oral, resp. rate (!) 30, height 6' (1.829 m), weight 131.5 kg, SpO2 100 %.    Vent Mode: PRVC FiO2 (%):  [60 %] 60 % Set Rate:  [20 bmp] 20 bmp Vt Set:  [620 mL] 620 mL PEEP:  [5 cmH20] 5 cmH20 Plateau Pressure:  [24  cmH20] 24 cmH20   Intake/Output Summary (Last 24 hours) at 07/14/2020 0914 Last data filed at 07/14/2020 0759 Gross per 24 hour  Intake 5600 ml  Output 1050 ml  Net 4550 ml   Filed Weights   07/14/20 0033  Weight: 131.5 kg    Examination: General: sedated and intubated post op, compliant with vent, stable HENT: NCAT, thick neck, No LAD, No JVD, ETT is secure and intact,  NG tube to suction no drainage thus far.R IJ CVC Lungs: Bilateral chest excursion, Clear throughout, no wheeze, rales or rhonchi, Diminished per bases 2/2 distended abdomen Cardiovascular: S1, S2, No RMG , ST per tele, RRR Abdomen: Distended, quiet when NG clamped, Wound vac is in place and Intact, no drainage. Right JP is full of serosanguinous drainage Extremities: No obvious deformities, MAE x 4, brisk refill, no mottling, Left Radial A Line Neuro: Sedated but arouses easily to minimal stimulation, MAE x 4,Follows commands, makes eye contact. GU: Foley to gravity, clear amber urine in adequate amounts  Resolved Hospital Problem list     Assessment & Plan:  Acute Respiratory Failure/ Airway Protection for EXP Lap Minimal secretions Plan ABG now Decrease TV to 7 cc per KG Increase rate to 28 ABG in 2 hours and prn Wean FiO2  To 30% Continue PRVC VAP bundle ordered Will remain intubated over night to ensure no need for further OR Consider SBT in am  Fentanyl for sedation RASS -1 Goal    ? Pancreatitis per CT Plan Consider [place] RUQ Will transition off Propofol and start continuous fentanyl Trend Tryglycerides LR at 100 cc per hour 1 L bolus LR now Trend Lactate, LDH and Lipase Continue Zosyn Trend  Ranson's Score  Will check ionized calcium  With next blood gas  Hypotension Leukocytosis>> suspect reactive Afebrile Plan 1 L LR bolus now Careful sedation MAP goal > 65 mm Hg Blood Cultures   Hypokalemia Slight elevation of creatinine ( 1.25) Historically has elevated creatinine  Plan Trend BMET Monitor UO Replete electrolytes as needed Check Mag  Magdalen Spatz, MSN, AGACNP-BC [place] for personal pager  07/14/2020 10:48 AM   Best practice (evaluated daily)   Diet: NPO>> per primary team Pain/Anxiety/Delirium protocol (if indicated): Fentanyl VAP protocol (if indicated): ordered DVT prophylaxis: PAS hose>> will  defer to surgery for prophylaxis GI prophylaxis: Protonix Glucose control: CBG with SSI Mobility: BR last date of multidisciplinary goals of care discussion: NA Family and staff present NA Summary of discussion NA Follow up goals of care discussion due None Code Status: Full Disposition: ICU  Labs   CBC: Recent Labs  Lab 07/14/20 0101  WBC 13.8*  HGB 15.8  HCT 50.3  MCV 99.0  PLT 811    Basic Metabolic Panel: Recent Labs  Lab 07/14/20 0101  NA 139  K 3.3*  CL 104  CO2 25  GLUCOSE 237*  BUN 17  CREATININE 1.24  CALCIUM 9.1   GFR: Estimated Creatinine Clearance: 91.1 mL/min (by C-G formula based on SCr of 1.24 mg/dL). Recent Labs  Lab 07/14/20 0101  WBC 13.8*    Liver Function Tests: Recent Labs  Lab 07/14/20 0101  AST 24  ALT 22  ALKPHOS 53  BILITOT 0.5  PROT 8.1  ALBUMIN 4.5   Recent Labs  Lab 07/14/20 0101  LIPASE 1,068*   No results for input(s): AMMONIA in the last 168 hours.  ABG No results found for: PHART, PCO2ART, PO2ART, HCO3, TCO2, ACIDBASEDEF, O2SAT  Coagulation Profile: No results for input(s): INR, PROTIME in the last 168 hours.  Cardiac Enzymes: No results for input(s): CKTOTAL, CKMB, CKMBINDEX, TROPONINI in the last 168 hours.  HbA1C: Hgb A1c MFr Bld  Date/Time Value Ref Range Status  04/26/2018 10:13 AM 6.0 (H) 4.8 - 5.6 % Final    Comment:    (NOTE) Pre diabetes:          5.7%-6.4% Diabetes:              >6.4% Glycemic control for   <7.0% adults with diabetes     CBG: No results for input(s): GLUCAP in the last 168 hours.  Review of Systems:   Unable as patient is sedated and intubated  Past Medical History  He,  has a past medical history of GERD (gastroesophageal reflux disease), Hypertension, Obesity, and Sleep apnea.   Surgical History    Past Surgical History:  Procedure Laterality Date  . ANTERIOR CERVICAL DECOMP/DISCECTOMY FUSION N/A 08/15/2014   Procedure: ANTERIOR CERVICAL  DECOMPRESSION/DISCECTOMY FUSION 1 LEVEL;  Surgeon: Consuella Lose, MD;  Location: [place] NEURO ORS;  Service: Neurosurgery;  Laterality: N/A;  C56 anterior cervical fusion with interbody prosthesis plating and bonegraft C 5-6  . BIOPSY  10/10/2019   Procedure: BIOPSY;  Surgeon: Daneil Dolin, MD;  [facility];  Service: Endoscopy;;  . CHOLECYSTECTOMY    . COLONOSCOPY WITH ESOPHAGOGASTRODUODENOSCOPY (EGD)  06/12/2012   Dr. Gala Romney: Status post prior lap band placement, otherwise normal study.  2 tubular adenomas removed from the colon.  Next colonoscopy in 5 years.  . COLONOSCOPY WITH PROPOFOL N/A 10/10/2019   Procedure: COLONOSCOPY WITH PROPOFOL;  Surgeon: Daneil Dolin, MD;  [facility];  Service: Endoscopy;  Laterality: N/A;  11:00am  . LAPAROSCOPIC GASTRIC BANDING  2009   Dr. Hassell Done     Social History   reports that he quit smoking about 33 years ago. His smoking use included cigarettes. He started smoking about 43 years ago. He has a 10.00 pack-year smoking history. His smokeless tobacco use includes snuff. He reports previous alcohol use. He reports that he does not use drugs.   Family History   His family history includes Diabetes in his father; Heart attack in his father and mother. There is no history of Colon cancer.   Allergies Allergies  Allergen Reactions  . Codeine Nausea And Vomiting and Rash     Home Medications  Prior to Admission medications   Medication Sig Start Date End Date Taking? Authorizing Provider  acetaminophen (TYLENOL) 500 MG tablet Take 1,000 mg by mouth every 6 (six) hours as needed for moderate pain or headache.    [provider]  AZOR 5-20 MG tablet TAKE ONE (1) TABLET BY MOUTH EVERY DAY Patient taking differently: Take 1 tablet by mouth daily.  03/20/17   Arnoldo Lenis, MD  Cholecalciferol (VITAMIN D-3) 125 MCG (5000 UT) TABS Take 5,000 Units by mouth daily.    [provider]  dexlansoprazole (DEXILANT) 60 MG  capsule Take 1 capsule (60 mg total) by mouth daily. Patient taking differently: Take 60 mg by mouth See admin instructions. Take 1 capsule (60 mg) by mouth scheduled every morning, may take an additional capsule in the evening if needed for acid reflux/indigestion. 08/16/19   Erenest Rasher, PA-C  dicyclomine (BENTYL) 10 MG capsule Take 1 capsule (10 mg total) by mouth in the morning and at bedtime. 04/01/20   Annitta Needs, NP  loratadine (CLARITIN) 10  MG tablet Take 10 mg by mouth daily.    [provider]  metoprolol tartrate (LOPRESSOR) 50 MG tablet TAKE ONE TABLET BY MOUTH TWICE A DAY 11/22/19   Arnoldo Lenis, MD  tamsulosin (FLOMAX) 0.4 MG CAPS capsule Take 0.4 mg by mouth in the morning and at bedtime.     [provider]     Critical care time: 50 minutes

## 2020-07-14 NOTE — Progress Notes (Signed)
Belongings on admission: pair of shoes, pink shirt, boxers, shorts.

## 2020-07-14 NOTE — Progress Notes (Signed)
[facility] [place] Progress Note Patient Name: Patrick Chapman DOB: 24-May-1961 MRN: [id number]   Date of Service  07/14/2020  HPI/Events of Note  BP 75/51, MAP 59, Heart rate 129  eICU Interventions  Phenylephrine infusion ordered at 0-200 mcg.        [name] Jerene Yeager 07/14/2020, 11:04 PM

## 2020-07-14 NOTE — H&P (Signed)
Patrick Chapman is an 59 y.o. male.   Chief Complaint: ab pain HPI: 76 yom with history 2009 of lap band and prior lap chole presents with significant upper abdominal pain that started today and has progressed.  He has been vomiting.  No change in bms, no fevers.  Pain worsening with nothing he was doing at home helping. It is constant.  He is very distended now.  He had ct/cxr at [facility] with finding of pneumoperitoneum and was transferred here.    Past Medical History:  Diagnosis Date  . GERD (gastroesophageal reflux disease)   . Hypertension   . Obesity   . Sleep apnea    was tested, but had lap band surgery and with weight loss, no problems    Past Surgical History:  Procedure Laterality Date  . ANTERIOR CERVICAL DECOMP/DISCECTOMY FUSION N/A 08/15/2014   Procedure: ANTERIOR CERVICAL DECOMPRESSION/DISCECTOMY FUSION 1 LEVEL;  Surgeon: Consuella Lose, MD;  Location: [facility];  Service: Neurosurgery;  Laterality: N/A;  C56 anterior cervical fusion with interbody prosthesis plating and bonegraft C 5-6  . BIOPSY  10/10/2019   Procedure: BIOPSY;  Surgeon: Daneil Dolin, MD;  Location: [facility];  Service: Endoscopy;;  . CHOLECYSTECTOMY    . COLONOSCOPY WITH ESOPHAGOGASTRODUODENOSCOPY (EGD)  06/12/2012   Dr. Gala Romney: Status post prior lap band placement, otherwise normal study.  2 tubular adenomas removed from the colon.  Next colonoscopy in 5 years.  . COLONOSCOPY WITH PROPOFOL N/A 10/10/2019   Procedure: COLONOSCOPY WITH PROPOFOL;  Surgeon: Daneil Dolin, MD;  Location: [facility];  Service: Endoscopy;  Laterality: N/A;  11:00am  . LAPAROSCOPIC GASTRIC BANDING  2009   Dr. Hassell Done    Family History  Problem Relation Age of Onset  . Heart attack Mother        living  . Heart attack Father        living  . Diabetes Father   . Colon cancer Neg Hx    Social History:  reports that he quit smoking about 33 years ago. His smoking use included cigarettes. He started smoking about 43  years ago. He has a 10.00 pack-year smoking history. His smokeless tobacco use includes snuff. He reports previous alcohol use. He reports that he does not use drugs.  Allergies:  Allergies  Allergen Reactions  . Codeine Nausea And Vomiting and Rash   meds reviewed  Results for orders placed or performed during the hospital encounter of 07/14/20 (from the past 48 hour(s))  Lipase, blood     Status: Abnormal   Collection Time: 07/14/20  1:01 AM  Result Value Ref Range   Lipase 1,068 (H) 11 - 51 U/L    Comment: RESULTS CONFIRMED BY MANUAL DILUTION Performed at [facility], [address], [place], [place] [address]   Comprehensive metabolic panel     Status: Abnormal   Collection Time: 07/14/20  1:01 AM  Result Value Ref Range   Sodium 139 135 - 145 mmol/L   Potassium 3.3 (L) 3.5 - 5.1 mmol/L   Chloride 104 98 - 111 mmol/L   CO2 25 22 - 32 mmol/L   Glucose, Bld 237 (H) 70 - 99 mg/dL    Comment: Glucose reference range applies only to samples taken after fasting for at least 8 hours.   BUN 17 6 - 20 mg/dL   Creatinine, Ser 1.24 0.61 - 1.24 mg/dL   Calcium 9.1 8.9 - 10.3 mg/dL   Total Protein 8.1 6.5 - 8.1  g/dL   Albumin 4.5 3.5 - 5.0 g/dL   AST 24 15 - 41 U/L   ALT 22 0 - 44 U/L   Alkaline Phosphatase 53 38 - 126 U/L   Total Bilirubin 0.5 0.3 - 1.2 mg/dL   GFR, Estimated >60 >60 mL/min    Comment: (NOTE) Calculated using the CKD-EPI Creatinine Equation (2021)    Anion gap 10 5 - 15    Comment: Performed at [facility], [address], [place], [place] [address]  CBC     Status: Abnormal   Collection Time: 07/14/20  1:01 AM  Result Value Ref Range   WBC 13.8 (H) 4.0 - 10.5 K/uL   RBC 5.08 4.22 - 5.81 MIL/uL   Hemoglobin 15.8 13.0 - 17.0 g/dL   HCT 50.3 39 - 52 %   MCV 99.0 80.0 - 100.0 fL   MCH 31.1 26.0 - 34.0 pg   MCHC 31.4 30.0 - 36.0 g/dL   RDW 13.7 11.5 - 15.5 %   Platelets 229 150 - 400 K/uL   nRBC 0.0 0.0 - 0.2 %    Comment: Performed at [facility],  [address] [place], [place] [address]  Troponin I (High Sensitivity)     Status: None   Collection Time: 07/14/20  1:01 AM  Result Value Ref Range   Troponin I (High Sensitivity) 2 <18 ng/L    Comment: (NOTE) Elevated high sensitivity troponin I (hsTnI) values and significant  changes across serial measurements may suggest ACS but many other  chronic and acute conditions are known to elevate hsTnI results.  Refer to the Links section for chest pain algorithms and additional  guidance. Performed at [facility], [address], [place], [place] [address]   Resp Panel by RT-PCR (Flu A&B, Covid) Nasopharyngeal Swab     Status: None   Collection Time: 07/14/20  1:16 AM   Specimen: Nasopharyngeal Swab; Nasopharyngeal(NP) swabs in vial transport medium  Result Value Ref Range   SARS Coronavirus 2 by RT PCR NEGATIVE NEGATIVE    Comment: (NOTE) SARS-CoV-2 target nucleic acids are NOT DETECTED.  The SARS-CoV-2 RNA is generally detectable in upper respiratory specimens during the acute phase of infection. The lowest concentration of SARS-CoV-2 viral copies this assay can detect is 138 copies/mL. A negative result does not preclude SARS-Cov-2 infection and should not be used as the sole basis for treatment or other patient management decisions. A negative result may occur with  improper specimen collection/handling, submission of specimen other than nasopharyngeal swab, presence of viral mutation(s) within the areas targeted by this assay, and inadequate number of viral copies(<138 copies/mL). A negative result must be combined with clinical observations, patient history, and epidemiological information. The expected result is Negative.  Fact Sheet for Patients:  EntrepreneurPulse.com.au  Fact Sheet for Healthcare Providers:  IncredibleEmployment.be  This test is no t yet approved or cleared by the [place] FDA and  has been authorized for detection  and/or diagnosis of SARS-CoV-2 by FDA under an Emergency Use Authorization (EUA). This EUA will remain  in effect (meaning this test can be used) for the duration of the COVID-19 declaration under Section 564(b)(1) of the Act, 21 U.S.C.section 360bbb-3(b)(1), unless the authorization is terminated  or revoked sooner.       Influenza A by PCR NEGATIVE NEGATIVE   Influenza B by PCR NEGATIVE NEGATIVE    Comment: (NOTE) The Xpert Xpress SARS-CoV-2/FLU/RSV plus assay is intended as an aid in the diagnosis of influenza from  Nasopharyngeal swab specimens and should not be used as a sole basis for treatment. Nasal washings and aspirates are unacceptable for Xpert Xpress SARS-CoV-2/FLU/RSV testing.  Fact Sheet for Patients: EntrepreneurPulse.com.au  Fact Sheet for Healthcare Providers: IncredibleEmployment.be  This test is not yet approved or cleared by the [place] FDA and has been authorized for detection and/or diagnosis of SARS-CoV-2 by FDA under an Emergency Use Authorization (EUA). This EUA will remain in effect (meaning this test can be used) for the duration of the COVID-19 declaration under Section 564(b)(1) of the Act, 21 U.S.C. section 360bbb-3(b)(1), unless the authorization is terminated or revoked.  Performed at [facility], [address], [place], [place] [address]   Troponin I (High Sensitivity)     Status: None   Collection Time: 07/14/20  2:38 AM  Result Value Ref Range   Troponin I (High Sensitivity) 5 <18 ng/L    Comment: (NOTE) Elevated high sensitivity troponin I (hsTnI) values and significant  changes across serial measurements may suggest ACS but many other  chronic and acute conditions are known to elevate hsTnI results.  Refer to the "Links" section for chest pain algorithms and additional  guidance. Performed at [facility], [address], [place], [place] [address]    CT ABDOMEN PELVIS W CONTRAST  Result  Date: 07/14/2020 CLINICAL DATA:  Abdominal pain question of infection EXAM: CT ABDOMEN AND PELVIS WITH CONTRAST TECHNIQUE: Multidetector CT imaging of the abdomen and pelvis was performed using the standard protocol following bolus administration of intravenous contrast. CONTRAST:  135mL OMNIPAQUE IOHEXOL 300 MG/ML  SOLN COMPARISON:  March 11, 2019 FINDINGS: Lower chest: The visualized heart size within normal limits. No pericardial fluid/thickening. No hiatal hernia. The visualized portions of the lungs are clear. Hepatobiliary: Extensive portal venous gas is seen throughout. The patient is status post cholecystectomy. Pancreas: There is fat stranding changes seen around the pancreatic head. However no peripancreatic fluid collections noted. Spleen: Normal in size without focal abnormality. Adrenals/Urinary Tract: Both adrenal glands appear normal. The kidneys and collecting system appear normal without evidence of urinary tract calculus or hydronephrosis. Bladder is unremarkable. Stomach/Bowel: There is a dilated air-filled stomach. A small amount of refluxed fluid is seen within the distal esophagus. A gastric lap band is present. NG tube is seen within the stomach. There appears to be wall thickening at the gastroduodenal junction and mid duodenum with surrounding inflammatory changes. There is free air extending into the lesser curvature of the stomach and within the porta hepatis. There is also pneumatosis with a mildly dilated proximal small bowel measuring up to 3 cm in dimension to the level the distal ileum. The distal ileal loops appear to be decompressed. There is a moderate amount of colonic stool without evidence of colonic wall thickening. Vascular/Lymphatic: There are no enlarged mesenteric, retroperitoneal, or pelvic lymph nodes. Scattered aortic atherosclerotic calcifications are seen without aneurysmal dilatation. Reproductive: The prostate is unremarkable. Other: No evidence of abdominal wall  mass or hernia. Musculoskeletal: No acute or significant osseous findings. IMPRESSION: 1. Probable focal wall thickening inflammatory changes seen around the gastroduodenal junction and mid duodenum be due to perforated ulcer or duodenitis. 2. Also findings of acute pancreatitis involving the pancreatic head. No evidence of pancreatic necrosis or loculated fluid collections. 3. Extensive portal venous gas and findings suggestive pneumatosis within the proximal small bowel. 4. Large amount of pneumoperitoneum 5.  Aortic Atherosclerosis (ICD10-I70.0). 6. These results were called by telephone at the time of interpretation on 07/14/2020 at 2:27 am  to provider [facility] , who verbally acknowledged these results. Electronically Signed   By: Prudencio Pair M.D.   On: 07/14/2020 02:28   DG Chest Portable 1 View  Result Date: 07/14/2020 CLINICAL DATA:  Abdominal pain EXAM: PORTABLE CHEST 1 VIEW COMPARISON:  None. FINDINGS: The heart size and mediastinal contours are within normal limits. Both lungs are clear. There is a large amount of free air seen under the bilateral hemidiaphragms. A dilated air-filled stomach is noted. IMPRESSION: Large amount of pneumoperitoneum under the hemidiaphragms. These results were called by telephone at the time of interpretation on 07/14/2020 at 1:27 am to provider [facility] , who verbally acknowledged these results. Electronically Signed   By: Prudencio Pair M.D.   On: 07/14/2020 01:32    Review of Systems  Respiratory: Positive for shortness of breath.   Gastrointestinal: Positive for abdominal distention, abdominal pain and vomiting.  All other systems reviewed and are negative.   Blood pressure 90/64, pulse (!) 126, temperature 97.6 F (36.4 C), temperature source Oral, resp. rate (!) 30, height 6' (1.829 m), weight 131.5 kg, SpO2 96 %. Physical Exam Constitutional:      General: He is in acute distress.  Eyes:     General: No scleral icterus. Cardiovascular:     Rate  and Rhythm: Regular rhythm. Tachycardia present.  Pulmonary:     Breath sounds: Normal breath sounds.     Comments: tachynpeic  Abdominal:     General: A surgical scar is present. Bowel sounds are absent. There is distension.     Tenderness: There is abdominal tenderness in the right upper quadrant and left upper quadrant.     Hernia: No hernia is present.  Skin:    General: Skin is warm and dry.  Neurological:     General: No focal deficit present.     Mental Status: He is alert.  Psychiatric:        Mood and Affect: Mood normal.        Behavior: Behavior normal.      Assessment/Plan Pneumoperitoneum -I think this may be perforated du. Regardless he needs to go to OR urgently. I think sob is due to intraabdominal process.  Discussed elap with repair or resection of whatever is perforated. Discussed possibility of second operation as well. Discussed possible removal of his lap band depending on how things go during the case as well.  Will proceed asap  Rolm Bookbinder, MD 07/14/2020, 5:01 AM

## 2020-07-14 NOTE — Op Note (Signed)
Preoperative diagnosis pneumoperitoneum Postoperative dx: saa Procedure 1. Exploratory laparotomy 2. Removal of lap band and components 3. Application of wound vac Surgeon: Dr Serita Grammes Asst: Dr Georganna Skeans EBL: 100 cc Anesthesia: general Complications none Specimens lap band to pathology Sponge and needle count correct times two dispo ICU critical  Indications:  This is 62 yom with lap band since 2009 who presents with tachycardia, tachypnea and pneumoperitoneum on ct scan. He was transferred here and I discussed going to OR.  Procedure: After consent was obtained the patient was taken the operating.  He was given antibiotics.  SCDs were in place.  Complication.  An arterial line and a central venous line placed.  Foley catheter was placed.  He was prepped and draped in standard sterile surgical fashion.  Surgical template.  I made a midline xiphoid down to his umbilicus.  I ran into his LAP-BAND port as well as the tubing and I remove the port from the right side of the abdominal wall and sent this on the left so I can enter into his abdomen.  I entered into his peritoneal cavity with a large rush.  I then inserted a Bookwalter retractor.  I then proceeded to explore his entire abdomen.  The anterior wall of the stomach as well as the posterior wall when I opened up his lesser sac were completely normal.  I ensured that the NG tube was in the correct position.  I was able to release some adhesions from his prior cholecystectomy in the first and second portions of his duodenum which were both normal as well.  There is no evidence of any perforation.  I then ran his small bowel from the ligament of Treitz to the cecum.  There was some congestion but this was all viable and there was no evidence of any perforation.  I then examined his entire colon and this again was without injury or without perforation.  There was no fluid present in his abdomen when I began.  This was all just a  pneumoperitoneum.  Once I done this I then looked at his lap band.  I put some water in his epigastrium.  It did appear to be some bubbling from the region of his LAP-BAND.  I then elected to remove his LAP-BAND.  I used combination of cautery and sharp dissection to remove the tubing from the adhesions that had to the liver.  I then was able to identify the buckle and the band anteriorly.  I opened this with cautery and with sharp dissection.  I then remove the fluid from the band.  I then was able to get underneath the band and divided it with the curved Mayo scissors.  With some additional dissection to get it free I then remove the LAP-BAND at all of the components associated with that.  There was no obvious hole.  I did discuss with Dr. Hassell Done he did his lap band and elected to just place a 19 [place] Blake drain in his epigastrium going over to his left upper quadrant.  This was secured with a 2-0 nylon suture.  I then closed his abdomen with #1 looped PDS.  He was quite distended but his peak pressure states the same during the closure.  I then placed him back on his wound without closing the skin.  He tolerated this fairly well and is transferred to the ICU in critical condition.

## 2020-07-14 NOTE — ED Provider Notes (Signed)
Emergency Department Provider Note  I have reviewed the triage vital signs and the nursing notes.  HISTORY  Chief Complaint Abdominal Pain   HPI Patrick Chapman is a 59 y.o. male who presents the emerge department today with severe left upper quadrant abdominal pain.  Patient has a history of a lap band from over 10 years ago.  He states he does normally vomit but he has been vomiting with this.  States it feels like severe pain and distention.  Never had anything like it.  He has a history of kidney stones is nothing like that.  He has no urinary symptoms.  No fevers.  No other associated symptoms.   No other associated or modifying symptoms.    Past Medical History:  Diagnosis Date  . GERD (gastroesophageal reflux disease)   . Hypertension   . Obesity   . Sleep apnea    was tested, but had lap band surgery and with weight loss, no problems    Patient Active Problem List   Diagnosis Date Noted  . Diarrhea 03/26/2019  . History of colon polyps 03/26/2019  . Morbid obesity ([place]) 04/26/2017  . Palpitations 04/25/2017  . Cervical spondylosis with radiculopathy 08/15/2014  . LUQ abdominal pain 01/14/2011  . GERD (gastroesophageal reflux disease) 11/25/2010  . Epigastric pain 11/25/2010  . Hyperlipidemia 10/16/2009  . CHEST PAIN UNSPECIFIED 10/16/2009  . OVERWEIGHT 10/13/2009  . Essential hypertension 10/13/2009    Past Surgical History:  Procedure Laterality Date  . ANTERIOR CERVICAL DECOMP/DISCECTOMY FUSION N/A 08/15/2014   Procedure: ANTERIOR CERVICAL DECOMPRESSION/DISCECTOMY FUSION 1 LEVEL;  Surgeon: Consuella Lose, MD;  Location: [place] [facility];  Service: Neurosurgery;  Laterality: N/A;  C56 anterior cervical fusion with interbody prosthesis plating and bonegraft C 5-6  . BIOPSY  10/10/2019   Procedure: BIOPSY;  Surgeon: Daneil Dolin, MD;  Location: [facility];  Service: Endoscopy;;  . CHOLECYSTECTOMY    . COLONOSCOPY WITH ESOPHAGOGASTRODUODENOSCOPY (EGD)   06/12/2012   Dr. Gala Romney: Status post prior lap band placement, otherwise normal study.  2 tubular adenomas removed from the colon.  Next colonoscopy in 5 years.  . COLONOSCOPY WITH PROPOFOL N/A 10/10/2019   Procedure: COLONOSCOPY WITH PROPOFOL;  Surgeon: Daneil Dolin, MD;  Location: [facility];  Service: Endoscopy;  Laterality: N/A;  11:00am  . LAPAROSCOPIC GASTRIC BANDING  2009   Dr. Hassell Done    Current Outpatient Rx  . Order #: [id number] Class: Historical Med  . Order #: [id number] Class: Normal  . Order #: [id number] Class: Historical Med  . Order #: [id number] Class: Normal  . Order #: [id number] Class: Normal  . Order #: [id number] Class: Historical Med  . Order #: [id number] Class: Normal  . Order #: [id number] Class: Historical Med    Allergies Codeine  Family History  Problem Relation Age of Onset  . Heart attack Mother        living  . Heart attack Father        living  . Diabetes Father   . Colon cancer Neg Hx     Social History Social History   Tobacco Use  . Smoking status: Former Smoker    Packs/day: 1.00    Years: 10.00    Pack years: 10.00    Types: Cigarettes    Start date: 05/23/1977    Quit date: 05/24/1987    Years since quitting: 33.1  . Smokeless tobacco: Current User    Types: Snuff  Vaping Use  . Vaping Use: Never used  Substance Use Topics  .  Alcohol use: Not Currently    Alcohol/week: 0.0 standard drinks    Comment: occassional  . Drug use: No    Review of Systems  All other systems negative except as documented in the HPI. All pertinent positives and negatives as reviewed in the HPI. ____________________________________________  PHYSICAL EXAM:  VITAL SIGNS: ED Triage Vitals  Enc Vitals Group     BP 07/14/20 0032 (!) 151/99     Pulse Rate 07/14/20 0032 96     Resp 07/14/20 0032 (!) 28     Temp --      Temp src --      SpO2 07/14/20 0032 100 %     Weight 07/14/20 0033 290 lb (131.5 kg)     Height 07/14/20 0033 6' (1.829 m)     Head  Circumference --      Peak Flow --      Pain Score 07/14/20 0033 10     Pain Loc --      Pain Edu? --      Excl. in [place]? --     Constitutional: Alert and oriented. Well appearing and in obvious distress from pain clutching his left upper abdomen. Eyes: Conjunctivae are normal. PERRL. EOMI. Head: Atraumatic. Nose: No congestion/rhinnorhea. Mouth/Throat: Mucous membranes are moist.  Oropharynx non-erythematous. Neck: No stridor.  No meningeal signs.   Cardiovascular: Tachycardic rate, regular rhythm. Good peripheral circulation. Grossly normal heart sounds.   Respiratory: Normal respiratory effort.  No retractions. Lungs CTAB. Gastrointestinal: Soft and nontender. No distention.  Musculoskeletal: No lower extremity tenderness nor edema. No gross deformities of extremities. Neurologic:  Normal speech and language. No gross focal neurologic deficits are appreciated.  Skin:  Skin is warm, dry and intact. No rash noted.  ____________________________________________   LABS (all labs ordered are listed, but only abnormal results are displayed)  Labs Reviewed  LIPASE, BLOOD - Abnormal; Notable for the following components:      Result Value   Lipase 1,068 (*)    All other components within normal limits  COMPREHENSIVE METABOLIC PANEL - Abnormal; Notable for the following components:   Potassium 3.3 (*)    Glucose, Bld 237 (*)    All other components within normal limits  CBC - Abnormal; Notable for the following components:   WBC 13.8 (*)    All other components within normal limits  RESP PANEL BY RT-PCR (FLU A&B, COVID) [id number]  URINALYSIS, ROUTINE W REFLEX MICROSCOPIC  TROPONIN I (HIGH SENSITIVITY)  TROPONIN I (HIGH SENSITIVITY)   ____________________________________________  EKG  EKG Interpretation  Date/Time:    Ventricular Rate:    PR Interval:    QRS Duration:   QT Interval:    QTC Calculation:   R Axis:     Text Interpretation:         ____________________________________________  RADIOLOGY  CT ABDOMEN PELVIS W CONTRAST  Result Date: 07/14/2020 CLINICAL DATA:  Abdominal pain question of infection EXAM: CT ABDOMEN AND PELVIS WITH CONTRAST TECHNIQUE: Multidetector CT imaging of the abdomen and pelvis was performed using the standard protocol following bolus administration of intravenous contrast. CONTRAST:  167mL OMNIPAQUE IOHEXOL 300 MG/ML  SOLN COMPARISON:  March 11, 2019 FINDINGS: Lower chest: The visualized heart size within normal limits. No pericardial fluid/thickening. No hiatal hernia. The visualized portions of the lungs are clear. Hepatobiliary: Extensive portal venous gas is seen throughout. The patient is status post cholecystectomy. Pancreas: There is fat stranding changes seen around the pancreatic head. However no peripancreatic fluid collections noted.  Spleen: Normal in size without focal abnormality. Adrenals/Urinary Tract: Both adrenal glands appear normal. The kidneys and collecting system appear normal without evidence of urinary tract calculus or hydronephrosis. Bladder is unremarkable. Stomach/Bowel: There is a dilated air-filled stomach. A small amount of refluxed fluid is seen within the distal esophagus. A gastric lap band is present. NG tube is seen within the stomach. There appears to be wall thickening at the gastroduodenal junction and mid duodenum with surrounding inflammatory changes. There is free air extending into the lesser curvature of the stomach and within the porta hepatis. There is also pneumatosis with a mildly dilated proximal small bowel measuring up to 3 cm in dimension to the level the distal ileum. The distal ileal loops appear to be decompressed. There is a moderate amount of colonic stool without evidence of colonic wall thickening. Vascular/Lymphatic: There are no enlarged mesenteric, retroperitoneal, or pelvic lymph nodes. Scattered aortic atherosclerotic calcifications are seen without  aneurysmal dilatation. Reproductive: The prostate is unremarkable. Other: No evidence of abdominal wall mass or hernia. Musculoskeletal: No acute or significant osseous findings. IMPRESSION: 1. Probable focal wall thickening inflammatory changes seen around the gastroduodenal junction and mid duodenum be due to perforated ulcer or duodenitis. 2. Also findings of acute pancreatitis involving the pancreatic head. No evidence of pancreatic necrosis or loculated fluid collections. 3. Extensive portal venous gas and findings suggestive pneumatosis within the proximal small bowel. 4. Large amount of pneumoperitoneum 5.  Aortic Atherosclerosis (ICD10-I70.0). 6. These results were called by telephone at the time of interpretation on 07/14/2020 at 2:27 am to provider [facility] , who verbally acknowledged these results. Electronically Signed   By: Prudencio Pair M.D.   On: 07/14/2020 02:28   DG Chest Portable 1 View  Result Date: 07/14/2020 CLINICAL DATA:  Abdominal pain EXAM: PORTABLE CHEST 1 VIEW COMPARISON:  None. FINDINGS: The heart size and mediastinal contours are within normal limits. Both lungs are clear. There is a large amount of free air seen under the bilateral hemidiaphragms. A dilated air-filled stomach is noted. IMPRESSION: Large amount of pneumoperitoneum under the hemidiaphragms. These results were called by telephone at the time of interpretation on 07/14/2020 at 1:27 am to provider [facility] , who verbally acknowledged these results. Electronically Signed   By: Prudencio Pair M.D.   On: 07/14/2020 01:32   ____________________________________________  PROCEDURES  Procedure(s) performed:   .Critical Care Performed by: Merrily Pew, MD Authorized by: Merrily Pew, MD   Critical care provider statement:    Critical care time (minutes):  95   Critical care was necessary to treat or prevent imminent or life-threatening deterioration of the following conditions:  Shock, toxidrome, dehydration  and respiratory failure   Critical care was time spent personally by me on the following activities:  Discussions with consultants, evaluation of patient's response to treatment, examination of patient, ordering and performing treatments and interventions, ordering and review of laboratory studies, ordering and review of radiographic studies, pulse oximetry, re-evaluation of patient's condition, obtaining history from patient or surrogate and review of old charts   ____________________________________________  INITIAL IMPRESSION / ASSESSMENT AND PLAN    This patient presents to the ED for concern of abdominal, this involves an extensive number of treatment options, and is a complaint that carries with it a high risk of complications and morbidity.  The differential diagnosis includes lab band malfunction causing a outlet obstruction, other bowel obstruction, perforated viscus and less likely kidney stone  Additional history obtained:   Additional history  obtained from noone  Previous records obtained and reviewed in epic  ED Course  Clinical Course as of Jul 15 823  Tue Jul 14, 2020  0110 DG Chest Portable 1 View [JM]  0120 Obvious free air with large amount of gastric air.     [JM]  0121 Spoke with Dr. Arnoldo Morale around 0115, wants to proceed with CT scan, requests: NG, zosyn, fluids, covid test, call back after CT   [JM]  0121 Updated patient and wife at bedside, agreeable to plan   [JM]  0122 noted  WBC(!): 13.8 [JM]  0148 Proceed with contrasted CT  Creatinine: 1.24 [JM]  0148 Reportedly has significant improvement with NG and dilaudid   [JM]  0219 Discussed with radiology. No obvious source of air, pneumatosis, gastric enlargement, portal venous gas   CT ABDOMEN PELVIS W CONTRAST [JM]  0227 Discussed with surgery, Dr. Arnoldo Morale again. Stated he could not do surgery here without an ICU bed. Will attempt to transfer to [facility].    [JM]  0305 Attempting to contact [facility] at [facility].  Paged Dr. Donne Hazel directly. Awaiting response.    [JM]  0319 Still no reply from surgery Contacted ER staff, no active surgical issues right now.    [JM]  [phone] I discussed with Dr. Barry Dienes, who is not on-call for the accepting hospital but is a partner of them.  She stated that sometimes there is an issue with paging people through the computer and suggested I try over the phone.  She said she will also make efforts to try to locate him and make him aware of the pending pages.   [JM]  [phone] Patient with developing hypoxia. I think it is from the anatomic dilation of the stomach causing decreased lung expansion with the diaphragm in combination with pain medication however patient is mildly tachypneic.   [JM]  0325 Nasal cannula O2 does not help and I think is because he is breathing through his mouth will put on a nonrebreather for same   [JM]  0347 Verified with [facility], not able to reach Dr. Donne Hazel. Will attempt to contact [place].    [JM]  7741 Attempted to contact [place], Judeth Horn at [phone]. Left voicemail to call back.  Will attempt to contact Dr. Arnoldo Morale again.    [JM]  9470 No call backs.  Will attempt to contact [place] at [facility] to see if we can make a bed here.    [JM]  [phone] Discussed with Dr. Donne Hazel, accepts to ER. Discussed with Dr. Betsey Holiday and is aware of patient.  Discussed with family. Aware of plan.  Patient with shaking/chills - blankets given. IV tylenol ordered.  Plan for [facility] for emergent transport.     [JM]    Clinical Course User Index [JM] Jessye Imhoff, Corene Cornea, MD     Medicines ordered:  Medications  HYDROmorphone (DILAUDID) injection 1 mg (1 mg Intravenous Given 07/14/20 0051)  lactated ringers bolus 1,000 mL (0 mLs Intravenous Stopped 07/14/20 0157)  ondansetron (ZOFRAN) injection 4 mg (4 mg Intravenous Given 07/14/20 0051)  piperacillin-tazobactam (ZOSYN) IVPB 3.375 g (0 g Intravenous Stopped 07/14/20 0157)  lactated ringers bolus 1,000 mL (1,000 mLs  Intravenous New Bag/Given 07/14/20 0124)  HYDROmorphone (DILAUDID) injection 1 mg (1 mg Intravenous Given 07/14/20 0126)  iohexol (OMNIPAQUE) 300 MG/ML solution 100 mL (100 mLs Intravenous Contrast Given 07/14/20 0159)    CRITICAL INTERVENTIONS:  . Multiple consultations . Iv abx . Prompt evaluation, ct scan . Fluids . Pain meds . Oxygen  FINAL IMPRESSION AND PLAN Final diagnoses:  Abdominal pain, unspecified abdominal location  Free intraperitoneal air   Medical screening exam was performed and I feel the patient has had appropriate emergency department evaluation and work-up for their chief complaint and is ready for transfer and ADMISSION to the hospital at this time.  ____________________________________________   NEW OUTPATIENT MEDICATIONS STARTED DURING THIS VISIT:  New Prescriptions   No medications on file    Note:  This note was prepared with assistance of Dragon voice recognition software. Occasional wrong-word or sound-a-like substitutions may have occurred due to the inherent limitations of voice recognition software.   Palmer Fahrner, Corene Cornea, MD 07/14/20 [phone]

## 2020-07-15 ENCOUNTER — Encounter (HOSPITAL_COMMUNITY): Payer: Self-pay | Admitting: General Surgery

## 2020-07-15 ENCOUNTER — Inpatient Hospital Stay (HOSPITAL_COMMUNITY): Payer: PRIVATE HEALTH INSURANCE

## 2020-07-15 DIAGNOSIS — I428 Other cardiomyopathies: Secondary | ICD-10-CM

## 2020-07-15 DIAGNOSIS — K668 Other specified disorders of peritoneum: Secondary | ICD-10-CM | POA: Diagnosis not present

## 2020-07-15 DIAGNOSIS — R109 Unspecified abdominal pain: Secondary | ICD-10-CM | POA: Diagnosis not present

## 2020-07-15 LAB — LACTIC ACID, PLASMA: Lactic Acid, Venous: 2.2 mmol/L (ref 0.5–1.9)

## 2020-07-15 LAB — POCT I-STAT 7, (LYTES, BLD GAS, ICA,H+H)
Acid-base deficit: 3 mmol/L — ABNORMAL HIGH (ref 0.0–2.0)
Acid-base deficit: 3 mmol/L — ABNORMAL HIGH (ref 0.0–2.0)
Bicarbonate: 18.3 mmol/L — ABNORMAL LOW (ref 20.0–28.0)
Bicarbonate: 20.7 mmol/L (ref 20.0–28.0)
Calcium, Ion: 1.07 mmol/L — ABNORMAL LOW (ref 1.15–1.40)
Calcium, Ion: 1.1 mmol/L — ABNORMAL LOW (ref 1.15–1.40)
HCT: 41 % (ref 39.0–52.0)
HCT: 41 % (ref 39.0–52.0)
Hemoglobin: 13.9 g/dL (ref 13.0–17.0)
Hemoglobin: 13.9 g/dL (ref 13.0–17.0)
O2 Saturation: 98 %
O2 Saturation: 96 %
Patient temperature: 98.8
Patient temperature: 98.5
Potassium: 4.6 mmol/L (ref 3.5–5.1)
Potassium: 4.6 mmol/L (ref 3.5–5.1)
Sodium: 136 mmol/L (ref 135–145)
Sodium: 137 mmol/L (ref 135–145)
TCO2: 19 mmol/L — ABNORMAL LOW (ref 22–32)
TCO2: 22 mmol/L (ref 22–32)
pCO2 arterial: 22.8 mmHg — ABNORMAL LOW (ref 32.0–48.0)
pCO2 arterial: 33.4 mmHg (ref 32.0–48.0)
pH, Arterial: 7.512 — ABNORMAL HIGH (ref 7.350–7.450)
pH, Arterial: 7.4 (ref 7.350–7.450)
pO2, Arterial: 91 mmHg (ref 83.0–108.0)
pO2, Arterial: 84 mmHg (ref 83.0–108.0)

## 2020-07-15 LAB — COMPREHENSIVE METABOLIC PANEL
ALT: 41 U/L (ref 0–44)
AST: 41 U/L (ref 15–41)
Albumin: 2.6 g/dL — ABNORMAL LOW (ref 3.5–5.0)
Alkaline Phosphatase: 26 U/L — ABNORMAL LOW (ref 38–126)
Anion gap: 11 (ref 5–15)
BUN: 34 mg/dL — ABNORMAL HIGH (ref 6–20)
CO2: 18 mmol/L — ABNORMAL LOW (ref 22–32)
Calcium: 7.9 mg/dL — ABNORMAL LOW (ref 8.9–10.3)
Chloride: 106 mmol/L (ref 98–111)
Creatinine, Ser: 2.63 mg/dL — ABNORMAL HIGH (ref 0.61–1.24)
GFR, Estimated: 27 mL/min — ABNORMAL LOW (ref >60)
Glucose, Bld: 122 mg/dL — ABNORMAL HIGH (ref 70–99)
Potassium: 4.8 mmol/L (ref 3.5–5.1)
Sodium: 135 mmol/L (ref 135–145)
Total Bilirubin: 3.1 mg/dL — ABNORMAL HIGH (ref 0.3–1.2)
Total Protein: 5.1 g/dL — ABNORMAL LOW (ref 6.5–8.1)

## 2020-07-15 LAB — MAGNESIUM: Magnesium: 1.5 mg/dL — ABNORMAL LOW (ref 1.7–2.4)

## 2020-07-15 LAB — CBC
HCT: 44.4 % (ref 39.0–52.0)
Hemoglobin: 14.3 g/dL (ref 13.0–17.0)
MCH: 30.5 pg (ref 26.0–34.0)
MCHC: 32.2 g/dL (ref 30.0–36.0)
MCV: 94.7 fL (ref 80.0–100.0)
Platelets: 215 10*3/uL (ref 150–400)
RBC: 4.69 MIL/uL (ref 4.22–5.81)
RDW: 14.4 % (ref 11.5–15.5)
WBC: 12.3 10*3/uL — ABNORMAL HIGH (ref 4.0–10.5)
nRBC: 0 % (ref 0.0–0.2)

## 2020-07-15 LAB — ECHOCARDIOGRAM COMPLETE
Area-P 1/2: 6.27 cm2
Calc EF: 51.2 %
Height: 72 in
S' Lateral: 4.4 cm
Single Plane A2C EF: 50.1 %
Single Plane A4C EF: 55.9 %
Weight: 4640 oz

## 2020-07-15 LAB — GLUCOSE, CAPILLARY
Glucose-Capillary: 107 mg/dL — ABNORMAL HIGH (ref 70–99)
Glucose-Capillary: 110 mg/dL — ABNORMAL HIGH (ref 70–99)
Glucose-Capillary: 123 mg/dL — ABNORMAL HIGH (ref 70–99)
Glucose-Capillary: 128 mg/dL — ABNORMAL HIGH (ref 70–99)
Glucose-Capillary: 79 mg/dL (ref 70–99)
Glucose-Capillary: 97 mg/dL (ref 70–99)
Glucose-Capillary: 98 mg/dL (ref 70–99)

## 2020-07-15 LAB — LACTATE DEHYDROGENASE: LDH: 217 U/L — ABNORMAL HIGH (ref 98–192)

## 2020-07-15 LAB — SODIUM, URINE, RANDOM: Sodium, Ur: <10 mmol/L

## 2020-07-15 LAB — TRIGLYCERIDES: Triglycerides: 227 mg/dL — ABNORMAL HIGH (ref <150)

## 2020-07-15 LAB — LIPASE, BLOOD: Lipase: 51 U/L (ref 11–51)

## 2020-07-15 LAB — SURGICAL PATHOLOGY

## 2020-07-15 LAB — CREATININE, URINE, RANDOM: Creatinine, Urine: 344.05 mg/dL

## 2020-07-15 MED ORDER — SODIUM CHLORIDE 0.9% FLUSH: 10.0000 mL | INTRAVENOUS | Status: DC | PRN

## 2020-07-15 MED ORDER — ALBUMIN HUMAN 25 % IV SOLN
12.5000 g | INTRAVENOUS | Status: AC
  Administered 2020-07-15 (×2): 12.5 g via INTRAVENOUS
  Filled 2020-07-15 (×2): qty 50

## 2020-07-15 MED ORDER — DEXMEDETOMIDINE HCL IN NACL 400 MCG/100ML IV SOLN
0.4000 ug/kg/h | INTRAVENOUS | Status: DC
  Administered 2020-07-15 – 2020-07-16 (×3): 0.4 ug/kg/h via INTRAVENOUS
  Filled 2020-07-15 (×3): qty 100

## 2020-07-15 MED ORDER — PERFLUTREN LIPID MICROSPHERE
1.0000 mL | INTRAVENOUS | Status: AC | PRN
  Administered 2020-07-15: 2 mL via INTRAVENOUS
  Filled 2020-07-15: qty 10

## 2020-07-15 MED ORDER — SODIUM CHLORIDE 0.9% FLUSH
10.0000 mL | Freq: Two times a day (BID) | INTRAVENOUS | Status: DC
  Administered 2020-07-15 – 2020-07-16 (×3): 10 mL
  Administered 2020-07-16: 30 mL
  Administered 2020-07-17 (×2): 10 mL
  Administered 2020-07-18: 40 mL
  Administered 2020-07-18: 10 mL
  Administered 2020-07-19: 40 mL
  Administered 2020-07-19: 10 mL
  Administered 2020-07-20: 40 mL
  Administered 2020-07-20 – 2020-07-22 (×4): 10 mL
  Administered 2020-07-22: 30 mL
  Administered 2020-07-23 – 2020-07-24 (×3): 10 mL

## 2020-07-15 MED ORDER — HEPARIN SODIUM (PORCINE) 5000 UNIT/ML IJ SOLN
5000.0000 [IU] | Freq: Three times a day (TID) | INTRAMUSCULAR | Status: DC
  Administered 2020-07-15 – 2020-07-19 (×12): 5000 [IU] via SUBCUTANEOUS
  Filled 2020-07-15 (×12): qty 1

## 2020-07-15 MED ORDER — ALBUMIN HUMAN 5 % IV SOLN
12.5000 g | Freq: Once | INTRAVENOUS | Status: AC
  Administered 2020-07-15: 12.5 g via INTRAVENOUS
  Filled 2020-07-15: qty 250

## 2020-07-15 MED ORDER — MAGNESIUM SULFATE 2 GM/50ML IV SOLN
2.0000 g | Freq: Once | INTRAVENOUS | Status: AC
  Administered 2020-07-15: 2 g via INTRAVENOUS
  Filled 2020-07-15: qty 50

## 2020-07-15 MED ORDER — ALBUMIN HUMAN 5 % IV SOLN: 25.0000 g | Freq: Once | INTRAVENOUS | Status: DC

## 2020-07-15 NOTE — Progress Notes (Signed)
eLink Physician-Brief Progress Note Patient Name: NORBERT MALKIN DOB: 20-Aug-1960 MRN: [id number]   Date of Service  07/15/2020  HPI/Events of Note  Patient dropped his blood pressure again I to the 70's SBP.  eICU Interventions  Stat CBC, lactic acid, CMP, Albumin 5 % 250 ml iv x 1.        [name] Jaterrius Ricketson 07/15/2020, 6:03 AM

## 2020-07-15 NOTE — Progress Notes (Signed)
  Echocardiogram 2D Echocardiogram with definity has been performed.  Darlina Sicilian M 07/15/2020, 7:57 AM

## 2020-07-15 NOTE — Procedures (Signed)
Extubation Procedure Note  Patient Details:   Name: Patrick Chapman DOB: 01-15-1961 MRN: [id number]   Airway Documentation:    Vent end date: 07/15/20 Vent end time: 1555   Evaluation  O2 sats: stable throughout Complications: No apparent complications Patient did tolerate procedure well. Bilateral Breath Sounds: Clear, Diminished   Patient extubated per MD order & placed on 3L [place]. Patient is alert & oriented. Able to speak & has good strong cough.  Kathie Dike 07/15/2020, 4:03 PM

## 2020-07-15 NOTE — Progress Notes (Signed)
[facility] Progress Note Patient Name: ARJUN HARD DOB: [birth year] MRN: [id number]   Date of Service  07/15/2020  HPI/Events of Note  ABG reviewed.  eICU Interventions  Tidal volume reduced to 6 ml / kg and respiratory rate reduced to 24, ABG at 5 a.m.        [name] Donathan Buller 07/15/2020, 3:50 AM

## 2020-07-15 NOTE — Progress Notes (Signed)
1 Day Post-Op   Subjective/Chief Complaint: Patient remains intubated, sedated Neosynephrine gtt added overnight, as well as fluid boluses, for soft blood pressure Sinus tachycardia Large serosanguinous drain output, but Hgb unchanged  Objective: Vital signs in last 24 hours: Temp:  [98.5 F (36.9 C)-98.9 F (37.2 C)] 98.5 F (36.9 C) (12/08 0400) Pulse Rate:  [107-130] 117 (12/08 0800) Resp:  [20-28] 24 (12/08 0800) BP: (87-147)/(60-106) 117/84 (12/08 0800) SpO2:  [94 %-100 %] 94 % (12/08 0800) Arterial Line BP: (74-162)/(52-90) 109/67 (12/08 0800) FiO2 (%):  [30 %] 30 % (12/08 0309)    Intake/Output from previous day: 12/07 0701 - 12/08 0700 In: 7984.1 [I.V.:4794.6; IV Piggyback:3139.5] Out: 4268 [Urine:975; Drains:845] Intake/Output this shift: Total I/O In: 185.9 [I.V.:185.9] Out: 45 [Urine:45]  Obese, intubated, sedated Abd - distended; quiet; Midline VAC intact with good seal Clear yellow UOP via Foley  Lab Results:  Recent Labs    07/14/20 1420 07/14/20 2009 07/15/20 0508 07/15/20 0510  WBC 6.4  --   --  12.3*  HGB 14.7   < > 13.9 14.3  HCT 45.6   < > 41.0 44.4  PLT 223  --   --  215   < > = values in this interval not displayed.   BMET Recent Labs    07/14/20 0101 07/14/20 0957 07/15/20 0508 07/15/20 0510  NA 139   < > 137 135  K 3.3*   < > 4.6 4.8  CL 104  --   --  106  CO2 25  --   --  18*  GLUCOSE 237*  --   --  122*  BUN 17  --   --  34*  CREATININE 1.24  --   --  2.63*  CALCIUM 9.1  --   --  7.9*   < > = values in this interval not displayed.   Hepatic Function Latest Ref Rng & Units 07/15/2020 07/14/2020 02/11/2019  Total Protein 6.5 - 8.1 g/dL 5.1(L) 8.1 7.1  Albumin 3.5 - 5.0 g/dL 2.6(L) 4.5 3.9  AST 15 - 41 U/L 41 24 20  ALT 0 - 44 U/L 41 22 28  Alk Phosphatase 38 - 126 U/L 26(L) 53 55  Total Bilirubin 0.3 - 1.2 mg/dL 3.1(H) 0.5 0.4   Lipase     Component Value Date/Time   LIPASE 51 07/15/2020 0510    PT/INR No results for  input(s): LABPROT, INR in the last 72 hours. ABG Recent Labs    07/15/20 0307 07/15/20 0508  PHART 7.512* 7.400  HCO3 18.3* 20.7    Studies/Results: CT ABDOMEN PELVIS W CONTRAST  Result Date: 07/14/2020 CLINICAL DATA:  Abdominal pain question of infection EXAM: CT ABDOMEN AND PELVIS WITH CONTRAST TECHNIQUE: Multidetector CT imaging of the abdomen and pelvis was performed using the standard protocol following bolus administration of intravenous contrast. CONTRAST:  132mL OMNIPAQUE IOHEXOL 300 MG/ML  SOLN COMPARISON:  March 11, 2019 FINDINGS: Lower chest: The visualized heart size within normal limits. No pericardial fluid/thickening. No hiatal hernia. The visualized portions of the lungs are clear. Hepatobiliary: Extensive portal venous gas is seen throughout. The patient is status post cholecystectomy. Pancreas: There is fat stranding changes seen around the pancreatic head. However no peripancreatic fluid collections noted. Spleen: Normal in size without focal abnormality. Adrenals/Urinary Tract: Both adrenal glands appear normal. The kidneys and collecting system appear normal without evidence of urinary tract calculus or hydronephrosis. Bladder is unremarkable. Stomach/Bowel: There is a dilated air-filled stomach. A small amount  of refluxed fluid is seen within the distal esophagus. A gastric lap band is present. NG tube is seen within the stomach. There appears to be wall thickening at the gastroduodenal junction and mid duodenum with surrounding inflammatory changes. There is free air extending into the lesser curvature of the stomach and within the porta hepatis. There is also pneumatosis with a mildly dilated proximal small bowel measuring up to 3 cm in dimension to the level the distal ileum. The distal ileal loops appear to be decompressed. There is a moderate amount of colonic stool without evidence of colonic wall thickening. Vascular/Lymphatic: There are no enlarged mesenteric,  retroperitoneal, or pelvic lymph nodes. Scattered aortic atherosclerotic calcifications are seen without aneurysmal dilatation. Reproductive: The prostate is unremarkable. Other: No evidence of abdominal wall mass or hernia. Musculoskeletal: No acute or significant osseous findings. IMPRESSION: 1. Probable focal wall thickening inflammatory changes seen around the gastroduodenal junction and mid duodenum be due to perforated ulcer or duodenitis. 2. Also findings of acute pancreatitis involving the pancreatic head. No evidence of pancreatic necrosis or loculated fluid collections. 3. Extensive portal venous gas and findings suggestive pneumatosis within the proximal small bowel. 4. Large amount of pneumoperitoneum 5.  Aortic Atherosclerosis (ICD10-I70.0). 6. These results were called by telephone at the time of interpretation on 07/14/2020 at 2:27 am to provider [facility] , who verbally acknowledged these results. Electronically Signed   By: Prudencio Pair M.D.   On: 07/14/2020 02:28   DG CHEST PORT 1 VIEW  Result Date: 07/14/2020 CLINICAL DATA:  Intubated EXAM: PORTABLE CHEST 1 VIEW COMPARISON:  07/14/2020, CT 07/14/2020 FINDINGS: Interval intubation, tip of the endotracheal tube is about 2.5 cm superior to the carina. Esophageal tube tip below the diaphragm but incompletely visualized. Right IJ central venous catheter tip over the SVC. Low lung volumes with hazy atelectasis at the bases. Stable enlarged cardiomediastinal silhouette. Resolution of previously noted free air beneath the diaphragms. IMPRESSION: Endotracheal tube tip about 2.5 cm superior to the carina. Placement of esophageal tube and right IJ catheter as above. Low lung volumes with hazy atelectasis at the bases. Cardiomegaly. Electronically Signed   By: Donavan Foil M.D.   On: 07/14/2020 19:14   DG Chest Portable 1 View  Result Date: 07/14/2020 CLINICAL DATA:  Abdominal pain EXAM: PORTABLE CHEST 1 VIEW COMPARISON:  None. FINDINGS: The heart  size and mediastinal contours are within normal limits. Both lungs are clear. There is a large amount of free air seen under the bilateral hemidiaphragms. A dilated air-filled stomach is noted. IMPRESSION: Large amount of pneumoperitoneum under the hemidiaphragms. These results were called by telephone at the time of interpretation on 07/14/2020 at 1:27 am to provider [facility] , who verbally acknowledged these results. Electronically Signed   By: Prudencio Pair M.D.   On: 07/14/2020 01:32    Anti-infectives: Anti-infectives (From admission, onward)   Start     Dose/Rate Route Frequency Ordered Stop   07/14/20 0900  piperacillin-tazobactam (ZOSYN) IVPB 3.375 g  Status:  Discontinued        3.375 g 100 mL/hr over 30 Minutes Intravenous Every 8 hours 07/14/20 0800 07/14/20 0802   07/14/20 0900  piperacillin-tazobactam (ZOSYN) IVPB 3.375 g        3.375 g 12.5 mL/hr over 240 Minutes Intravenous Every 8 hours 07/14/20 0804     07/14/20 0515  ceFAZolin (ANCEF) IVPB 2g/100 mL premix  Status:  Discontinued        2 g 200 mL/hr over 30  Minutes Intravenous  Once 07/14/20 0501 07/14/20 0753   07/14/20 0115  piperacillin-tazobactam (ZOSYN) IVPB 3.375 g        3.375 g 100 mL/hr over 30 Minutes Intravenous  Once 07/14/20 0111 07/14/20 0157      Assessment/Plan: Pneumoperitoneum s/p exploratory laparotomy - no obvious perforation identified Elevated lipase yesterday, but normalized today - inflammation around duodenum seen on CT yesterday, but wouldn't expect pancreatitis to resolve this quickly. Lap Band removed Drain - no bilious output  Wean vent as tolerated If extubated, would give ice chips only until we are able to get a Gastrografin swallow to evaluate his anatomy. Continue drain and antibiotics       LOS: 1 day    Maia Petties 07/15/2020

## 2020-07-15 NOTE — Progress Notes (Signed)
[place] NP of continued low UOP and sudden 1L output through NGT.    1400 on unit notified of another 1L output through NGT.

## 2020-07-15 NOTE — Progress Notes (Signed)
NAME:  Patrick Chapman, MRN:  [id number], DOB:  11/16/1960, LOS: 1 ADMISSION DATE:  07/14/2020, CONSULTATION DATE:  07/14/2020 REFERRING MD: Donne Hazel, CHIEF COMPLAINT:  Pneumoperitoneum, vent management post op   Brief History   59 year old male former smoker ( Quit  1988 with a 10 pack year smoking history) admitted 12/7 for pneumoperitoneum , Exp lap and lap band removal by Dr. Donne Hazel. PCCM have been consulted for vent management post op.  History of present illness   59 year old male with history in  2009 of lap band and prior lap chole presents 12/7 early am with significant upper abdominal pain that started same day  and has progressed.  He has been vomiting.  No change in bms, no fevers.  Progressive ,constant pain and abdominal distension.   He had ct/cxr at [facility] with finding of pneumoperitoneum and was transferred to [facility]. Taken to OR 12/7  by Dr. Donne Hazel urgently for Exp. Lap., lap band removal , placement of 19 French Blake Drain in epigastrium and wound vac placement.PCCM have been consulted by surgery for vent management post op.  Past Medical History  Sleep apnea Obesity HTN GERD   Significant Hospital Events   07/14/2020 > Admission  Consults:  12/7 PCCM  Procedures:  09/15/2019>> Exp Lap, removal of Lap Band ( Dr. Donne Hazel)  Significant Diagnostic Tests:   07/14/2020 CT Abdomen and Pelvis  With contrast > Probable focal wall thickening inflammatory changes seen around the gastroduodenal junction and mid duodenum be due to perforated ulcer or duodenitis. Also findings of acute pancreatitis involving the pancreatic head. No evidence of pancreatic necrosis or loculated fluidcollections.Extensive portal venous gas and findings suggestive pneumatosiswithin the proximal small bowel.Large amount of pneumoperitoneum  CXR 12/7 > Endotracheal tube tip about 2.5 cm superior to the carina. Placement of esophageal tube and right IJ catheter as above. Low lung volumes with hazy  atelectasis at the bases. Cardiomegaly.  Micro Data:  SARS Coronavirus 12/7  > Negative MRSA PCR Negative 12/7 > Negative  Blood culture 12/7 >  Antimicrobials:  Zosyn 07/14/2020>  Interim history/subjective:  Issues with hypotension overnight  Remains on pressor support   Objective   Blood pressure 112/86, pulse (!) 110, temperature 98.5 F (36.9 C), temperature source Axillary, resp. rate (!) 24, height 6' (1.829 m), weight 131.5 kg, SpO2 94 %. CVP:  [7 mmHg] 7 mmHg  Vent Mode: PRVC FiO2 (%):  [30 %-60 %] 30 % Set Rate:  [20 bmp-28 bmp] 24 bmp Vt Set:  [460 mL-620 mL] 460 mL PEEP:  [5 cmH20] 5 cmH20 Plateau Pressure:  [13 cmH20-28 cmH20] 28 cmH20   Intake/Output Summary (Last 24 hours) at 07/15/2020 0721 Last data filed at 07/15/2020 0600 Gross per 24 hour  Intake 6703.08 ml  Output 1545 ml  Net 5158.08 ml   Filed Weights   07/14/20 0033  Weight: 131.5 kg    Examination: General: Obese middle middle aged male lying in bed on mechanical ventilation, in NAD HEENT: ETT, MM pink/moist, PERRL,  Neuro: Will open eyes to verbal stimuli, able to follow simple commands  CV: s1s2 regular rate and rhythm, no murmur, rubs, or gallops,  PULM:  Clear to ascultation, no added breath sounds, no increased work of breathing  GI: soft, bowel sounds active in all 4 quadrants, non-tender, non-distended, midline incision with wound vac in place Extremities: warm/dry, non-pitting generalized edema  Skin: no rashes or lesions  Resolved Hospital Problem list     Assessment &  Plan:  Acute postoperative respiratory insufficiency post  EXP Lap P: Continue ventilator support with lung protective strategies  Trial SAT/SBT is able to tolerated and pain is controlled may be able to extubate soon  Wean PEEP and FiO2 for sats greater than 90%. Head of bed elevated 30 degrees. Plateau pressures less than 30 cm H20.  Follow intermittent chest x-ray and ABG.   SAT/SBT as tolerated, mentation  preclude extubation  Ensure adequate pulmonary hygiene  Follow cultures  VAP bundle in place  PAD protocol RASS goal 0 to -1  Hypotension  -likely multifactorial; sedation related and recent surgery P: Wean pressors as able  Would like to switch to levo if pressor requirement persist  Trend CBC  Currently 9L+ favor albumin vs pressors for further hypotension  Follow cultures   ? Pancreatitis per CT -Lipase also elevated at 1,068 on admission, this quickly improved post IV hydration and surgery  P: Lipase greatly improved POD 1 Stop propofol in favor of fentanyl and precedex  Continue IVF at reduced rate  Continue empiric antibiotics  Pain control   Hypokalemia Hypomagnesia  P: Supplement as neede  Trend Bmet  Acute Kidney Injury  -Baseline creatinine 1.3-1.8 creatinine on admission 1.24 with spike of 2.63 post surgery  P: Follow renal function / urine output Trend Bmet Avoid nephrotoxins Ensure adequate renal perfusion  IV hydration Consider renal ultrasound  Best practice (evaluated daily)  Diet: NPO: per primary team Pain/Anxiety/Delirium protocol (if indicated): Fentanyl VAP protocol (if indicated): ordered DVT prophylaxis: PAS hose>> will defer to surgery for prophylaxis GI prophylaxis: Protonix Glucose control: CBG with SSI Mobility: BR last date of multidisciplinary goals of care discussion: NA Family and staff present NA Summary of discussion NA Follow up goals of care discussion due None Code Status: Full Disposition: ICU  Labs   CBC: Recent Labs  Lab 07/14/20 0101 07/14/20 0957 07/14/20 1420 07/14/20 2009 07/15/20 0307 07/15/20 0508 07/15/20 0510  WBC 13.8*  --  6.4  --   --   --  12.3*  HGB 15.8   < > 14.7 14.7 13.9 13.9 14.3  HCT 50.3   < > 45.6 43.1 41.0 41.0 44.4  MCV 99.0  --  94.4  --   --   --  94.7  PLT 229  --  223  --   --   --  215   < > = values in this interval not displayed.    Basic Metabolic Panel: Recent Labs  Lab  07/14/20 0101 07/14/20 0957 07/14/20 1112 07/15/20 0307 07/15/20 0508 07/15/20 0510  NA 139 140  --  136 137 135  K 3.3* 4.8  --  4.6 4.6 4.8  CL 104  --   --   --   --  106  CO2 25  --   --   --   --  18*  GLUCOSE 237*  --   --   --   --  122*  BUN 17  --   --   --   --  34*  CREATININE 1.24  --   --   --   --  2.63*  CALCIUM 9.1  --   --   --   --  7.9*  MG  --   --  1.4*  --   --  1.5*   GFR: Estimated Creatinine Clearance: 43 mL/min (A) (by C-G formula based on SCr of 2.63 mg/dL (H)). Recent Labs  Lab 07/14/20 0101 07/14/20  9528 07/14/20 1420 07/15/20 0510  WBC 13.8*  --  6.4 12.3*  LATICACIDVEN  --  4.2* 3.5* 2.2*    Liver Function Tests: Recent Labs  Lab 07/14/20 0101 07/15/20 0510  AST 24 41  ALT 22 41  ALKPHOS 53 26*  BILITOT 0.5 3.1*  PROT 8.1 5.1*  ALBUMIN 4.5 2.6*   Recent Labs  Lab 07/14/20 0101 07/15/20 0510  LIPASE 1,068* 51   No results for input(s): AMMONIA in the last 168 hours.  ABG    Component Value Date/Time   PHART 7.400 07/15/2020 0508   PCO2ART 33.4 07/15/2020 0508   PO2ART 84 07/15/2020 0508   HCO3 20.7 07/15/2020 0508   TCO2 22 07/15/2020 0508   ACIDBASEDEF 3.0 (H) 07/15/2020 0508   O2SAT 96.0 07/15/2020 0508     Coagulation Profile: No results for input(s): INR, PROTIME in the last 168 hours.  Cardiac Enzymes: No results for input(s): CKTOTAL, CKMB, CKMBINDEX, TROPONINI in the last 168 hours.  HbA1C: Hgb A1c MFr Bld  Date/Time Value Ref Range Status  07/14/2020 11:12 AM 5.7 (H) 4.8 - 5.6 % Final    Comment:    (NOTE) Pre diabetes:          5.7%-6.4%  Diabetes:              >6.4%  Glycemic control for   <7.0% adults with diabetes   04/26/2018 10:13 AM 6.0 (H) 4.8 - 5.6 % Final    Comment:    (NOTE) Pre diabetes:          5.7%-6.4% Diabetes:              >6.4% Glycemic control for   <7.0% adults with diabetes     CBG: Recent Labs  Lab 07/14/20 1137 07/14/20 1539 07/14/20 1951 07/15/20 0013  07/15/20 0359  GLUCAP 120* 124* 119* 128* 110*      Critical care time:   CRITICAL CARE Performed by: Johnsie Cancel  Total critical care time: 38 minutes  Critical care time was exclusive of separately billable procedures and treating other patients.  Critical care was necessary to treat or prevent imminent or life-threatening deterioration.  Critical care was time spent personally by me on the following activities: development of treatment plan with patient and/or surrogate as well as nursing, discussions with consultants, evaluation of patient's response to treatment, examination of patient, obtaining history from patient or surrogate, ordering and performing treatments and interventions, ordering and review of laboratory studies, ordering and review of radiographic studies, pulse oximetry and re-evaluation of patient's condition.  Johnsie Cancel, NP-C [facility] Contact / Pager information can be found on Amion  07/15/2020, 7:28 AM

## 2020-07-15 NOTE — Progress Notes (Signed)
Elink called regarding recent ABG results.

## 2020-07-16 DIAGNOSIS — A419 Sepsis, unspecified organism: Secondary | ICD-10-CM | POA: Diagnosis not present

## 2020-07-16 DIAGNOSIS — R6521 Severe sepsis with septic shock: Secondary | ICD-10-CM | POA: Diagnosis not present

## 2020-07-16 LAB — BASIC METABOLIC PANEL
Anion gap: 11 (ref 5–15)
BUN: 36 mg/dL — ABNORMAL HIGH (ref 6–20)
CO2: 21 mmol/L — ABNORMAL LOW (ref 22–32)
Calcium: 7.8 mg/dL — ABNORMAL LOW (ref 8.9–10.3)
Chloride: 106 mmol/L (ref 98–111)
Creatinine, Ser: 1.84 mg/dL — ABNORMAL HIGH (ref 0.61–1.24)
GFR, Estimated: 42 mL/min — ABNORMAL LOW (ref >60)
Glucose, Bld: 102 mg/dL — ABNORMAL HIGH (ref 70–99)
Potassium: 4.3 mmol/L (ref 3.5–5.1)
Sodium: 138 mmol/L (ref 135–145)

## 2020-07-16 LAB — PHOSPHORUS: Phosphorus: 3.2 mg/dL (ref 2.5–4.6)

## 2020-07-16 LAB — HEPATIC FUNCTION PANEL
ALT: 33 U/L (ref 0–44)
AST: 35 U/L (ref 15–41)
Albumin: 2.6 g/dL — ABNORMAL LOW (ref 3.5–5.0)
Alkaline Phosphatase: 28 U/L — ABNORMAL LOW (ref 38–126)
Bilirubin, Direct: 0.5 mg/dL — ABNORMAL HIGH (ref 0.0–0.2)
Indirect Bilirubin: 0.9 mg/dL (ref 0.3–0.9)
Total Bilirubin: 1.4 mg/dL — ABNORMAL HIGH (ref 0.3–1.2)
Total Protein: 5 g/dL — ABNORMAL LOW (ref 6.5–8.1)

## 2020-07-16 LAB — CBC
HCT: 36.8 % — ABNORMAL LOW (ref 39.0–52.0)
Hemoglobin: 11.5 g/dL — ABNORMAL LOW (ref 13.0–17.0)
MCH: 30.6 pg (ref 26.0–34.0)
MCHC: 31.3 g/dL (ref 30.0–36.0)
MCV: 97.9 fL (ref 80.0–100.0)
Platelets: 159 10*3/uL (ref 150–400)
RBC: 3.76 MIL/uL — ABNORMAL LOW (ref 4.22–5.81)
RDW: 14.3 % (ref 11.5–15.5)
WBC: 9.2 10*3/uL (ref 4.0–10.5)
nRBC: 0 % (ref 0.0–0.2)

## 2020-07-16 LAB — GLUCOSE, CAPILLARY
Glucose-Capillary: 101 mg/dL — ABNORMAL HIGH (ref 70–99)
Glucose-Capillary: 101 mg/dL — ABNORMAL HIGH (ref 70–99)
Glucose-Capillary: 103 mg/dL — ABNORMAL HIGH (ref 70–99)
Glucose-Capillary: 93 mg/dL (ref 70–99)
Glucose-Capillary: 88 mg/dL (ref 70–99)
Glucose-Capillary: 97 mg/dL (ref 70–99)

## 2020-07-16 LAB — MAGNESIUM: Magnesium: 2.1 mg/dL (ref 1.7–2.4)

## 2020-07-16 MED ORDER — METHOCARBAMOL 1000 MG/10ML IJ SOLN
1000.0000 mg | Freq: Three times a day (TID) | INTRAVENOUS | Status: DC
  Administered 2020-07-16 – 2020-07-21 (×15): 1000 mg via INTRAVENOUS
  Filled 2020-07-16 (×21): qty 10

## 2020-07-16 MED ORDER — ACETAMINOPHEN 10 MG/ML IV SOLN
1000.0000 mg | Freq: Four times a day (QID) | INTRAVENOUS | Status: AC
  Administered 2020-07-16 – 2020-07-17 (×4): 1000 mg via INTRAVENOUS
  Filled 2020-07-16 (×4): qty 100

## 2020-07-16 MED ORDER — ORAL CARE MOUTH RINSE
15.0000 mL | Freq: Two times a day (BID) | OROMUCOSAL | Status: DC
  Administered 2020-07-17 – 2020-07-22 (×11): 15 mL via OROMUCOSAL

## 2020-07-16 MED ORDER — MORPHINE SULFATE (PF) 2 MG/ML IV SOLN
1.0000 mg | INTRAVENOUS | Status: DC | PRN
  Administered 2020-07-16 (×2): 2 mg via INTRAVENOUS
  Administered 2020-07-17 (×5): 4 mg via INTRAVENOUS
  Administered 2020-07-18: 2 mg via INTRAVENOUS
  Administered 2020-07-18: 4 mg via INTRAVENOUS
  Administered 2020-07-18 – 2020-07-19 (×5): 2 mg via INTRAVENOUS
  Administered 2020-07-19: 4 mg via INTRAVENOUS
  Administered 2020-07-19: 2 mg via INTRAVENOUS
  Administered 2020-07-19 – 2020-07-21 (×9): 4 mg via INTRAVENOUS
  Administered 2020-07-21 (×3): 2 mg via INTRAVENOUS
  Administered 2020-07-21: 4 mg via INTRAVENOUS
  Administered 2020-07-21 – 2020-07-22 (×2): 2 mg via INTRAVENOUS
  Administered 2020-07-22 (×2): 4 mg via INTRAVENOUS
  Administered 2020-07-22: 2 mg via INTRAVENOUS
  Administered 2020-07-22: 4 mg via INTRAVENOUS
  Administered 2020-07-22 – 2020-07-23 (×2): 2 mg via INTRAVENOUS
  Filled 2020-07-16: qty 1
  Filled 2020-07-16: qty 2
  Filled 2020-07-16 (×2): qty 1
  Filled 2020-07-16: qty 2
  Filled 2020-07-16: qty 1
  Filled 2020-07-16: qty 2
  Filled 2020-07-16 (×3): qty 1
  Filled 2020-07-16: qty 2
  Filled 2020-07-16: qty 1
  Filled 2020-07-16 (×3): qty 2
  Filled 2020-07-16: qty 1
  Filled 2020-07-16: qty 2
  Filled 2020-07-16: qty 1
  Filled 2020-07-16 (×3): qty 2
  Filled 2020-07-16: qty 1
  Filled 2020-07-16 (×2): qty 2
  Filled 2020-07-16 (×4): qty 1
  Filled 2020-07-16: qty 2
  Filled 2020-07-16 (×2): qty 1
  Filled 2020-07-16 (×2): qty 2
  Filled 2020-07-16 (×2): qty 1
  Filled 2020-07-16: qty 2
  Filled 2020-07-16: qty 1
  Filled 2020-07-16 (×3): qty 2

## 2020-07-16 MED ORDER — CHLORHEXIDINE GLUCONATE 0.12 % MT SOLN
15.0000 mL | Freq: Two times a day (BID) | OROMUCOSAL | Status: DC
  Administered 2020-07-17 – 2020-07-24 (×15): 15 mL via OROMUCOSAL
  Filled 2020-07-16 (×8): qty 15

## 2020-07-16 MED ORDER — IPRATROPIUM-ALBUTEROL 0.5-2.5 (3) MG/3ML IN SOLN
3.0000 mL | Freq: Four times a day (QID) | RESPIRATORY_TRACT | Status: DC | PRN
  Administered 2020-07-16: 3 mL via RESPIRATORY_TRACT
  Filled 2020-07-16: qty 3

## 2020-07-16 MED ORDER — DIPHENHYDRAMINE HCL 50 MG/ML IJ SOLN
12.5000 mg | Freq: Four times a day (QID) | INTRAMUSCULAR | Status: DC | PRN
  Administered 2020-07-16 – 2020-07-24 (×2): 25 mg via INTRAVENOUS
  Filled 2020-07-16 (×3): qty 1

## 2020-07-16 NOTE — Progress Notes (Signed)
Advanced pt's NG tube approximately 3cm and ~1200cc of bilious fluid dumped almost immediately. Contacted Dr. Grandville Silos who said this sounded okay and that the tube had not been far enough in.  Also mentioned the patient has expiratory wheezes, Thompson gave VO for duoneb q6h PRN.

## 2020-07-16 NOTE — Progress Notes (Signed)
2 Days Post-Op  Subjective: Extubated yesterday. Last pressor and precedex just turned off.  Pain is well controlled currently.  No flatus.  NGT with 2400cc yesterday.  UOP increasing.  ROS: See above, otherwise other systems negative  Objective: Vital signs in last 24 hours: Temp:  [97.6 F (36.4 C)-99 F (37.2 C)] 98.5 F (36.9 C) (12/09 0800) Pulse Rate:  [69-130] 77 (12/09 0800) Resp:  [15-29] 24 (12/09 0800) BP: (71-122)/(56-90) 96/69 (12/09 0800) SpO2:  [92 %-100 %] 98 % (12/09 0800) Arterial Line BP: (56-149)/(43-80) 118/56 (12/09 0800) FiO2 (%):  [30 %] 30 % (12/08 1530)    Intake/Output from previous day: 12/08 0701 - 12/09 0700 In: 4859 [I.V.:4473; NG/GT:10; IV Piggyback:376] Out: 4190 [Urine:1115; Emesis/NG output:2700; Drains:375] Intake/Output this shift: Total I/O In: 166.5 [I.V.:166.5] Out: 70 [Drains:70]  PE: Gen: NAD, a little sleepy, but appropriate Heart: regular Lungs: CTAB Abd: obese, some distention, absent BS, NGT with some bilious output in the tubing.  Midline wound with VAC in place, JP drain with serosang output. GU: yellow colored urine, 1.1L out yesterday.  Lab Results:  Recent Labs    07/15/20 0510 07/16/20 0503  WBC 12.3* 9.2  HGB 14.3 11.5*  HCT 44.4 36.8*  PLT 215 159   BMET Recent Labs    07/15/20 0510 07/16/20 0503  NA 135 138  K 4.8 4.3  CL 106 106  CO2 18* 21*  GLUCOSE 122* 102*  BUN 34* 36*  CREATININE 2.63* 1.84*  CALCIUM 7.9* 7.8*   PT/INR No results for input(s): LABPROT, INR in the last 72 hours. CMP     Component Value Date/Time   NA 138 07/16/2020 0503   K 4.3 07/16/2020 0503   CL 106 07/16/2020 0503   CO2 21 (L) 07/16/2020 0503   GLUCOSE 102 (H) 07/16/2020 0503   BUN 36 (H) 07/16/2020 0503   CREATININE 1.84 (H) 07/16/2020 0503   CREATININE 1.28 02/22/2016 1506   CALCIUM 7.8 (L) 07/16/2020 0503   PROT 5.0 (L) 07/16/2020 0503   ALBUMIN 2.6 (L) 07/16/2020 0503   AST 35 07/16/2020 0503   ALT 33  07/16/2020 0503   ALKPHOS 28 (L) 07/16/2020 0503   BILITOT 1.4 (H) 07/16/2020 0503   GFRNONAA 42 (L) 07/16/2020 0503   GFRAA 45 (L) 03/18/2019 1123   Lipase     Component Value Date/Time   LIPASE 51 07/15/2020 0510       Studies/Results: DG CHEST PORT 1 VIEW  Result Date: 07/14/2020 CLINICAL DATA:  Intubated EXAM: PORTABLE CHEST 1 VIEW COMPARISON:  07/14/2020, CT 07/14/2020 FINDINGS: Interval intubation, tip of the endotracheal tube is about 2.5 cm superior to the carina. Esophageal tube tip below the diaphragm but incompletely visualized. Right IJ central venous catheter tip over the SVC. Low lung volumes with hazy atelectasis at the bases. Stable enlarged cardiomediastinal silhouette. Resolution of previously noted free air beneath the diaphragms. IMPRESSION: Endotracheal tube tip about 2.5 cm superior to the carina. Placement of esophageal tube and right IJ catheter as above. Low lung volumes with hazy atelectasis at the bases. Cardiomegaly. Electronically Signed   By: Donavan Foil M.D.   On: 07/14/2020 19:14   ECHOCARDIOGRAM COMPLETE  Result Date: 07/15/2020    ECHOCARDIOGRAM REPORT   Patient Name:   Patrick Chapman Date of Exam: 07/15/2020 Medical Rec #:  [id number]      Height:       72.0 in Accession #:    [id number]     Weight:  290.0 lb Date of Birth:  Sep 22, 1960     BSA:          2.495 m Patient Age:    59 years       BP:           123/90 mmHg Patient Gender: M              HR:           109 bpm. Exam Location:  Inpatient Procedure: 2D Echo, Intracardiac Opacification Agent, Cardiac Doppler and Color            Doppler STAT ECHO Indications:    Cardiomyopathy-Unspecified 425.9 / I42.9  History:        Patient has no prior history of Echocardiogram examinations.                 Signs/Symptoms:Hypotension; Risk Factors:Hypertension and Sleep                 Apnea. Abdominal Pain, emergent exploratory laparotomy, wound                 vac to stomach.  Sonographer:    Darlina Sicilian  RDCS Referring Phys: [id number] Frederik Pear  Sonographer Comments: Echo performed with patient supine and on artificial respirator and Technically difficult study due to poor echo windows. IMPRESSIONS  1. Left ventricular ejection fraction, by estimation, is 60 to 65%. The left ventricle has normal function. The left ventricle has no regional wall motion abnormalities. There is mild left ventricular hypertrophy. Left ventricular diastolic parameters are indeterminate.  2. Right ventricular systolic function is normal. The right ventricular size is normal. Tricuspid regurgitation signal is inadequate for assessing PA pressure.  3. The mitral valve is normal in structure. No evidence of mitral valve regurgitation.  4. The aortic valve was not well visualized. Aortic valve regurgitation is not visualized. No aortic stenosis is present. FINDINGS  Left Ventricle: Left ventricular ejection fraction, by estimation, is 60 to 65%. The left ventricle has normal function. The left ventricle has no regional wall motion abnormalities. Definity contrast agent was given IV to delineate the left ventricular  endocardial borders. The left ventricular internal cavity size was normal in size. There is mild left ventricular hypertrophy. Left ventricular diastolic parameters are indeterminate. Right Ventricle: The right ventricular size is normal. Right vetricular wall thickness was not assessed. Right ventricular systolic function is normal. Tricuspid regurgitation signal is inadequate for assessing PA pressure. Left Atrium: Left atrial size was normal in size. Right Atrium: Right atrial size was not well visualized. Pericardium: There is no evidence of pericardial effusion. Mitral Valve: The mitral valve is normal in structure. No evidence of mitral valve regurgitation. Tricuspid Valve: The tricuspid valve is grossly normal. Tricuspid valve regurgitation is not demonstrated. Aortic Valve: The aortic valve was not well visualized.  Aortic valve regurgitation is not visualized. No aortic stenosis is present. Pulmonic Valve: The pulmonic valve was not well visualized. Pulmonic valve regurgitation is not visualized. Aorta: The aortic root is normal in size and structure. IAS/Shunts: The interatrial septum was not well visualized.  LEFT VENTRICLE PLAX 2D LVIDd:         5.30 cm     Diastology LVIDs:         4.40 cm     LV e' medial:    8.05 cm/s LV PW:         1.10 cm     LV E/e' medial:  6.3 LV IVS:  1.10 cm     LV e' lateral:   7.29 cm/s LVOT diam:     1.90 cm     LV E/e' lateral: 7.0 LV SV:         37 LV SV Index:   15 LVOT Area:     2.84 cm  LV Volumes (MOD) LV vol d, MOD A2C: 54.5 ml LV vol d, MOD A4C: 79.1 ml LV vol s, MOD A2C: 27.2 ml LV vol s, MOD A4C: 34.9 ml LV SV MOD A2C:     27.3 ml LV SV MOD A4C:     79.1 ml LV SV MOD BP:      33.8 ml RIGHT VENTRICLE RV S prime:     23.10 cm/s TAPSE (M-mode): 1.8 cm LEFT ATRIUM             Index LA diam:        3.80 cm 1.52 cm/m LA Vol (A2C):   20.9 ml 8.38 ml/m LA Vol (A4C):   23.6 ml 9.46 ml/m LA Biplane Vol: 22.7 ml 9.10 ml/m  AORTIC VALVE LVOT Vmax:   94.70 cm/s LVOT Vmean:  67.400 cm/s LVOT VTI:    0.131 m  AORTA Ao Root diam: 3.30 cm MITRAL VALVE MV Area (PHT): 6.27 cm    SHUNTS MV Decel Time: 121 msec    Systemic VTI:  0.13 m MV E velocity: 50.70 cm/s  Systemic Diam: 1.90 cm MV A velocity: 74.70 cm/s MV E/A ratio:  0.68 Oswaldo Milian MD Electronically signed by Oswaldo Milian MD Signature Date/Time: 07/15/2020/9:53:16 AM    Final     Anti-infectives: Anti-infectives (From admission, onward)   Start     Dose/Rate Route Frequency Ordered Stop   07/14/20 0900  piperacillin-tazobactam (ZOSYN) IVPB 3.375 g  Status:  Discontinued        3.375 g 100 mL/hr over 30 Minutes Intravenous Every 8 hours 07/14/20 0800 07/14/20 0802   07/14/20 0900  piperacillin-tazobactam (ZOSYN) IVPB 3.375 g        3.375 g 12.5 mL/hr over 240 Minutes Intravenous Every 8 hours 07/14/20 0804      07/14/20 0515  ceFAZolin (ANCEF) IVPB 2g/100 mL premix  Status:  Discontinued        2 g 200 mL/hr over 30 Minutes Intravenous  Once 07/14/20 0501 07/14/20 0753   07/14/20 0115  piperacillin-tazobactam (ZOSYN) IVPB 3.375 g        3.375 g 100 mL/hr over 30 Minutes Intravenous  Once 07/14/20 0111 07/14/20 0157       Assessment/Plan HTN - meds on hold due to hypotension Obesity, s/p lap band placement - now s/p removal VDRF - extubated on 12/8 ARF - cr improved to 1.84 today and making good urine.  Hope for this to continue to improve with resuscitation and hydration Septic shock - see below  POD 2, s/p ex lap with removal of lap band for pneumoperitoneum -no obvious source identified.  Few bubbles near his lap band so this was removed -needs UGI, but will hold until he is off precedex and able to tolerate drinking contrast and being moved around etc. -cont NGT, still has post op ileus -cont abx therapy -adjust meds for pain control -will change midline VAC with Maudie Flakes tomorrow morning. -cont JP drain, no evidence of leak at this point -PT/OT eval for mobilization -pulm toilet and IS   FEN - NPO/IVFs/NGT VTE - heparin ID - zosyn   LOS: 2 days    Donald Siva  [facility] 07/16/2020, 8:29 AM Please see Amion for pager number during day hours 7:00am-4:30pm or 7:00am -11:30am on weekends

## 2020-07-16 NOTE — Progress Notes (Signed)
Nutrition Follow-up  DOCUMENTATION CODES:   Obesity unspecified  INTERVENTION:   As diet is able to be advanced will supplement PO diet to better meet nutrition goals.    NUTRITION DIAGNOSIS:   Increased nutrient needs related to post-op healing as evidenced by estimated needs.  GOAL:   Patient will meet greater than or equal to 90% of their needs  MONITOR:   Diet advancement,I & O's  REASON FOR ASSESSMENT:   Rounds    ASSESSMENT:   Pt with PMH of GERD, HTN, obesity, sleep apnea, lap band 2009 and previous lap chole admitted with pneumoperitoneum.    Pt discussed during ICU rounds and with RN.   12/7 s/p ex lap, removal of lap band and components, application of wound VAC 12/8 NGT drained 2400 ml; extubated   Medications reviewed and include: SSI Labs reviewed   87 F NG tube: 2700 ml x 24 hr JP: 325 ml  VAC: 50 ml    NUTRITION - FOCUSED PHYSICAL EXAM:  Flowsheet Row Most Recent Value  Orbital Region No depletion  Upper Arm Region No depletion  Thoracic and Lumbar Region No depletion  Buccal Region No depletion  Temple Region No depletion  Clavicle Bone Region No depletion  Clavicle and Acromion Bone Region No depletion  Scapular Bone Region No depletion  Dorsal Hand No depletion  Patellar Region No depletion  Anterior Thigh Region No depletion  Posterior Calf Region No depletion  Edema (RD Assessment) Mild  Hair Reviewed  Eyes Reviewed  Mouth Unable to assess  Skin Reviewed  Nails Reviewed       Diet Order:   Diet Order            Diet NPO time specified  Diet effective now                 EDUCATION NEEDS:   No education needs have been identified at this time  Skin:  Skin Assessment:  (incision with VAC)  Last BM:  unknown  Height:   Ht Readings from Last 1 Encounters:  07/14/20 6' (1.829 m)    Weight:   Wt Readings from Last 1 Encounters:  07/14/20 131.5 kg    Ideal Body Weight:  80.9 kg  BMI:  Body mass index  is 39.33 kg/m.  Estimated Nutritional Needs:   Kcal:  2200-2500  Protein:  125-145 grams  Fluid:  >2 L/day  Lockie Pares., RD, LDN, CNSC See AMiON for contact information

## 2020-07-16 NOTE — Progress Notes (Signed)
Patient's wife and daughter called for an update. Claiborne Billings PA from [place] looked over Echo results and gave me the ok to tell them that it looked fine.   Updated family on dc of precedex and neo along with improving kidney function and plan to work with PT/OT.  After updating, I held the phone to the patient's ear and allowed them to communicate with him.

## 2020-07-16 NOTE — Progress Notes (Addendum)
NAME:  Patrick Chapman, MRN:  [id number], DOB:  [birth year], LOS: 2 ADMISSION DATE:  07/14/2020, CONSULTATION DATE:  07/14/2020 REFERRING MD: Donne Hazel, CHIEF COMPLAINT:  Pneumoperitoneum, vent management post op   Brief History   59 year old male former smoker ( Quit  1988 with a 10 pack year smoking history) admitted 12/7 for pneumoperitoneum , Exp lap and lap band removal by Dr. Donne Hazel. PCCM have been consulted for vent management post op.  History of present illness   59 year old male with history in  2009 of lap band and prior lap chole presents 12/7 early am with significant upper abdominal pain that started same day  and has progressed.  He has been vomiting.  No change in bms, no fevers.  Progressive ,constant pain and abdominal distension.   He had ct/cxr at [facility] with finding of pneumoperitoneum and was transferred to [facility] Taken to OR 12/7  by Dr. Donne Hazel urgently for Exp. Lap., lap band removal , placement of 19 French Blake Drain in epigastrium and wound vac placement.PCCM have been consulted by surgery for vent management post op.  Past Medical History  Sleep apnea Obesity HTN GERD   Significant Hospital Events   07/14/2020 > Admission  Consults:  12/7 PCCM  Procedures:  09/15/2019>> Exp Lap, removal of Lap Band ( Dr. Donne Hazel)  Significant Diagnostic Tests:   07/14/2020 CT Abdomen and Pelvis  With contrast > Probable focal wall thickening inflammatory changes seen around the gastroduodenal junction and mid duodenum be due to perforated ulcer or duodenitis. Also findings of acute pancreatitis involving the pancreatic head. No evidence of pancreatic necrosis or loculated fluidcollections.Extensive portal venous gas and findings suggestive pneumatosiswithin the proximal small bowel.Large amount of pneumoperitoneum  CXR 12/7 > Endotracheal tube tip about 2.5 cm superior to the carina. Placement of esophageal tube and right IJ catheter as above. Low lung volumes with hazy  atelectasis at the bases. Cardiomegaly.  Micro Data:  SARS Coronavirus 12/7  > Negative MRSA PCR Negative 12/7 > Negative  Blood culture 12/7 >  Antimicrobials:  Zosyn 07/14/2020>  Interim history/subjective:  Extubated yesterday   Continues on Dexmet @ 0.4 and neo @ 60-70 ICDSC score consistently 1 -- unclear why pt remains on dexmet.    Cr downtrending  UOP 0.4-0.49ml/kg/hr  Seen with CCS at bedside   Objective   Blood pressure 114/77, pulse 71, temperature 99 F (37.2 C), temperature source Oral, resp. rate (!) 23, height 6' (1.829 m), weight 131.5 kg, SpO2 98 %.    Vent Mode: CPAP;PSV FiO2 (%):  [30 %] 30 % PEEP:  [5 cmH20] 5 cmH20 Pressure Support:  [5 cmH20-12 cmH20] 5 cmH20   Intake/Output Summary (Last 24 hours) at 07/16/2020 0734 Last data filed at 07/16/2020 0700 Gross per 24 hour  Intake 4859.04 ml  Output 4145 ml  Net 714.04 ml   Filed Weights   07/14/20 0033  Weight: 131.5 kg    Examination: General: Obese middle aged M, reclined in bed NAD  HEENT: NCAT NGT secure. Pink mmm anicteric  Sclera  Neuro: AAOx3 following commands. PERRLA CV: rrr s1s2 no rgm cap refill brisk  PULM:  CTA symmetrical chest expansion no accessory use  GI: Midline wound vac in place. + bowel sounds. Round abdomen  Extremities: no obviou joint deformity no cyanosis or clubbing. No edema Skin: c/d/w no rash   Resolved Hospital Problem list    Acute postoperative respiratory failure requiring intubation  Assessment & Plan:  Acute postoperative  respiratory insufficiency post  EXP Lap P: -continue to wean Supplemental O2 -IS, mobility, PT/OT   Hypotension, improved  -likely multifactorial; sedation related (precedex) and recent surgery P: -dc both precedex and neo -SBP goal > 90, MAP goal > 65  -if remains off pressors this morning, dc arterial line   Pneumoperitoneum s/p ex lap with removal of lap band  Post op ileus  ? Pancreatitis per CT -Lipase also elevated at  1,068 on admission, this quickly improved post IV hydration and surgery  P: -per CCS  -needs contrast swallow study when more awake -cont NGT to low intermittent suction  -cont JP drain  -cont zosyn for intra abdominal infection  -will have wound vac change 12/10   Hypokalemia Hypomagnesia  P: -replace as indicated -continue to trend   Acute Kidney Injury, improving  -Baseline creatinine 1.3-1.8 creatinine on admission 1.24 with spike of 2.63 post surgery  P: -trend renal indices and UOP -mIVF 50 ml/hr -- if UOP declines can increase but at present is significantly net +, ok to leave at 25ml/hr for now  Difficulty sleeping -not yet able to take PO P: -dc precedex.  -PRN benadryl  -when able to take POs, consider melatonin    Discussed with primary team. PCCM will sign off. Please re-engage if we can be of further assistance or if patient's clinical status changes.   Best practice (evaluated daily)  Diet: NPO: per primary team Pain/Anxiety/Delirium protocol (if indicated): PRN morphine, APAP VAP protocol (if indicated): ordered DVT prophylaxis: SQ Heparin GI prophylaxis: Protonix Glucose control: CBG with SSI Mobility: PT/OT  last date of multidisciplinary goals of care discussion: NA -- CCS primary  Family and staff present NA Summary of discussion NA Follow up goals of care discussion due Code Status: Full Disposition: ICU. If remains off pressors, may be approaching readiness for transfer out of ICU   Labs   CBC: Recent Labs  Lab 07/14/20 0101 07/14/20 0957 07/14/20 1420 07/14/20 2009 07/15/20 0307 07/15/20 0508 07/15/20 0510 07/16/20 0503  WBC 13.8*  --  6.4  --   --   --  12.3* 9.2  HGB 15.8   < > 14.7 14.7 13.9 13.9 14.3 11.5*  HCT 50.3   < > 45.6 43.1 41.0 41.0 44.4 36.8*  MCV 99.0  --  94.4  --   --   --  94.7 97.9  PLT 229  --  223  --   --   --  215 159   < > = values in this interval not displayed.    Basic Metabolic Panel: Recent Labs   Lab 07/14/20 0101 07/14/20 0957 07/14/20 1112 07/15/20 0307 07/15/20 0508 07/15/20 0510 07/16/20 0503  NA 139 140  --  136 137 135 138  K 3.3* 4.8  --  4.6 4.6 4.8 4.3  CL 104  --   --   --   --  106 106  CO2 25  --   --   --   --  18* 21*  GLUCOSE 237*  --   --   --   --  122* 102*  BUN 17  --   --   --   --  34* 36*  CREATININE 1.24  --   --   --   --  2.63* 1.84*  CALCIUM 9.1  --   --   --   --  7.9* 7.8*  MG  --   --  1.4*  --   --  1.5* 2.1  PHOS  --   --   --   --   --   --  3.2   GFR: Estimated Creatinine Clearance: 61.4 mL/min (A) (by C-G formula based on SCr of 1.84 mg/dL (H)). Recent Labs  Lab 07/14/20 0101 07/14/20 0936 07/14/20 1420 07/15/20 0510 07/16/20 0503  WBC 13.8*  --  6.4 12.3* 9.2  LATICACIDVEN  --  4.2* 3.5* 2.2*  --     Liver Function Tests: Recent Labs  Lab 07/14/20 0101 07/15/20 0510 07/16/20 0503  AST 24 41 35  ALT 22 41 33  ALKPHOS 53 26* 28*  BILITOT 0.5 3.1* 1.4*  PROT 8.1 5.1* 5.0*  ALBUMIN 4.5 2.6* 2.6*   Recent Labs  Lab 07/14/20 0101 07/15/20 0510  LIPASE 1,068* 51   No results for input(s): AMMONIA in the last 168 hours.  ABG    Component Value Date/Time   PHART 7.400 07/15/2020 0508   PCO2ART 33.4 07/15/2020 0508   PO2ART 84 07/15/2020 0508   HCO3 20.7 07/15/2020 0508   TCO2 22 07/15/2020 0508   ACIDBASEDEF 3.0 (H) 07/15/2020 0508   O2SAT 96.0 07/15/2020 0508     Coagulation Profile: No results for input(s): INR, PROTIME in the last 168 hours.  Cardiac Enzymes: No results for input(s): CKTOTAL, CKMB, CKMBINDEX, TROPONINI in the last 168 hours.  HbA1C: Hgb A1c MFr Bld  Date/Time Value Ref Range Status  07/14/2020 11:12 AM 5.7 (H) 4.8 - 5.6 % Final    Comment:    (NOTE) Pre diabetes:          5.7%-6.4%  Diabetes:              >6.4%  Glycemic control for   <7.0% adults with diabetes   04/26/2018 10:13 AM 6.0 (H) 4.8 - 5.6 % Final    Comment:    (NOTE) Pre diabetes:          5.7%-6.4% Diabetes:               >6.4% Glycemic control for   <7.0% adults with diabetes     CBG: Recent Labs  Lab 07/15/20 1214 07/15/20 1632 07/15/20 1954 07/15/20 2344 07/16/20 0400  GLUCAP 123* 97 79 98 101*      Critical care time:    CRITICAL CARE Performed by: Cristal Generous   Total critical care time: 43 minutes  Critical care time was exclusive of separately billable procedures and treating other patients. Critical care was necessary to treat or prevent imminent or life-threatening deterioration.  Critical care was time spent personally by me on the following activities: development of treatment plan with patient and/or surrogate as well as nursing, discussions with consultants, evaluation of patient's response to treatment, examination of patient, obtaining history from patient or surrogate, ordering and performing treatments and interventions, ordering and review of laboratory studies, ordering and review of radiographic studies, pulse oximetry and re-evaluation of patient's condition.   Eliseo Gum MSN, AGACNP-BC [employer] [phone] If no answer, [phone] 07/16/2020, 7:34 AM

## 2020-07-17 ENCOUNTER — Inpatient Hospital Stay (HOSPITAL_COMMUNITY): Payer: PRIVATE HEALTH INSURANCE

## 2020-07-17 DIAGNOSIS — N179 Acute kidney failure, unspecified: Secondary | ICD-10-CM | POA: Diagnosis not present

## 2020-07-17 DIAGNOSIS — R000 Tachycardia, unspecified: Secondary | ICD-10-CM | POA: Diagnosis not present

## 2020-07-17 LAB — CBC
HCT: 33.8 % — ABNORMAL LOW (ref 39.0–52.0)
Hemoglobin: 10.5 g/dL — ABNORMAL LOW (ref 13.0–17.0)
MCH: 30.5 pg (ref 26.0–34.0)
MCHC: 31.1 g/dL (ref 30.0–36.0)
MCV: 98.3 fL (ref 80.0–100.0)
Platelets: 131 10*3/uL — ABNORMAL LOW (ref 150–400)
RBC: 3.44 MIL/uL — ABNORMAL LOW (ref 4.22–5.81)
RDW: 14.5 % (ref 11.5–15.5)
WBC: 8 10*3/uL (ref 4.0–10.5)
nRBC: 0 % (ref 0.0–0.2)

## 2020-07-17 LAB — BASIC METABOLIC PANEL
Anion gap: 11 (ref 5–15)
BUN: 22 mg/dL — ABNORMAL HIGH (ref 6–20)
CO2: 23 mmol/L (ref 22–32)
Calcium: 7.7 mg/dL — ABNORMAL LOW (ref 8.9–10.3)
Chloride: 103 mmol/L (ref 98–111)
Creatinine, Ser: 1.28 mg/dL — ABNORMAL HIGH (ref 0.61–1.24)
GFR, Estimated: >60 mL/min (ref >60)
Glucose, Bld: 107 mg/dL — ABNORMAL HIGH (ref 70–99)
Potassium: 4 mmol/L (ref 3.5–5.1)
Sodium: 137 mmol/L (ref 135–145)

## 2020-07-17 LAB — TSH: TSH: 11.164 u[IU]/mL — ABNORMAL HIGH (ref 0.350–4.500)

## 2020-07-17 LAB — BRAIN NATRIURETIC PEPTIDE: B Natriuretic Peptide: 112.2 pg/mL — ABNORMAL HIGH (ref 0.0–100.0)

## 2020-07-17 LAB — GLUCOSE, CAPILLARY
Glucose-Capillary: 73 mg/dL (ref 70–99)
Glucose-Capillary: 77 mg/dL (ref 70–99)
Glucose-Capillary: 77 mg/dL (ref 70–99)
Glucose-Capillary: 78 mg/dL (ref 70–99)
Glucose-Capillary: 82 mg/dL (ref 70–99)
Glucose-Capillary: 90 mg/dL (ref 70–99)

## 2020-07-17 LAB — LACTIC ACID, PLASMA: Lactic Acid, Venous: 0.8 mmol/L (ref 0.5–1.9)

## 2020-07-17 MED ORDER — LACTATED RINGERS IV BOLUS
1000.0000 mL | Freq: Once | INTRAVENOUS | Status: AC
  Administered 2020-07-17: 1000 mL via INTRAVENOUS

## 2020-07-17 MED ORDER — LACTATED RINGERS IV BOLUS
500.0000 mL | Freq: Once | INTRAVENOUS | Status: AC
  Administered 2020-07-17: 500 mL via INTRAVENOUS

## 2020-07-17 MED ORDER — METOPROLOL TARTRATE 5 MG/5ML IV SOLN
5.0000 mg | Freq: Once | INTRAVENOUS | Status: AC
  Administered 2020-07-17: 5 mg via INTRAVENOUS
  Filled 2020-07-17: qty 5

## 2020-07-17 MED ORDER — METOPROLOL TARTRATE 5 MG/5ML IV SOLN
2.5000 mg | INTRAVENOUS | Status: DC | PRN
  Administered 2020-07-17 – 2020-07-18 (×4): 2.5 mg via INTRAVENOUS
  Filled 2020-07-17 (×4): qty 5

## 2020-07-17 MED ORDER — ACETAMINOPHEN 10 MG/ML IV SOLN
1000.0000 mg | Freq: Four times a day (QID) | INTRAVENOUS | Status: AC
  Administered 2020-07-17 – 2020-07-18 (×3): 1000 mg via INTRAVENOUS
  Filled 2020-07-17 (×4): qty 100

## 2020-07-17 MED ORDER — IOHEXOL 350 MG/ML SOLN
60.0000 mL | Freq: Once | INTRAVENOUS | Status: AC | PRN
  Administered 2020-07-17: 60 mL via INTRAVENOUS

## 2020-07-17 MED ORDER — METOPROLOL TARTRATE 5 MG/5ML IV SOLN
2.5000 mg | Freq: Once | INTRAVENOUS | Status: AC
  Administered 2020-07-17: 2.5 mg via INTRAVENOUS
  Filled 2020-07-17: qty 5

## 2020-07-17 MED ORDER — METOPROLOL TARTRATE 5 MG/5ML IV SOLN
10.0000 mg | Freq: Four times a day (QID) | INTRAVENOUS | Status: DC
  Administered 2020-07-17: 7.5 mg via INTRAVENOUS
  Administered 2020-07-17 – 2020-07-21 (×15): 10 mg via INTRAVENOUS
  Filled 2020-07-17 (×16): qty 10

## 2020-07-17 MED ORDER — METOPROLOL TARTRATE 5 MG/5ML IV SOLN
5.0000 mg | Freq: Four times a day (QID) | INTRAVENOUS | Status: DC
  Administered 2020-07-17: 5 mg via INTRAVENOUS
  Filled 2020-07-17: qty 5

## 2020-07-17 MED ORDER — METOPROLOL TARTRATE 5 MG/5ML IV SOLN
2.5000 mg | Freq: Four times a day (QID) | INTRAVENOUS | Status: DC | PRN
  Administered 2020-07-17: 5 mg via INTRAVENOUS
  Filled 2020-07-17: qty 5

## 2020-07-17 MED ORDER — ALBUMIN HUMAN 5 % IV SOLN
12.5000 g | Freq: Once | INTRAVENOUS | Status: AC
  Administered 2020-07-17: 12.5 g via INTRAVENOUS
  Filled 2020-07-17: qty 250

## 2020-07-17 NOTE — Progress Notes (Signed)
Notified Elink that pt became tachycardic 140-160's, BP sys 149; and pt denies pain other than surgical pain which was recently treated with morphine; also pt states he takes lopressor at home; awaiting orders

## 2020-07-17 NOTE — Progress Notes (Signed)
HR remains elevated in 130s-140s this AM; orders changed per Wilmer Floor PA, see MAR.    1230 notified K. Maxwell Caul PA of continued elevated HR in 140s after scheduled metoprolol 5mg ; dose increased to 10mg  and added prn 2.5mg  metoprolol q3h (prn given).    1400 HR remains elevated in 140s, Wilmer Floor PA notified, continue to monitor at this time as new 10mg  metoprolol dose has not started; no new orders placed.

## 2020-07-17 NOTE — Consult Note (Signed)
WOC Nurse Consult Note: Discussed with Wilmer Floor, CCS PA who has agreed to transition NPWT dressing changes from [facility] Nurse to Bedside RN.  Bedside RN to change Midline NPWT dressing every M/W/F beginning on Monday, 12/13.21.  [place] nursing team will not follow, but will remain available to this patient, the nursing and medical teams.  Please re-consult if needed. Thanks, Maudie Flakes, MSN, RN, [place], Arther Abbott  Pager# [phone]

## 2020-07-17 NOTE — Progress Notes (Addendum)
[place] Progress Note Patient Name: Patrick Chapman DOB: 12-May-1961 MRN: [id number]   Date of Service  07/17/2020  HPI/Events of Note  EKG shows sinus tachycardia with a rate of  146. No calf swelling or tenderness, no  Shortness of breath or chest pain.  eICU Interventions  LR fluid bolus 500 ml ordered. Check BNP.        [name] Tasman Zapata 07/17/2020, 2:39 AM

## 2020-07-17 NOTE — Progress Notes (Signed)
[place] Progress Note Patient Name: Patrick Chapman DOB: [birth year] MRN: [id number]   Date of Service  07/17/2020  HPI/Events of Note  CTA chest reviewed.  eICU Interventions  Negative for PE or pulmonary edema, PRN iv Lopressor ordered. Patient is not complaining of pain to possibly account for the tachycardia.        [name] 07/17/2020, 7:06 AM

## 2020-07-17 NOTE — Progress Notes (Signed)
[place] Progress Note Patient Name: Patrick Chapman DOB: 07/03/1961 MRN: [id number]   Date of Service  07/17/2020  HPI/Events of Note  Patient with narrow complex tachycardia with a rate of 146 to 153, hemodynamically stable.  eICU Interventions  Stat 12 lead EKG ordered to evaluate the rhythm.        [name] Terryl Niziolek 07/17/2020, 2:18 AM

## 2020-07-17 NOTE — Evaluation (Signed)
Occupational Therapy Evaluation Patient Details Name: Patrick Chapman MRN: [id number] DOB: 09-10-1960 Today's Date: 07/17/2020    History of Present Illness The pt is a 59 yo male presenting with severe left upper quadrant pain, found to have pneumoperitoneum. S/p ex lap and removal of lap-band on 12/07. Extubated 12/8. PMH includes: obesity, HTN, GERD, sleep apnea, with lap band in 2009.   Clinical Impression   Pt admitted with above. He demonstrates the below listed deficits and will benefit from continued OT to maximize safety and independence with BADLs.  Pt presents to OT with generalized weakness, increased pain, decreased activity tolerance.  He currently requires set up assist - total A for ADLs and min A +2 for functional transfers.  He reports he lives at home with his wife, who works, and he was fully independent PTA, including working full time.  Will follow acutely.    HR 143-155; HR 143-155; BP supine 144/81; sitting 128/89; after transfer to chair 112/90        Follow Up Recommendations  No OT follow up;Supervision/Assistance - 24 hour    Equipment Recommendations  Tub/shower seat    Recommendations for Other Services       Precautions / Restrictions Precautions Precautions: Fall Restrictions Weight Bearing Restrictions: No      Mobility Bed Mobility Overal bed mobility: Needs Assistance Bed Mobility: Supine to Sit     Supine to sit: Mod assist;+2 for physical assistance     General bed mobility comments: assist to lift trunk    Transfers Overall transfer level: Needs assistance Equipment used: 2 person hand held assist Transfers: Sit to/from Stand;Stand Pivot Transfers Sit to Stand: Min assist;+2 physical assistance;+2 safety/equipment Stand pivot transfers: Min assist;+2 safety/equipment       General transfer comment: verbal cues for technique and sequencing and assist to steady    Balance Overall balance assessment: Needs  assistance Sitting-balance support: Single extremity supported;Feet supported Sitting balance-Leahy Scale: Poor Sitting balance - Comments: requires UE support Postural control: Posterior lean Standing balance support: Bilateral upper extremity supported Standing balance-Leahy Scale: Poor Standing balance comment: requires UE support                           ADL either performed or assessed with clinical judgement   ADL Overall ADL's : Needs assistance/impaired Eating/Feeding: NPO Eating/Feeding Details (indicate cue type and reason): able to independently feed self ice chips Grooming: Wash/dry face;Wash/dry hands;Oral care;Set up;Sitting   Upper Body Bathing: Minimal assistance;Sitting   Lower Body Bathing: Maximal assistance;Sit to/from stand   Upper Body Dressing : Sitting;Moderate assistance   Lower Body Dressing: Total assistance;Sit to/from stand   Toilet Transfer: Minimal assistance;+2 for physical assistance;+2 for safety/equipment;Stand-pivot;BSC   Toileting- Clothing Manipulation and Hygiene: Total assistance;Sit to/from stand       Functional mobility during ADLs: Minimal assistance;+2 for physical assistance;+2 for safety/equipment       Vision Baseline Vision/History:  (wears contacts) Patient Visual Report: No change from baseline       Perception     Praxis      Pertinent Vitals/Pain Pain Assessment: Faces Faces Pain Scale: Hurts little more Pain Location: abdomen Pain Descriptors / Indicators: Operative site guarding;Grimacing;Guarding Pain Intervention(s): Monitored during session;Limited activity within patient's tolerance     Hand Dominance Right   Extremity/Trunk Assessment Upper Extremity Assessment Upper Extremity Assessment: Generalized weakness   Lower Extremity Assessment Lower Extremity Assessment: Defer to PT evaluation   Cervical /  Trunk Assessment Cervical / Trunk Assessment: Other exceptions Cervical / Trunk  Exceptions: obesity and abdominal wound with wound VAC   Communication Communication Communication: No difficulties   Cognition Arousal/Alertness: Awake/alert Behavior During Therapy: WFL for tasks assessed/performed;Anxious Overall Cognitive Status: Impaired/Different from baseline                                 General Comments: Pt reports he feels a little "cob webby".   Slight difficulty recalling grandchildren and daughters info   General Comments  HR 143-155; BP supine 144/81; sitting 128/89; after transfer to chair 112/90    Exercises     Shoulder Instructions      Home Living Family/patient expects to be discharged to:: Private residence Living Arrangements: Spouse/significant other Available Help at Discharge: Family;Available PRN/intermittently Type of Home: House Home Access: Stairs to enter [employer] of Steps: 3 in front, 2 on the side Entrance Stairs-Rails: None Home Layout: One level     Bathroom Shower/Tub: Tub/shower unit;Door   [employer] Toilet: Standard     Home Equipment: None   Additional Comments: Lives with wife, who just started a new job so unsure of how much assist she will be able to provide.  He has 3 adult daughters      Prior Functioning/Environment Level of Independence: Independent        Comments: works remotely as a Marketing executive Problem List: Decreased strength;Decreased activity tolerance;Impaired balance (sitting and/or standing);Decreased cognition;Decreased safety awareness;Decreased knowledge of precautions;Cardiopulmonary status limiting activity;Obesity;Pain      OT Treatment/Interventions: Self-care/ADL training;DME and/or AE instruction;Energy conservation;Therapeutic activities;Patient/family education;Balance training    OT Goals(Current goals can be found in the care plan section) Acute Rehab OT Goals Patient Stated Goal: to get the NG tube out OT Goal Formulation: With  patient Time For Goal Achievement: 07/31/20 Potential to Achieve Goals: Good ADL Goals Pt Will Perform Grooming: with min guard assist;standing Pt Will Perform Upper Body Bathing: with set-up;with supervision;sitting Pt Will Perform Lower Body Bathing: with min guard assist;with adaptive equipment;sit to/from stand Pt Will Perform Upper Body Dressing: with set-up;with supervision;sitting Pt Will Perform Lower Body Dressing: with min guard assist;with adaptive equipment;sit to/from stand Pt Will Transfer to Toilet: with min guard assist;ambulating;regular height toilet;grab bars Pt Will Perform Toileting - Clothing Manipulation and hygiene: with min guard assist;sit to/from stand Pt Will Perform Tub/Shower Transfer: with min guard assist;ambulating;shower seat  OT Frequency: Min 2X/week   Barriers to D/C:            Co-evaluation PT/OT/SLP Co-Evaluation/Treatment: Yes Reason for Co-Treatment: For patient/therapist safety   OT goals addressed during session: ADL's and self-care      AM-PAC OT "6 Clicks" Daily Activity     Outcome Measure Help from another person eating meals?: None Help from another person taking care of personal grooming?: A Little Help from another person toileting, which includes using toliet, bedpan, or urinal?: A Lot Help from another person bathing (including washing, rinsing, drying)?: A Lot Help from another person to put on and taking off regular upper body clothing?: A Lot Help from another person to put on and taking off regular lower body clothing?: Total 6 Click Score: 14   End of Session Nurse Communication: Mobility status  Activity Tolerance: Patient limited by fatigue;Patient limited by pain;Treatment limited secondary to medical complications (Comment) (elevated HR, decreased BP and NG to suction) Patient  left: in chair;with call bell/phone within reach  OT Visit Diagnosis: Unsteadiness on feet (R26.81);Pain Pain - Right/Left:  (abdomen)                 Time: 5183-3582 OT Time Calculation (min): 36 min Charges:  OT General Charges $OT Visit: 1 Visit OT Evaluation $OT Eval Moderate Complexity: 1 Mod  Nilsa Nutting., OTR/L Acute Rehabilitation Services Pager [phone] Office [phone]   Lucille Passy M 07/17/2020, 10:44 AM

## 2020-07-17 NOTE — Anesthesia Postprocedure Evaluation (Signed)
Anesthesia Post Note  Patient: Patrick Chapman  Procedure(s) Performed: EXPLORATORY LAPAROTOMY WITH REMOVAL OF LAP BAND AND COMPONENTS (N/A Abdomen) APPLICATION OF WOUND VAC (N/A Abdomen)     Patient location during evaluation: PACU Anesthesia Type: General Level of consciousness: awake and alert Pain management: pain level controlled Vital Signs Assessment: post-procedure vital signs reviewed and stable Respiratory status: spontaneous breathing, nonlabored ventilation, respiratory function stable and patient connected to nasal cannula oxygen Cardiovascular status: blood pressure returned to baseline and stable Postop Assessment: no apparent nausea or vomiting Anesthetic complications: no   No complications documented.            Effie Berkshire

## 2020-07-17 NOTE — Consult Note (Signed)
[place] Nurse Consult Note: Reason for Consult: First surgical NPWT dressing change.  I am assisted by CCS PA K. Osborne today. She has taken a photograph and uploaded it to the patient's EMR. Wound type: Surgical Pressure Injury POA: N/A Measurement: 28cm x 5cm x 4cm  Wound bed: 100% red, moist Drainage (amount, consistency, odor) small amount of serosanguinous exudate in cannister.  Periwound: intact with maceration noted from 5-6 o'clock where foam has been in contact with skin. Today I protect the distal margin of the incision and the right side of the abdomen (9 o'clock) with skin barrier ring to confine Granufoam to wound bed Dressing procedure/placement/frequency: Patient is premedicated by his bedside RN, Jarrett Soho with 2mg  morphine prior to dressing change and tolerates the procedure well. He asks K. Maxwell Caul about why the wound was not closed or approximated intraoperatively and Ms. Maxwell Caul explains the rationale for leaving a dirty wound open to heal by secondary intention. She also informs him of the benefits of NPWT and how it will aid the wound repair and regeneration process. One piece of black foam removed and one piece of black foam inserted to obliterate dead space. Dressing is attached to 164mmHg continuous negative pressure and an immediate seal is achieved.  Next dressing change is due on Monday, December 13. One of my partners will perform in my absence. Eventually, this dressing may be transitioned to the Bedside RN staff as it is uncomplicated and the wound bed is free of nonviable tissue.  [place] nursing team will follow, and will remain available to this patient, the nursing and medical teams.  Thanks, Maudie Flakes, MSN, RN, [place], Arther Abbott  Pager# [phone]

## 2020-07-17 NOTE — Progress Notes (Addendum)
eLink Physician-Brief Progress Note Patient Name: Patrick Chapman DOB: 22-May-1961 MRN: [id number]   Date of Service  07/17/2020  HPI/Events of Note  TSH 11.12, BNP 112.  eICU Interventions  Albumin 5 % 250 ml iv x 1, CTA chest to r/o PE ordered. Stat serum lactic acid.        [name] [name] 07/17/2020, 4:56 AM

## 2020-07-17 NOTE — Progress Notes (Signed)
EKG order completed and transmitted; awaiting further orders

## 2020-07-17 NOTE — Progress Notes (Signed)
3 Days Post-Op  Subjective: Had tachycardia overnight.  Black box ordered TSH which is 11.  Also ordered CTA chest which is negative for PE.  Lopressor given with minimal response.  Takes scheduled metoprolol at home which has been on hold here.  Had over 1200cc out of NGT last night once manipulated.  Passed some flatus.  Denies CP, SOB, can't feels HR, no other complaints  ROS: See above, otherwise other systems negative  Objective: Vital signs in last 24 hours: Temp:  [97.6 F (36.4 C)-98.1 F (36.7 C)] 98 F (36.7 C) (12/10 0400) Pulse Rate:  [87-161] 137 (12/10 0730) Resp:  [19-36] 36 (12/10 0730) BP: (84-177)/(63-112) 154/87 (12/10 0730) SpO2:  [95 %-100 %] 100 % (12/10 0730) Arterial Line BP: (90-156)/(47-76) 156/76 (12/09 1500) FiO2 (%):  [28 %] 28 % (12/09 1727)    Intake/Output from previous day: 12/09 0701 - 12/10 0700 In: 3144.4 [I.V.:1331.9; IV Piggyback:1812.6] Out: 6712 [Urine:1675; Emesis/NG output:1900; Drains:385] Intake/Output this shift: Total I/O In: -  Out: 550 [Urine:500; Emesis/NG output:50]  PE: Gen: NAD Heart: regular, but tachy Lungs; CTA Abd: soft, obese, NGT with bilious output, midline wound is clean and VAC replaced.     Lab Results:  Recent Labs    07/16/20 0503 07/17/20 0310  WBC 9.2 8.0  HGB 11.5* 10.5*  HCT 36.8* 33.8*  PLT 159 131*   BMET Recent Labs    07/16/20 0503 07/17/20 0310  NA 138 137  K 4.3 4.0  CL 106 103  CO2 21* 23  GLUCOSE 102* 107*  BUN 36* 22*  CREATININE 1.84* 1.28*  CALCIUM 7.8* 7.7*   PT/INR No results for input(s): LABPROT, INR in the last 72 hours. CMP     Component Value Date/Time   NA 137 07/17/2020 0310   K 4.0 07/17/2020 0310   CL 103 07/17/2020 0310   CO2 23 07/17/2020 0310   GLUCOSE 107 (H) 07/17/2020 0310   BUN 22 (H) 07/17/2020 0310   CREATININE 1.28 (H) 07/17/2020 0310   CREATININE 1.28 02/22/2016 1506   CALCIUM 7.7 (L) 07/17/2020 0310   PROT 5.0 (L) 07/16/2020 0503    ALBUMIN 2.6 (L) 07/16/2020 0503   AST 35 07/16/2020 0503   ALT 33 07/16/2020 0503   ALKPHOS 28 (L) 07/16/2020 0503   BILITOT 1.4 (H) 07/16/2020 0503   GFRNONAA >60 07/17/2020 0310   GFRAA 45 (L) 03/18/2019 1123   Lipase     Component Value Date/Time   LIPASE 51 07/15/2020 0510       Studies/Results: CT ANGIO CHEST PE W OR WO CONTRAST  Result Date: 07/17/2020 CLINICAL DATA:  Chest pain and shortness of breath. EXAM: CT ANGIOGRAPHY CHEST WITH CONTRAST TECHNIQUE: Multidetector CT imaging of the chest was performed using the standard protocol during bolus administration of intravenous contrast. Multiplanar CT image reconstructions and MIPs were obtained to evaluate the vascular anatomy. CONTRAST:  73mL OMNIPAQUE IOHEXOL 350 MG/ML SOLN COMPARISON:  None. FINDINGS: Examination is body habitus breathing motion. Cardiovascular: The heart is borderline enlarged. No pericardial effusion. The aorta is normal in caliber. No dissection. Scattered atherosclerotic calcifications at the aortic arch. Scattered coronary artery calcifications. The pulmonary arterial trunk and right and left main pulmonary arteries are enlarged suggesting pulmonary hypertension. No definite filling defects to suggest pulmonary embolism but study is limited, particularly at the lung bases. Mediastinum/Nodes: No mediastinal or hilar mass or lymphadenopathy. There is an NG tube coursing down esophagus and into the stomach. Lungs/Pleura: Limited evaluation due  to breathing motion artifact but no pulmonary edema or worrisome pulmonary lesions. There are small bilateral pleural effusions and areas of atelectasis. Upper Abdomen: Marked inflammation of and around the pancreas consistent with prior CT scan showing acute pancreatitis. Musculoskeletal: No chest wall mass, supraclavicular or axillary adenopathy. Thyroid gland is unremarkable. The bony structures are intact. Review of the MIP images confirms the above findings. IMPRESSION: 1.  Limited examination due to body habitus and breathing motion artifact. 2. No definite CT findings for pulmonary embolism. 3. Normal caliber thoracic aorta. 4. Enlarged pulmonary arterial trunk and right and left main pulmonary arteries suggesting pulmonary hypertension. 5. Small bilateral pleural effusions and areas of atelectasis. 6. Acute pancreatitis. 7. Aortic atherosclerosis. Aortic Atherosclerosis (ICD10-I70.0). Electronically Signed   By: Marijo Sanes M.D.   On: 07/17/2020 06:42    Anti-infectives: Anti-infectives (From admission, onward)   Start     Dose/Rate Route Frequency Ordered Stop   07/14/20 0900  piperacillin-tazobactam (ZOSYN) IVPB 3.375 g  Status:  Discontinued        3.375 g 100 mL/hr over 30 Minutes Intravenous Every 8 hours 07/14/20 0800 07/14/20 0802   07/14/20 0900  piperacillin-tazobactam (ZOSYN) IVPB 3.375 g        3.375 g 12.5 mL/hr over 240 Minutes Intravenous Every 8 hours 07/14/20 0804     07/14/20 0515  ceFAZolin (ANCEF) IVPB 2g/100 mL premix  Status:  Discontinued        2 g 200 mL/hr over 30 Minutes Intravenous  Once 07/14/20 0501 07/14/20 0753   07/14/20 0115  piperacillin-tazobactam (ZOSYN) IVPB 3.375 g        3.375 g 100 mL/hr over 30 Minutes Intravenous  Once 07/14/20 0111 07/14/20 0157       Assessment/Plan  HTN - meds on hold due to hypotension Obesity, s/p lap band placement - now s/p removal VDRF - extubated on 12/8 ARF - cr improved to 1.28 Septic shock - resolving Tachycardia - likely secondary to rebound from not taking home BB, will schedule lopressor 5mg  q 6 hrs.  POD 3, s/p ex lap with removal of lap band for pneumoperitoneum -no obvious source identified.  Few bubbles near his lap band so this was removed -needs UGI, but holding for now given high NGT output, issue is no UGIs get done over weekend.  ? Should we go ahead and get this done today. -cont NGT, still has post op ileus -cont abx therapy -adjust meds for pain control -VAC  change MWF -cont JP drain, no evidence of leak at this point -PT/OT eval for mobilization -pulm toilet and IS   FEN - NPO/IVFs/NGT VTE - heparin ID - zosyn   LOS: 3 days    Henreitta Cea , [facility] Surgery 07/17/2020, 8:23 AM Please see Amion for pager number during day hours 7:00am-4:30pm or 7:00am -11:30am on weekends

## 2020-07-17 NOTE — Progress Notes (Signed)
S: 59 yo M POD 3 ex lap with removal of lap band for pneumoperitoneum. Extubated 12/8. Precedex and neo discontinued 12/9, maintained hemodynamic stability and PCCM signed off.   Overnight pt tachycardic, requiring IVF bolus and metoprolol PCCM asked to see this morning as overnight Elink initiated workup of tachycardia    O Hr 134 RR 16 BP 131/90 SpO2 110% on [employer]   Patient undergoing wound vac change at time of evaluation  Ill appearing obese middle aged M supine in moderate discomfort due to vac change Symmetrical chest expansion, no accessory use Tachy 2+ pulses s1s2  Abd round tender  GU deferred Skin is pink, no rash  No obvious joint deformity no cyanosis or clubbing   A/P Tachycardia -pt with hx of palpitations for which he takes metop BID at home -on 12/9 we deferred restarting metop as pt was coming off of precedex and neo, and was not exhibiting tachycardia to this degree -Suspect that overnight pt tachycardia is more a reflection of baseline condition + acute pain 2/2 abd surgery  -home med: Metop 50mg  PO BID  P -pt unable to take PO meds at this point so restarting home metop not yet an option -Rec continuing IV metop -- currently ordered as 5mg  q6hr -- room to adjust as needed -rec ongoing pain management as you are doing   Elevated TSH -TSH checked overnight in setting of tachycardia to r/o thyrotoxicosis.  -no known hx hypothyroidism  -new discovery of underlying thyroid dysfunction vs acute abnormality in context of acute illness P -would rec rechecking prior to initiating levothyroxine + checking T3 T4       Pt remains stable. Discussed with CCS -- PCCM with sign off. Please engage if needed or if patient status changes    CCT: n/a   Eliseo Gum MSN, AGACNP-BC [place] [phone] If no answer, [phone] 07/17/2020, 8:38 AM

## 2020-07-17 NOTE — Evaluation (Signed)
Physical Therapy Evaluation Patient Details Name: Patrick Chapman MRN: [id number] DOB: Feb 27, 1961 Today's Date: 07/17/2020   History of Present Illness  The pt is a 59 yo male presenting with severe left upper quadrant pain, found to have pneumoperitoneum. S/p ex lap and removal of lap-band on 12/07. Extubated 12/8. PMH includes: obesity, HTN, GERD, sleep apnea, with lap band in 2009.  Clinical Impression  Pt in bed upon arrival of PT, agreeable to evaluation at this time. Prior to admission the pt was completely independent with mobility and ADLs, working mostly remotely but able to drive and visit job sites as needed. The pt now presents with limitations in functional mobility, endurance, strength, and stability due to above dx, and will continue to benefit from skilled PT to address these deficits. The pt was able to demo good initial bed mobility and transfers at this time, but required modA for bed mobility and minA of 2 to complete OOB transfers to recliner. The pt was further limited by current limitations in endurance and onset of pain and clamminess with mobility. HR of 145 at rest that increased to 155 with OOB mobility. The pt will continue to benefit from skilled PT to progress functional endurance and stability to allow for independence with transfers and progression of OOB mobility to include gait and stair training.      Follow Up Recommendations No PT follow up;Supervision for mobility/OOB    Equipment Recommendations   (defer until further OOB gait, possibly RW)    Recommendations for Other Services       Precautions / Restrictions Precautions Precautions: Fall Precaution Comments: NG tube, JP dain R lower abdomen, tachy Restrictions Weight Bearing Restrictions: No      Mobility  Bed Mobility Overal bed mobility: Needs Assistance Bed Mobility: Supine to Sit     Supine to sit: Mod assist;+2 for physical assistance     General bed mobility comments: assist to lift  trunk    Transfers Overall transfer level: Needs assistance Equipment used: 2 person hand held assist Transfers: Sit to/from Stand;Stand Pivot Transfers Sit to Stand: Min assist;+2 physical assistance;+2 safety/equipment Stand pivot transfers: Min assist;+2 safety/equipment       General transfer comment: verbal cues for technique and sequencing and assist to steady  Ambulation/Gait Ambulation/Gait assistance: Min assist;+2 safety/equipment Gait Distance (Feet): 4 Feet Assistive device: 2 person hand held assist Gait Pattern/deviations: Step-to pattern;Decreased stride length Gait velocity: decreased Gait velocity interpretation: <1.31 ft/sec, indicative of household ambulator General Gait Details: small lateral steps to recliner with BUE support through [facility] for management of lines and stability. pt able to move BLE without buckling or assistq     Balance Overall balance assessment: Needs assistance Sitting-balance support: Single extremity supported;Feet supported Sitting balance-Leahy Scale: Poor Sitting balance - Comments: requires UE support Postural control: Posterior lean Standing balance support: Bilateral upper extremity supported Standing balance-Leahy Scale: Poor Standing balance comment: requires UE support                             Pertinent Vitals/Pain Pain Assessment: Faces Faces Pain Scale: Hurts little more Pain Location: abdomen Pain Descriptors / Indicators: Operative site guarding;Grimacing;Guarding Pain Intervention(s): Limited activity within patient's tolerance;Monitored during session;Repositioned    Home Living Family/patient expects to be discharged to:: Private residence Living Arrangements: Spouse/significant other Available Help at Discharge: Family;Available PRN/intermittently Type of Home: House Home Access: Stairs to enter Entrance Stairs-Rails: None Entrance Stairs-Number of Steps: 3  in front, 2 on the side Home Layout:  One level Home Equipment: None Additional Comments: Lives with wife, who just started a new job so unsure of how much assist she will be able to provide.  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    Extremity/Trunk Assessment   Upper Extremity Assessment Upper Extremity Assessment: Generalized weakness    Lower Extremity Assessment Lower Extremity Assessment: Generalized weakness    Cervical / Trunk Assessment Cervical / Trunk Assessment: Other exceptions Cervical / Trunk Exceptions: obesity and abdominal wound with wound VAC  Communication   Communication: No difficulties  Cognition Arousal/Alertness: Awake/alert Behavior During Therapy: WFL for tasks assessed/performed;Anxious Overall Cognitive Status: Impaired/Different from baseline                                 General Comments: Pt reports he feels a little "cob webby".   Slight difficulty recalling grandchildren and daughters info      General Comments General comments (skin integrity, edema, etc.): HR 143-155; BP supine 144/81; sitting 128/89; after transfer to chair 112/90    Exercises     Assessment/Plan    PT Assessment Patient needs continued PT services  PT Problem List Decreased strength;Decreased activity tolerance;Decreased balance;Decreased mobility;Pain       PT Treatment Interventions Gait training;DME instruction;Stair training;Therapeutic activities;Functional mobility training;Therapeutic exercise;Balance training;Patient/family education    PT Goals (Current goals can be found in the Care Plan section)  Acute Rehab PT Goals Patient Stated Goal: to get the NG tube out PT Goal Formulation: With patient Time For Goal Achievement: 07/31/20 Potential to Achieve Goals: Good    Frequency Min 4X/week   Barriers to discharge   unsure of  home support    Co-evaluation PT/OT/SLP Co-Evaluation/Treatment: Yes Reason for Co-Treatment: For patient/therapist safety;To address functional/ADL transfers PT goals addressed during session: Mobility/safety with mobility;Balance;Strengthening/ROM OT goals addressed during session: ADL's and self-care       AM-PAC PT "6 Clicks" Mobility  Outcome Measure Help needed turning from your back to your side while in a flat bed without using bedrails?: A Little Help needed moving from lying on your back to sitting on the side of a flat bed without using bedrails?: A Lot Help needed moving to and from a bed to a chair (including a wheelchair)?: A Lot Help needed standing up from a chair using your arms (e.g., wheelchair or bedside chair)?: A Lot Help needed to walk in hospital room?: A Lot Help needed climbing 3-5 steps with a railing? : Total 6 Click Score: 12    End of Session         PT Visit Diagnosis: Other abnormalities of gait and mobility (R26.89);Muscle weakness (generalized) (M62.81)    Time: 8115-7262 PT Time Calculation (min) (ACUTE ONLY): 36 min   Charges:   PT Evaluation $PT Eval Moderate Complexity: 1 Mod          Karma Ganja, PT, DPT   Acute Rehabilitation Department Pager #: [phone]  Otho Bellows 07/17/2020, 11:24 AM

## 2020-07-17 NOTE — Progress Notes (Signed)
[place] Progress Note Patient Name: Patrick Chapman DOB: [birth year] MRN: [id number]   Date of Service  07/17/2020  HPI/Events of Note  Patient with persistent sinus tachycardia that did not respond to a 500 ml iv fluid bolus. Blood pressure is up to 164/110 following fluid bolus.  eICU Interventions  BNP result pending, will check TSH to r/o occult thyrotoxicosis, will give Lopressor 2.5 mg iv x 1 pending results of outstanding labs, if Lopressor fails to improve the rate, and labs do not provide an explanation for the tachycardia, will order CTA chest to r/o PE.        [name] Joeangel Jeanpaul 07/17/2020, 3:15 AM

## 2020-07-18 DIAGNOSIS — I483 Typical atrial flutter: Secondary | ICD-10-CM

## 2020-07-18 DIAGNOSIS — I4892 Unspecified atrial flutter: Secondary | ICD-10-CM | POA: Diagnosis not present

## 2020-07-18 DIAGNOSIS — K668 Other specified disorders of peritoneum: Secondary | ICD-10-CM | POA: Diagnosis not present

## 2020-07-18 LAB — BASIC METABOLIC PANEL
Anion gap: 10 (ref 5–15)
BUN: 15 mg/dL (ref 6–20)
CO2: 26 mmol/L (ref 22–32)
Calcium: 8.2 mg/dL — ABNORMAL LOW (ref 8.9–10.3)
Chloride: 103 mmol/L (ref 98–111)
Creatinine, Ser: 0.97 mg/dL (ref 0.61–1.24)
GFR, Estimated: >60 mL/min (ref >60)
Glucose, Bld: 85 mg/dL (ref 70–99)
Potassium: 3.9 mmol/L (ref 3.5–5.1)
Sodium: 139 mmol/L (ref 135–145)

## 2020-07-18 LAB — BPAM RBC
Blood Product Expiration Date: 202201042359
Blood Product Expiration Date: 202201042359
Unit Type and Rh: 5100
Unit Type and Rh: 5100

## 2020-07-18 LAB — TYPE AND SCREEN
ABO/RH(D): O POS
Antibody Screen: NEGATIVE
Unit division: 0
Unit division: 0

## 2020-07-18 LAB — GLUCOSE, CAPILLARY
Glucose-Capillary: 86 mg/dL (ref 70–99)
Glucose-Capillary: 78 mg/dL (ref 70–99)
Glucose-Capillary: 66 mg/dL — ABNORMAL LOW (ref 70–99)
Glucose-Capillary: 83 mg/dL (ref 70–99)
Glucose-Capillary: 87 mg/dL (ref 70–99)
Glucose-Capillary: 100 mg/dL — ABNORMAL HIGH (ref 70–99)

## 2020-07-18 LAB — CBC
HCT: 35.3 % — ABNORMAL LOW (ref 39.0–52.0)
Hemoglobin: 11.3 g/dL — ABNORMAL LOW (ref 13.0–17.0)
MCH: 30.8 pg (ref 26.0–34.0)
MCHC: 32 g/dL (ref 30.0–36.0)
MCV: 96.2 fL (ref 80.0–100.0)
Platelets: UNDETERMINED 10*3/uL (ref 150–400)
RBC: 3.67 MIL/uL — ABNORMAL LOW (ref 4.22–5.81)
RDW: 14.3 % (ref 11.5–15.5)
WBC: 11.7 10*3/uL — ABNORMAL HIGH (ref 4.0–10.5)
nRBC: 0 % (ref 0.0–0.2)

## 2020-07-18 MED ORDER — DEXTROSE IN LACTATED RINGERS 5 % IV SOLN: INTRAVENOUS | Status: DC

## 2020-07-18 MED ORDER — AMIODARONE HCL IN DEXTROSE 360-4.14 MG/200ML-% IV SOLN
30.0000 mg/h | INTRAVENOUS | Status: DC
  Administered 2020-07-18 – 2020-07-22 (×8): 30 mg/h via INTRAVENOUS
  Filled 2020-07-18 (×10): qty 200

## 2020-07-18 MED ORDER — ADENOSINE 6 MG/2ML IV SOLN
INTRAVENOUS | Status: AC
  Filled 2020-07-18: qty 6

## 2020-07-18 MED ORDER — AMIODARONE HCL IN DEXTROSE 360-4.14 MG/200ML-% IV SOLN
INTRAVENOUS | Status: AC
  Filled 2020-07-18: qty 200

## 2020-07-18 MED ORDER — AMIODARONE LOAD VIA INFUSION
150.0000 mg | Freq: Once | INTRAVENOUS | Status: AC
  Administered 2020-07-18: 150 mg via INTRAVENOUS
  Filled 2020-07-18: qty 83.34

## 2020-07-18 MED ORDER — ADENOSINE 6 MG/2ML IV SOLN: 12.0000 mg | Freq: Once | INTRAVENOUS | Status: DC

## 2020-07-18 MED ORDER — AMIODARONE HCL IN DEXTROSE 360-4.14 MG/200ML-% IV SOLN
60.0000 mg/h | INTRAVENOUS | Status: AC
  Administered 2020-07-18 (×2): 60 mg/h via INTRAVENOUS
  Filled 2020-07-18: qty 200

## 2020-07-18 MED ORDER — DEXTROSE 50 % IV SOLN
INTRAVENOUS | Status: AC
  Administered 2020-07-18: 25 mL
  Filled 2020-07-18: qty 50

## 2020-07-18 MED ORDER — ADENOSINE 6 MG/2ML IV SOLN
6.0000 mg | Freq: Once | INTRAVENOUS | Status: AC
  Administered 2020-07-18: 6 mg via INTRAVENOUS

## 2020-07-18 NOTE — Progress Notes (Signed)
S: 59 yo M POD 3 ex lap with removal of lap band for pneumoperitoneum. Extubated 12/8. Precedex and neo discontinued 12/9,  PCCM reconsulted for persistent tachycardia  Heart rate has been consistently in the 140s to 150s last 24 hours in spite of multiple doses of metoprolol, IV fluid bolus  Subjective -no palpitations or chest pain Afebrile Good urine output On 2 L oxygen  O Vitals:   07/18/20 0800 07/18/20 0900  BP: 115/82 (!) 128/94  Pulse: (!) 152 (!) 154  Resp: (!) 24 20  Temp:    SpO2: 99% 100%     Gen:      Obese middle-aged man, no distress , lying supine in bed HEENT:  EOMI, sclera anicteric, mild pallor Neck:     No JVD; no thyromegaly Lungs:    Bilateral decreased breath sounds, no accessory muscle use CV:         Regular rate and rhythm; no murmurs Abd:      Distended obese, no guarding, non-tender; no palpable masses Ext:    No edema; adequate peripheral perfusion Skin:      Warm and dry; no rash Neuro: alert and oriented x 3   CT angiogram was obtained which I independently reviewed, no pulmonary bothersome, sign of pulmonary hypertension. Echo 12/8 reviewed -unable to estimate RVSP.   Labs show normal electrolytes, leukocytosis  A Atrial flutter/RVR  -I gave him 6 mg of adenosine, he has flutter waves,  -pt with hx of palpitations for which he takes metop BID at home -home med: Metop 50mg  PO BID  P -call cardiology consult, surgery to decide whether okay to give IV heparin -Correct hypokalemia electrolytes as needed  -Pulmonary hypertension indicated by CT but unable to confirm on echo, likely has OSA be evaluated as outpatient    Elevated TSH -difficult to interpret in critical care setting, certainly does not have thyrotoxicosis, reassess as outpatient    AKI has resolved  Kara Mead MD. FCCP. [facility] See Amion for pager  If no response to pager , please call [phone]  After 7:00 pm call Elink  [phone]      07/18/2020, 9:50 AM

## 2020-07-18 NOTE — Plan of Care (Signed)
  Problem: Clinical Measurements: Goal: Ability to maintain clinical measurements within normal limits will improve Outcome: Progressing Goal: Will remain free from infection Outcome: Progressing Goal: Diagnostic test results will improve Outcome: Progressing Goal: Respiratory complications will improve Outcome: Progressing Goal: Cardiovascular complication will be avoided Outcome: Progressing   Problem: Pain Managment: Goal: General experience of comfort will improve Outcome: Progressing   

## 2020-07-18 NOTE — Progress Notes (Signed)
  Amiodarone Drug - Drug Interaction Consult Note  Recommendations: Watch for increased risk of bradycardia, AV block and myocardial depression on beta blocker  Amiodarone is metabolized by the cytochrome P450 system and therefore has the potential to cause many drug interactions. Amiodarone has an average plasma half-life of 50 days (range 20 to 100 days).   There is potential for drug interactions to occur several weeks or months after stopping treatment and the onset of drug interactions may be slow after initiating amiodarone.   []  Statins: Increased risk of myopathy. Simvastatin- restrict dose to 20mg  daily. Other statins: counsel patients to report any muscle pain or weakness immediately.  []  Anticoagulants: Amiodarone can increase anticoagulant effect. Consider warfarin dose reduction. Patients should be monitored closely and the dose of anticoagulant altered accordingly, remembering that amiodarone levels take several weeks to stabilize.  []  Antiepileptics: Amiodarone can increase plasma concentration of phenytoin, the dose should be reduced. Note that small changes in phenytoin dose can result in large changes in levels. Monitor patient and counsel on signs of toxicity.  [x]  Beta blockers: increased risk of bradycardia, AV block and myocardial depression. Sotalol - avoid concomitant use. *Lopressor  []   Calcium channel blockers (diltiazem and verapamil): increased risk of bradycardia, AV block and myocardial depression.  []   Cyclosporine: Amiodarone increases levels of cyclosporine. Reduced dose of cyclosporine is recommended.  []  Digoxin dose should be halved when amiodarone is started.  []  Diuretics: increased risk of cardiotoxicity if hypokalemia occurs.  []  Oral hypoglycemic agents (glyburide, glipizide, glimepiride): increased risk of hypoglycemia. Patient's glucose levels should be monitored closely when initiating amiodarone therapy.   []  Drugs that prolong the QT  interval:  Torsades de pointes risk may be increased with concurrent use - avoid if possible.  Monitor QTc, also keep magnesium/potassium WNL if concurrent therapy can't be avoided. Marland Kitchen Antibiotics: e.g. fluoroquinolones, erythromycin. . Antiarrhythmics: e.g. quinidine, procainamide, disopyramide, sotalol. . Antipsychotics: e.g. phenothiazines, haloperidol.  . Lithium, tricyclic antidepressants, and methadone. Thank You,  Margretta Sidle Dohlen  07/18/2020 12:17 PM

## 2020-07-18 NOTE — Consult Note (Addendum)
Cardiology Consultation:   Patient ID: Patrick Chapman MRN: [id number]; DOB: 08/07/1961  Admit date: 07/14/2020 Date of Consult: 07/18/2020  Primary Care Provider: Celene Squibb, MD [facility] Cardiologist: Carlyle Dolly, MD  [facility] Electrophysiologist:  None    Patient Profile:   Patrick Chapman is a 59 y.o. male with a hx of palpitation, hypertension, prior lap band and history of kidney stone who is being seen today for the evaluation of atrial flutter at the request of Dr. Johnsie Cancel.  History of Present Illness:   Patrick Chapman is a 59 year old male with past medical history of palpitation, hypertension, prior lap band and history of kidney stone.  Patient was previously evaluated by cardiology service for palpitation.  Holter monitor placed in November 2016 showed primarily sinus rhythm with occasional isolated PVCs.  He was seen by Dr. Harl Bowie who placed him on 50 mg twice daily of metoprolol tartrate.  He was last seen virtually by Dr. Harl Bowie in April 2021 at which time he was doing well.  He was in his usual state of health until 07/13/2020 at which time he had progressive upper abdominal pain.  He went to the emergency room shortly after midnight on 12/7.  CT and x-ray at any point hospital was concerning for pneumoperitoneum.  The case was discussed with Dr. Ann Lions of general surgery and the patient was subsequently transferred to [facility].  He was taken to the OR and underwent exploratory laparotomy, removal of lap band and its components and now.  Social preparation was negative.  Postprocedure, PCCM was called to help manage ventilation.  He was eventually able to be extubated on 07/15/2020.  Hospital course was complicated by significant hypotension concerning for septic shock with systolic blood pressure in down to the 70s.  He was treated with pressors to keep his MAP > 65 mmHg.  Renal function initially worsened however later improved along with his blood pressure  based on nursing note, he went into atrial flutter shortly after midnight in the morning of 07/17/2020.  He was treated with Plavix both IV Lopressor and IV fluid.  CTA of the chest was negative for PE.  Despite rate control medication, heart rate has not improved.  He was given 6 mg of adenosine which broke his rhythm into atrial flutter waves however quickly came back to 2:1 atrial flutter.  Cardiology service consulted for atrial flutter with RVR.  TSH is elevated at the 11.   Past Medical History:  Diagnosis Date   GERD (gastroesophageal reflux disease)    Hypertension    Obesity    Sleep apnea    was tested, but had lap band surgery and with weight loss, no problems    Past Surgical History:  Procedure Laterality Date   ANTERIOR CERVICAL DECOMP/DISCECTOMY FUSION N/A 08/15/2014   Procedure: ANTERIOR CERVICAL DECOMPRESSION/DISCECTOMY FUSION 1 LEVEL;  Surgeon: Consuella Lose, MD;  Location: [place] [facility];  Service: Neurosurgery;  Laterality: N/A;  C56 anterior cervical fusion with interbody prosthesis plating and bonegraft C 5-6   APPLICATION OF WOUND VAC N/A 07/14/2020   Procedure: APPLICATION OF WOUND VAC;  Surgeon: Rolm Bookbinder, MD;  Location: [place];  Service: General;  Laterality: N/A;   BIOPSY  10/10/2019   Procedure: BIOPSY;  Surgeon: Daneil Dolin, MD;  [facility];  Service: Endoscopy;;   CHOLECYSTECTOMY     COLONOSCOPY WITH ESOPHAGOGASTRODUODENOSCOPY (EGD)  06/12/2012   Dr. Gala Romney: Status post prior lap band placement, otherwise normal study.  2 tubular adenomas removed from the colon.  Next colonoscopy in 5 years.   COLONOSCOPY WITH PROPOFOL N/A 10/10/2019   Procedure: COLONOSCOPY WITH PROPOFOL;  Surgeon: Daneil Dolin, MD;  Location: [facility];  Service: Endoscopy;  Laterality: N/A;  11:00am   LAPAROSCOPIC GASTRIC BANDING  2009   Dr. Hassell Done   LAPAROTOMY N/A 07/14/2020   Procedure: EXPLORATORY LAPAROTOMY WITH REMOVAL OF LAP BAND AND COMPONENTS;  Surgeon:  Rolm Bookbinder, MD;  Location: [place];  Service: General;  Laterality: N/A;       Inpatient Medications: Scheduled Meds:  sodium chloride   Intravenous Once   adenosine (ADENOCARD) IV  12 mg Intravenous Once   chlorhexidine  15 mL Mouth Rinse BID   Chlorhexidine Gluconate Cloth  6 each Topical Daily   heparin injection (subcutaneous)  5,000 Units Subcutaneous Q8H   insulin aspart  0-15 Units Subcutaneous Q4H   mouth rinse  15 mL Mouth Rinse q12n4p   metoprolol tartrate  10 mg Intravenous Q6H   pantoprazole (PROTONIX) IV  40 mg Intravenous Q12H   sodium chloride flush  10-40 mL Intracatheter Q12H   Continuous Infusions:  sodium chloride Stopped (07/18/20 0200)   sodium chloride Stopped (07/16/20 1245)   acetaminophen 1,000 mg (07/18/20 7793)   lactated ringers Stopped (07/18/20 0329)   methocarbamol (ROBAXIN) IV 1,000 mg (07/18/20 0913)   piperacillin-tazobactam (ZOSYN)  IV 3.375 g (07/18/20 1040)   PRN Meds: sodium chloride, sodium chloride, diphenhydrAMINE, ipratropium-albuterol, metoprolol tartrate, morphine injection, ondansetron (ZOFRAN) IV, sodium chloride flush  Allergies:    Allergies  Allergen Reactions   Codeine Nausea And Vomiting and Rash    Social History:   Social History   Socioeconomic History   Marital status: Married    Spouse name: Not on file   Number of children: Not on file   Years of education: Not on file   Highest education level: Not on file  Occupational History   Not on file  Tobacco Use   Smoking status: Former Smoker    Packs/day: 1.00    Years: 10.00    Pack years: 10.00    Types: Cigarettes    Start date: 05/23/1977    Quit date: 05/24/1987    Years since quitting: 33.1   Smokeless tobacco: Current User    Types: Snuff  Vaping Use   Vaping Use: Never used  Substance and Sexual Activity   Alcohol use: Not Currently    Alcohol/week: 0.0 standard drinks    Comment: occassional   Drug use: No   Sexual activity: Yes     Partners: Female    Birth control/protection: Surgical    Comment: spouse  Other Topics Concern   Not on file  Social History Narrative   Not on file   Social Determinants of Health   Financial Resource Strain: Not on file  Food Insecurity: Not on file  Transportation Needs: Not on file  Physical Activity: Not on file  Stress: Not on file  Social Connections: Not on file  Intimate Partner Violence: Not on file    Family History:    Family History  Problem Relation Age of Onset   Heart attack Mother        living   Heart attack Father        living   Diabetes Father    Colon cancer Neg Hx      ROS:  Please see the history of present illness.   All other ROS reviewed and negative.  Physical Exam/Data:   Vitals:   07/18/20 0945 07/18/20 0946 07/18/20 0947 07/18/20 1000  BP:    108/88  Pulse: (!) 155 (!) 108 (!) 153 (!) 154  Resp: (!) 27 (!) 26 (!) 28 (!) 24  Temp:      TempSrc:      SpO2: 100% (!) 89% 99% 100%  Weight:      Height:        Intake/Output Summary (Last 24 hours) at 07/18/2020 1127 Last data filed at 07/18/2020 1040 Gross per 24 hour  Intake 1837.83 ml  Output 2720 ml  Net -882.17 ml   Last 3 Weights 07/14/2020 11/19/2019 10/10/2019  Weight (lbs) 290 lb 272 lb 274 lb  Weight (kg) 131.543 kg 123.378 kg 124.286 kg     Body mass index is 39.33 kg/m.  General:  Well nourished, well developed, in no acute distress HEENT: normal Lymph: no adenopathy Neck: no JVD Endocrine:  No thryomegaly Vascular: No carotid bruits; FA pulses 2+ bilaterally without bruits  Cardiac:  normal S1, S2; RRR; no murmur  Lungs:  clear to auscultation bilaterally, no wheezing, rhonchi or rales  Abd: soft, nontender, no hepatomegaly. Wound vac in place, abdomen open Ext: no edema Musculoskeletal:  No deformities, BUE and BLE strength normal and equal Skin: warm and dry  Neuro:  CNs 2-12 intact, no focal abnormalities noted Psych:  Normal affect   EKG:  The EKG  was personally reviewed and demonstrates: atrial flutter Telemetry:  Telemetry was personally reviewed and demonstrates:  2:1 atrial flutter  Relevant CV Studies:  Echo 07/15/2020  1. Left ventricular ejection fraction, by estimation, is 60 to 65%. The  left ventricle has normal function. The left ventricle has no regional  wall motion abnormalities. There is mild left ventricular hypertrophy.  Left ventricular diastolic parameters  are indeterminate.   2. Right ventricular systolic function is normal. The right ventricular  size is normal. Tricuspid regurgitation signal is inadequate for assessing  PA pressure.   3. The mitral valve is normal in structure. No evidence of mitral valve  regurgitation.   4. The aortic valve was not well visualized. Aortic valve regurgitation  is not visualized. No aortic stenosis is present.   Laboratory Data:  High Sensitivity Troponin:   Recent Labs  Lab 07/14/20 0101 07/14/20 0238  TROPONINIHS 2 5     Chemistry Recent Labs  Lab 07/16/20 0503 07/17/20 0310 07/18/20 0725  NA 138 137 139  K 4.3 4.0 3.9  CL 106 103 103  CO2 21* 23 26  GLUCOSE 102* 107* 85  BUN 36* 22* 15  CREATININE 1.84* 1.28* 0.97  CALCIUM 7.8* 7.7* 8.2*  GFRNONAA 42* >60 >60  ANIONGAP 11 11 10     Recent Labs  Lab 07/14/20 0101 07/15/20 0510 07/16/20 0503  PROT 8.1 5.1* 5.0*  ALBUMIN 4.5 2.6* 2.6*  AST 24 41 35  ALT 22 41 33  ALKPHOS 53 26* 28*  BILITOT 0.5 3.1* 1.4*   Hematology Recent Labs  Lab 07/16/20 0503 07/17/20 0310 07/18/20 0421  WBC 9.2 8.0 11.7*  RBC 3.76* 3.44* 3.67*  HGB 11.5* 10.5* 11.3*  HCT 36.8* 33.8* 35.3*  MCV 97.9 98.3 96.2  MCH 30.6 30.5 30.8  MCHC 31.3 31.1 32.0  RDW 14.3 14.5 14.3  PLT 159 131* PLATELET CLUMPS NOTED ON SMEAR, UNABLE TO ESTIMATE   BNP Recent Labs  Lab 07/17/20 0247  BNP 112.2*    DDimer No results for input(s): DDIMER in the last 168  hours.   Radiology/Studies:  CT ANGIO CHEST PE W OR WO  CONTRAST  Result Date: 07/17/2020 CLINICAL DATA:  Chest pain and shortness of breath. EXAM: CT ANGIOGRAPHY CHEST WITH CONTRAST TECHNIQUE: Multidetector CT imaging of the chest was performed using the standard protocol during bolus administration of intravenous contrast. Multiplanar CT image reconstructions and MIPs were obtained to evaluate the vascular anatomy. CONTRAST:  30mL OMNIPAQUE IOHEXOL 350 MG/ML SOLN COMPARISON:  None. FINDINGS: Examination is body habitus breathing motion. Cardiovascular: The heart is borderline enlarged. No pericardial effusion. The aorta is normal in caliber. No dissection. Scattered atherosclerotic calcifications at the aortic arch. Scattered coronary artery calcifications. The pulmonary arterial trunk and right and left main pulmonary arteries are enlarged suggesting pulmonary hypertension. No definite filling defects to suggest pulmonary embolism but study is limited, particularly at the lung bases. Mediastinum/Nodes: No mediastinal or hilar mass or lymphadenopathy. There is an NG tube coursing down esophagus and into the stomach. Lungs/Pleura: Limited evaluation due to breathing motion artifact but no pulmonary edema or worrisome pulmonary lesions. There are small bilateral pleural effusions and areas of atelectasis. Upper Abdomen: Marked inflammation of and around the pancreas consistent with prior CT scan showing acute pancreatitis. Musculoskeletal: No chest wall mass, supraclavicular or axillary adenopathy. Thyroid gland is unremarkable. The bony structures are intact. Review of the MIP images confirms the above findings. IMPRESSION: 1. Limited examination due to body habitus and breathing motion artifact. 2. No definite CT findings for pulmonary embolism. 3. Normal caliber thoracic aorta. 4. Enlarged pulmonary arterial trunk and right and left main pulmonary arteries suggesting pulmonary hypertension. 5. Small bilateral pleural effusions and areas of atelectasis. 6. Acute  pancreatitis. 7. Aortic atherosclerosis. Aortic Atherosclerosis (ICD10-I70.0). Electronically Signed   By: Marijo Sanes M.D.   On: 07/17/2020 06:42   DG CHEST PORT 1 VIEW  Result Date: 07/14/2020 CLINICAL DATA:  Intubated EXAM: PORTABLE CHEST 1 VIEW COMPARISON:  07/14/2020, CT 07/14/2020 FINDINGS: Interval intubation, tip of the endotracheal tube is about 2.5 cm superior to the carina. Esophageal tube tip below the diaphragm but incompletely visualized. Right IJ central venous catheter tip over the SVC. Low lung volumes with hazy atelectasis at the bases. Stable enlarged cardiomediastinal silhouette. Resolution of previously noted free air beneath the diaphragms. IMPRESSION: Endotracheal tube tip about 2.5 cm superior to the carina. Placement of esophageal tube and right IJ catheter as above. Low lung volumes with hazy atelectasis at the bases. Cardiomegaly. Electronically Signed   By: Donavan Foil M.D.   On: 07/14/2020 19:14   ECHOCARDIOGRAM COMPLETE  Result Date: 07/15/2020    ECHOCARDIOGRAM REPORT   Patient Name:   Patrick Chapman Date of Exam: 07/15/2020 Medical Rec #:  [id number]      Height:       72.0 in Accession #:    [id number]     Weight:       290.0 lb Date of Birth:  05-Sep-1960     BSA:          2.495 m Patient Age:    64 years       BP:           123/90 mmHg Patient Gender: M              HR:           109 bpm. Exam Location:  Inpatient Procedure: 2D Echo, Intracardiac Opacification Agent, Cardiac Doppler and Color            Doppler STAT  ECHO Indications:    Cardiomyopathy-Unspecified 425.9 / I42.9  History:        Patient has no prior history of Echocardiogram examinations.                 Signs/Symptoms:Hypotension; Risk Factors:Hypertension and Sleep                 Apnea. Abdominal Pain, emergent exploratory laparotomy, wound                 vac to stomach.  Sonographer:    Darlina Sicilian RDCS Referring Phys: [id number] Frederik Pear  Sonographer Comments: Echo performed with patient  supine and on artificial respirator and Technically difficult study due to poor echo windows. IMPRESSIONS  1. Left ventricular ejection fraction, by estimation, is 60 to 65%. The left ventricle has normal function. The left ventricle has no regional wall motion abnormalities. There is mild left ventricular hypertrophy. Left ventricular diastolic parameters are indeterminate.  2. Right ventricular systolic function is normal. The right ventricular size is normal. Tricuspid regurgitation signal is inadequate for assessing PA pressure.  3. The mitral valve is normal in structure. No evidence of mitral valve regurgitation.  4. The aortic valve was not well visualized. Aortic valve regurgitation is not visualized. No aortic stenosis is present. FINDINGS  Left Ventricle: Left ventricular ejection fraction, by estimation, is 60 to 65%. The left ventricle has normal function. The left ventricle has no regional wall motion abnormalities. Definity contrast agent was given IV to delineate the left ventricular  endocardial borders. The left ventricular internal cavity size was normal in size. There is mild left ventricular hypertrophy. Left ventricular diastolic parameters are indeterminate. Right Ventricle: The right ventricular size is normal. Right vetricular wall thickness was not assessed. Right ventricular systolic function is normal. Tricuspid regurgitation signal is inadequate for assessing PA pressure. Left Atrium: Left atrial size was normal in size. Right Atrium: Right atrial size was not well visualized. Pericardium: There is no evidence of pericardial effusion. Mitral Valve: The mitral valve is normal in structure. No evidence of mitral valve regurgitation. Tricuspid Valve: The tricuspid valve is grossly normal. Tricuspid valve regurgitation is not demonstrated. Aortic Valve: The aortic valve was not well visualized. Aortic valve regurgitation is not visualized. No aortic stenosis is present. Pulmonic Valve: The  pulmonic valve was not well visualized. Pulmonic valve regurgitation is not visualized. Aorta: The aortic root is normal in size and structure. IAS/Shunts: The interatrial septum was not well visualized.  LEFT VENTRICLE PLAX 2D LVIDd:         5.30 cm     Diastology LVIDs:         4.40 cm     LV e' medial:    8.05 cm/s LV PW:         1.10 cm     LV E/e' medial:  6.3 LV IVS:        1.10 cm     LV e' lateral:   7.29 cm/s LVOT diam:     1.90 cm     LV E/e' lateral: 7.0 LV SV:         37 LV SV Index:   15 LVOT Area:     2.84 cm  LV Volumes (MOD) LV vol d, MOD A2C: 54.5 ml LV vol d, MOD A4C: 79.1 ml LV vol s, MOD A2C: 27.2 ml LV vol s, MOD A4C: 34.9 ml LV SV MOD A2C:     27.3 ml LV SV MOD  A4C:     79.1 ml LV SV MOD BP:      33.8 ml RIGHT VENTRICLE RV S prime:     23.10 cm/s TAPSE (M-mode): 1.8 cm LEFT ATRIUM             Index LA diam:        3.80 cm 1.52 cm/m LA Vol (A2C):   20.9 ml 8.38 ml/m LA Vol (A4C):   23.6 ml 9.46 ml/m LA Biplane Vol: 22.7 ml 9.10 ml/m  AORTIC VALVE LVOT Vmax:   94.70 cm/s LVOT Vmean:  67.400 cm/s LVOT VTI:    0.131 m  AORTA Ao Root diam: 3.30 cm MITRAL VALVE MV Area (PHT): 6.27 cm    SHUNTS MV Decel Time: 121 msec    Systemic VTI:  0.13 m MV E velocity: 50.70 cm/s  Systemic Diam: 1.90 cm MV A velocity: 74.70 cm/s MV E/A ratio:  0.68 Oswaldo Milian MD Electronically signed by Oswaldo Milian MD Signature Date/Time: 07/15/2020/9:53:16 AM    Final      Assessment and Plan:   Paroxysmal 2:1 atrial flutter  -Echo 07/15/2020 EF 60 to 65%, mild LVH, no significant valve issue.  -Occurred in the setting of septic shock, AKI and pneumoperitoneum requiring surgery.  -IV heparin once okay by surgical team.  -Onset of atrial flutter is likely after midnight in the morning of 07/17/2020, atrial flutter has been persistent for the past close to 36 hours despite multiple rounds of rate control medication and a single dose of adenosine.  -Adenosine treatment clearly demonstrated flutter  waves consistent with atrial flutter  -He is on 50 mg twice daily of metoprolol tartrate at home, he has been started on 10 mg every 6 hours of metoprolol tartrate during this admission since he had recent hypotension  -We will discuss with MD, since he is currently stuck in 2:1 atrial flutter, rate control strategy may not work very well  -Although amiodarone is less ideal since onset of atrial flutter is about 36 hours, however this may be our only choice.  Consider start on IV amiodarone and place patient on TEE DCCV schedule on Monday or Tuesday.  -given CHA2DS2-Vasc score of 1 and occurrence of aflutter in the setting of shock, hopefully will not need anticoagulation long term.   Pneumoperitoneum: Status post exploratory laparotomy and remove of previous lap band components: Abdominal open incision managed by surgery team.  Wound VAC placed on the abdomen.  JP drain in place  Acute respiratory failure: Successfully weaned off of ventilator after surgery.  Hypotension: Today after discharge, he had a significant hypotension with systolic blood pressure down to the 70s.  He was treated with IV Zosyn and IV pressure.  Blood pressure eventually improved.  Lactic acid decreased.  Hypertension: Had a septic shock during hospitalization.  Home metoprolol was held for several days however restarted at a lower dose  Elevated TSH: TSH was elevated at 11.  Unclear significance in the setting of acute illness.  AKI: As result of septic shock.  Renal function returned back to baseline after improvement in the blood pressure            CHA2DS2-VASc Score = 1  This indicates a 0.6% annual risk of stroke. The patient's score is based upon: CHF History: No HTN History: Yes Diabetes History: No Stroke History: No Vascular Disease History: No Age Score: 0 Gender Score: 0          For questions or updates, please contact [facility] Please consult [email or web address] for contact info under     Signed, Almyra Deforest, [place]  07/18/2020 11:27 AM  Patient examined chart reviewed Discussed care with patient and wife. Exam with obese white male NGT in place Lungs clear distant heart sounds Post abdominal surgery with drain in place Palpable pedal pulses trace edema. Telemetry with rapid flutter rate 153 bpm. Agree with continuing lopressor Flutter typically is hard to control. Will start on heparin when ok with surgery . Dr Donne Hazel indicates possibly in am given extent of surgery  Start iv amiodarone as beta blocker not controlling rate  Not ideal candidate for TEE/DCC with sepsis and NGT in place CHADVASC 1  So would only need 3 weeks of anticoagulation theoretically post [facility] chemically or other. If he does not convert with amiodarone can consider TEE/DCC middle Of next week if he progresses with NGT out and PO intake   Jenkins Rouge MD [facility]

## 2020-07-18 NOTE — Progress Notes (Signed)
4 Days Post-Op   Subjective/Chief Complaint: Pain controlled, aflutter   Objective: Vital signs in last 24 hours: Temp:  [97.6 F (36.4 C)-101.1 F (38.4 C)] 98.5 F (36.9 C) (12/11 0800) Pulse Rate:  [108-155] 154 (12/11 1000) Resp:  [16-28] 24 (12/11 1000) BP: (108-146)/(70-111) 108/88 (12/11 1000) SpO2:  [89 %-100 %] 100 % (12/11 1000) Last BM Date:  (pta)  Intake/Output from previous day: 12/10 0701 - 12/11 0700 In: 2053.7 [I.V.:1002.5; IV Piggyback:1051.2] Out: 4110 [Urine:2650; Emesis/NG output:950; Drains:510] Intake/Output this shift: Total I/O In: -  Out: 90 [Drains:90]  GI: ng with bilious output, vac in place, soft approp tender, jp with serosang fluid  Lab Results:  Recent Labs    07/17/20 0310 07/18/20 0421  WBC 8.0 11.7*  HGB 10.5* 11.3*  HCT 33.8* 35.3*  PLT 131* PLATELET CLUMPS NOTED ON SMEAR, UNABLE TO ESTIMATE   BMET Recent Labs    07/17/20 0310 07/18/20 0725  NA 137 139  K 4.0 3.9  CL 103 103  CO2 23 26  GLUCOSE 107* 85  BUN 22* 15  CREATININE 1.28* 0.97  CALCIUM 7.7* 8.2*   PT/INR No results for input(s): LABPROT, INR in the last 72 hours. ABG No results for input(s): PHART, HCO3 in the last 72 hours.  Invalid input(s): PCO2, PO2  Studies/Results: CT ANGIO CHEST PE W OR WO CONTRAST  Result Date: 07/17/2020 CLINICAL DATA:  Chest pain and shortness of breath. EXAM: CT ANGIOGRAPHY CHEST WITH CONTRAST TECHNIQUE: Multidetector CT imaging of the chest was performed using the standard protocol during bolus administration of intravenous contrast. Multiplanar CT image reconstructions and MIPs were obtained to evaluate the vascular anatomy. CONTRAST:  34mL OMNIPAQUE IOHEXOL 350 MG/ML SOLN COMPARISON:  None. FINDINGS: Examination is body habitus breathing motion. Cardiovascular: The heart is borderline enlarged. No pericardial effusion. The aorta is normal in caliber. No dissection. Scattered atherosclerotic calcifications at the aortic arch.  Scattered coronary artery calcifications. The pulmonary arterial trunk and right and left main pulmonary arteries are enlarged suggesting pulmonary hypertension. No definite filling defects to suggest pulmonary embolism but study is limited, particularly at the lung bases. Mediastinum/Nodes: No mediastinal or hilar mass or lymphadenopathy. There is an NG tube coursing down esophagus and into the stomach. Lungs/Pleura: Limited evaluation due to breathing motion artifact but no pulmonary edema or worrisome pulmonary lesions. There are small bilateral pleural effusions and areas of atelectasis. Upper Abdomen: Marked inflammation of and around the pancreas consistent with prior CT scan showing acute pancreatitis. Musculoskeletal: No chest wall mass, supraclavicular or axillary adenopathy. Thyroid gland is unremarkable. The bony structures are intact. Review of the MIP images confirms the above findings. IMPRESSION: 1. Limited examination due to body habitus and breathing motion artifact. 2. No definite CT findings for pulmonary embolism. 3. Normal caliber thoracic aorta. 4. Enlarged pulmonary arterial trunk and right and left main pulmonary arteries suggesting pulmonary hypertension. 5. Small bilateral pleural effusions and areas of atelectasis. 6. Acute pancreatitis. 7. Aortic atherosclerosis. Aortic Atherosclerosis (ICD10-I70.0). Electronically Signed   By: Marijo Sanes M.D.   On: 07/17/2020 06:42    Anti-infectives: Anti-infectives (From admission, onward)   Start     Dose/Rate Route Frequency Ordered Stop   07/14/20 0900  piperacillin-tazobactam (ZOSYN) IVPB 3.375 g  Status:  Discontinued        3.375 g 100 mL/hr over 30 Minutes Intravenous Every 8 hours 07/14/20 0800 07/14/20 0802   07/14/20 0900  piperacillin-tazobactam (ZOSYN) IVPB 3.375 g  3.375 g 12.5 mL/hr over 240 Minutes Intravenous Every 8 hours 07/14/20 0804     07/14/20 0515  ceFAZolin (ANCEF) IVPB 2g/100 mL premix  Status:   Discontinued        2 g 200 mL/hr over 30 Minutes Intravenous  Once 07/14/20 0501 07/14/20 0753   07/14/20 0115  piperacillin-tazobactam (ZOSYN) IVPB 3.375 g        3.375 g 100 mL/hr over 30 Minutes Intravenous  Once 07/14/20 0111 07/14/20 0157      Assessment/Plan: POD 4 s/p ex lap with removal of lap band for pneumoperitoneum -source near his lap band with bubbling under water so this was removed -needs UGI and will plan this for monday -cont NGT, still has post op ileus -cont abx therapy, only needs five days postop -VAC change MWF -cont JP drain, no evidence of leak at this point -PT/OT eval for mobilization -pulm toilet and IS Aflutter- do not want to give iv heparin today, will recheck hb in am and if ok can start iv heparin then FEN -NPO/IVFs/NGT VTE -heparin sq ID -[email or web address] d4/5  Rolm Bookbinder 07/18/2020

## 2020-07-18 NOTE — Progress Notes (Signed)
ANTICOAGULATION CONSULT NOTE  Pharmacy Consult for heparin Indication: atrial fibrillation  Heparin Dosing Weight: 107.4 kg  Labs: Recent Labs    07/16/20 0503 07/17/20 0310 07/18/20 0421 07/18/20 0725  HGB 11.5* 10.5* 11.3*  --   HCT 36.8* 33.8* 35.3*  --   PLT 159 131* PLATELET CLUMPS NOTED ON SMEAR, UNABLE TO ESTIMATE  --   CREATININE 1.84* 1.28*  --  0.97    Assessment: 73 yom admitted 12/7 with progressive upper abdominal pain, taken emergently for ex-lap, removal of lap band 12/7. Cardiology consulted today for aflutter with RVR, HR 150s. Pharmacy consulted to dose heparin - do not start for at least 24hrs per Dr. Donne Hazel of Surgery. He is planning to repeat CBC 12/12 AM to determine when appropriate to start. Patient currently on heparin SQ for VTE prophylaxis and not on anticoagulation PTA. Hg low stable, plt down to 131 yesterday (clumped today). No current active bleed issues reported.  Will likely start heparin drip without bolus and target lower goal when ok'd by Surgery to start.  Goal of Therapy:  Heparin level 0.3-0.5 units/ml Monitor platelets by anticoagulation protocol: Yes   Plan:  Delay heparin IV start until at least 12/12 per Surgery. F/u CBC in AM - continue heparin SQ for VTE prophylaxis for now   Elicia Lamp, PharmD, BCPS Clinical Pharmacist 07/18/2020 12:20 PM

## 2020-07-18 NOTE — Progress Notes (Signed)
CRITICAL VALUE ALERT  Critical Value:  CBG 66  Date & Time Notied:  07/18/20 1146  Provider Notified: Dr. Elsworth Soho  Orders Received/Actions taken: 1/2 amp d50 given, order for D5LR placed

## 2020-07-19 DIAGNOSIS — I484 Atypical atrial flutter: Secondary | ICD-10-CM

## 2020-07-19 DIAGNOSIS — I483 Typical atrial flutter: Secondary | ICD-10-CM | POA: Diagnosis not present

## 2020-07-19 DIAGNOSIS — I4892 Unspecified atrial flutter: Secondary | ICD-10-CM | POA: Diagnosis not present

## 2020-07-19 LAB — CULTURE, BLOOD (ROUTINE X 2)
Culture: NO GROWTH
Culture: NO GROWTH

## 2020-07-19 LAB — CBC
HCT: 34.3 % — ABNORMAL LOW (ref 39.0–52.0)
Hemoglobin: 11.5 g/dL — ABNORMAL LOW (ref 13.0–17.0)
MCH: 31 pg (ref 26.0–34.0)
MCHC: 33.5 g/dL (ref 30.0–36.0)
MCV: 92.5 fL (ref 80.0–100.0)
Platelets: 201 10*3/uL (ref 150–400)
RBC: 3.71 MIL/uL — ABNORMAL LOW (ref 4.22–5.81)
RDW: 14.4 % (ref 11.5–15.5)
WBC: 9.6 10*3/uL (ref 4.0–10.5)
nRBC: 0 % (ref 0.0–0.2)

## 2020-07-19 LAB — BASIC METABOLIC PANEL
Anion gap: 12 (ref 5–15)
BUN: 15 mg/dL (ref 6–20)
CO2: 23 mmol/L (ref 22–32)
Calcium: 8.2 mg/dL — ABNORMAL LOW (ref 8.9–10.3)
Chloride: 103 mmol/L (ref 98–111)
Creatinine, Ser: 0.98 mg/dL (ref 0.61–1.24)
GFR, Estimated: >60 mL/min (ref >60)
Glucose, Bld: 119 mg/dL — ABNORMAL HIGH (ref 70–99)
Potassium: 3.8 mmol/L (ref 3.5–5.1)
Sodium: 138 mmol/L (ref 135–145)

## 2020-07-19 LAB — PHOSPHORUS: Phosphorus: 2.3 mg/dL — ABNORMAL LOW (ref 2.5–4.6)

## 2020-07-19 LAB — GLUCOSE, CAPILLARY
Glucose-Capillary: 103 mg/dL — ABNORMAL HIGH (ref 70–99)
Glucose-Capillary: 106 mg/dL — ABNORMAL HIGH (ref 70–99)
Glucose-Capillary: 108 mg/dL — ABNORMAL HIGH (ref 70–99)
Glucose-Capillary: 109 mg/dL — ABNORMAL HIGH (ref 70–99)
Glucose-Capillary: 117 mg/dL — ABNORMAL HIGH (ref 70–99)
Glucose-Capillary: 123 mg/dL — ABNORMAL HIGH (ref 70–99)

## 2020-07-19 LAB — MAGNESIUM: Magnesium: 2 mg/dL (ref 1.7–2.4)

## 2020-07-19 LAB — HEPARIN LEVEL (UNFRACTIONATED): Heparin Unfractionated: <0.1 IU/mL — ABNORMAL LOW (ref 0.30–0.70)

## 2020-07-19 MED ORDER — POTASSIUM PHOSPHATES 15 MMOLE/5ML IV SOLN
10.0000 mmol | Freq: Once | INTRAVENOUS | Status: AC
  Administered 2020-07-19: 10 mmol via INTRAVENOUS
  Filled 2020-07-19: qty 3.33

## 2020-07-19 MED ORDER — AMIODARONE IV BOLUS ONLY 150 MG/100ML
150.0000 mg | Freq: Once | INTRAVENOUS | Status: AC
  Administered 2020-07-19: 150 mg via INTRAVENOUS

## 2020-07-19 MED ORDER — HEPARIN (PORCINE) 25000 UT/250ML-% IV SOLN
2200.0000 [IU]/h | INTRAVENOUS | Status: DC
  Administered 2020-07-19: 1300 [IU]/h via INTRAVENOUS
  Administered 2020-07-20: 2000 [IU]/h via INTRAVENOUS
  Administered 2020-07-20: 1800 [IU]/h via INTRAVENOUS
  Administered 2020-07-21: 2200 [IU]/h via INTRAVENOUS
  Filled 2020-07-19 (×4): qty 250

## 2020-07-19 MED ORDER — ACETAMINOPHEN 10 MG/ML IV SOLN
1000.0000 mg | Freq: Four times a day (QID) | INTRAVENOUS | Status: AC | PRN
  Administered 2020-07-19: 1000 mg via INTRAVENOUS
  Filled 2020-07-19: qty 100

## 2020-07-19 NOTE — Progress Notes (Signed)
[facility] Progress Note Patient Name: [name] DOB: February 05, 1961 MRN: [id number]   Date of Service  07/19/2020  HPI/Events of Note  Patient with temperature spike to 100.7, bedside RN requesting order for PRN Tylenol.  eICU Interventions  Tylenol ordered to be administered PRN fever.        Patrick Chapman 07/19/2020, 12:10 AM

## 2020-07-19 NOTE — Progress Notes (Signed)
S: 59 yo M POD 5 ex lap with removal of lap band for pneumoperitoneum. Extubated 12/8.  PCCM reconsulted for persistent tachycardia Diagnosed as atrial flutter after adenosine on 12/11 , amiodarone started   Subjective -denies chest pain or palpitations. Heart rate 140s last 24 hours in spite of amnio drip Low-grade febrile 100.7  O Vitals:   07/19/20 0700 07/19/20 0800  BP: (!) 144/93   Pulse: (!) 147   Resp: 17   Temp:  98.1 F (36.7 C)  SpO2: 97%      Gen:      Obese middle-aged man, no distress , lying supine in bed HEENT:  EOMI, sclera anicteric, mild pallor , dark green NG drainage about 700 cc last 24 hours Neck:     No JVD; no thyromegaly Lungs:    Bilateral decreased breath sounds, no accessory muscle use CV:        S1-S2 tacky, regular Abd:      Decreased distention, soft obese, no guarding, non-tender; no palpable masses Ext:    No edema; adequate peripheral perfusion Skin:      Warm and dry; no rash Neuro: alert and oriented x 3   CT angiogram independently reviewed, no pulmonary embolism, sign of pulmonary hypertension. Echo 12/8 reviewed -unable to estimate RVSP.   Mild hypophosphatemia, no leukocytosis on labs, stable anemia  A Atrial flutter/RVR -pt with hx of palpitations for which he takes metop BID at home   P -Continue amiodarone drip. -Start heparin drip when okay with surgery may have to consider DCCV   -Pulmonary hypertension indicated by CT but unable to confirm on echo, likely has OSA -suggest evaluation as outpatient    Elevated TSH -difficult to interpret in critical care setting, certainly does not have thyrotoxicosis, reassess as outpatient    AKI has resolved Hypophosphatemia will be repleted  Abdominal distention appears better, Zosyn for peritonitis per surgery  PCCM will be available as needed  Kara Mead MD. FCCP. [facility] See Amion for pager  If no response to pager , please call [phone]   After 7:00 pm call Elink  [phone]     07/19/2020, 8:40 AM

## 2020-07-19 NOTE — Progress Notes (Signed)
ANTICOAGULATION CONSULT NOTE  Pharmacy Consult for heparin Indication: atrial fibrillation  Heparin Dosing Weight: 107.4 kg  Labs: Recent Labs    07/17/20 0310 07/18/20 0421 07/18/20 0725 07/19/20 0115  HGB 10.5* 11.3*  --  11.5*  HCT 33.8* 35.3*  --  34.3*  PLT 131* PLATELET CLUMPS NOTED ON SMEAR, UNABLE TO ESTIMATE  --  201  CREATININE 1.28*  --  0.97 0.98    Assessment: 27 yom admitted 12/7 with progressive upper abdominal pain, taken emergently for ex-lap, removal of lap band 12/7. Cardiology consulted for aflutter with RVR, HR 150s. Pharmacy consulted to dose heparin drip on 12/11 - do not start for at least 24hrs per Dr. Donne Hazel of Surgery. Patient currently on heparin SQ for VTE prophylaxis and not on anticoagulation PTA (will not need anticoagulation long term per Cards note).  12/12 update - Ok to start heparin drip without bolus and target lower goal per discussion with Surgery this morning. Last heparin SQ dose given at 0521 this AM. CBC stable. No current active bleed issues reported.   Goal of Therapy:  Heparin level 0.3-0.5 units/ml Monitor platelets by anticoagulation protocol: Yes   Plan:  D/c heparin SQ No bolus. Start heparin at 1300 units/hr Check 6hr heparin level Monitor daily CBC, s/sx bleeding F/u Cardiology plans for DCCV next week   Elicia Lamp, PharmD, BCPS Clinical Pharmacist 07/19/2020 11:03 AM

## 2020-07-19 NOTE — Progress Notes (Signed)
5 Days Post-Op   Subjective/Chief Complaint: Pain controlled. Passing flatus. Remains in a-flutter with HR in 140s. No nasuea. Hgb stable at 11.   Objective: Vital signs in last 24 hours: Temp:  [98.1 F (36.7 C)-100.7 F (38.2 C)] 98.1 F (36.7 C) (12/12 0800) Pulse Rate:  [146-155] 146 (12/12 1300) Resp:  [16-28] 20 (12/12 1300) BP: (99-164)/(79-114) 140/83 (12/12 1300) SpO2:  [91 %-100 %] 96 % (12/12 1300) Last BM Date:  (pta)  Intake/Output from previous day: 12/11 0701 - 12/12 0700 In: 1995.7 [I.V.:1428.8; IV Piggyback:566.9] Out: 2490 [Urine:1225; Emesis/NG output:800; Drains:465] Intake/Output this shift: Total I/O In: 543.8 [I.V.:454.1; IV Piggyback:89.7] Out: 95 [Drains:95]  General: resting comfortably, NAD Neuro: alert and oriented, no focal deficits HEENT: NG in place, bilious output Resp: normal work of breathing CV: tachycardic 140s Abdomen: mildly distended but soft, vac in place over midline incision, JP with serosanguinous drainage Extremities: warm and well-perfused   Lab Results:  Recent Labs    07/18/20 0421 07/19/20 0115  WBC 11.7* 9.6  HGB 11.3* 11.5*  HCT 35.3* 34.3*  PLT PLATELET CLUMPS NOTED ON SMEAR, UNABLE TO ESTIMATE 201   BMET Recent Labs    07/18/20 0725 07/19/20 0115  NA 139 138  K 3.9 3.8  CL 103 103  CO2 26 23  GLUCOSE 85 119*  BUN 15 15  CREATININE 0.97 0.98  CALCIUM 8.2* 8.2*   PT/INR No results for input(s): LABPROT, INR in the last 72 hours. ABG No results for input(s): PHART, HCO3 in the last 72 hours.  Invalid input(s): PCO2, PO2  Studies/Results: No results found.  Anti-infectives: Anti-infectives (From admission, onward)   Start     Dose/Rate Route Frequency Ordered Stop   07/14/20 0900  piperacillin-tazobactam (ZOSYN) IVPB 3.375 g  Status:  Discontinued        3.375 g 100 mL/hr over 30 Minutes Intravenous Every 8 hours 07/14/20 0800 07/14/20 0802   07/14/20 0900  piperacillin-tazobactam (ZOSYN) IVPB  3.375 g        3.375 g 12.5 mL/hr over 240 Minutes Intravenous Every 8 hours 07/14/20 0804 07/19/20 2359   07/14/20 0515  ceFAZolin (ANCEF) IVPB 2g/100 mL premix  Status:  Discontinued        2 g 200 mL/hr over 30 Minutes Intravenous  Once 07/14/20 0501 07/14/20 0753   07/14/20 0115  piperacillin-tazobactam (ZOSYN) IVPB 3.375 g        3.375 g 100 mL/hr over 30 Minutes Intravenous  Once 07/14/20 0111 07/14/20 0157      Assessment/Plan: POD 5 s/p ex lap with removal of lap band for pneumoperitoneum -source near his lap band with bubbling under water so this was removed -needs UGI and will plan this for monday -Passing flatus however NG output remains high and bilious. Keep NG today, upper GI tomorrow. -Abx for one more day -VAC change MWF -cont JP drain, no evidence of leak at this point -PT/OT eval for mobilization -pulm toilet and IS Aflutter- Ok to be begin heparin gtt today FEN -NPO/IVFs/NGT ID -[email or web address] d5/5  Dwan Bolt 07/19/2020

## 2020-07-19 NOTE — Progress Notes (Signed)
Primary Cardiologist:  Branch  Subjective:   Abdominal pain NPO wants to get OOB to chair   Objective:  Vitals:   07/19/20 0500 07/19/20 0609 07/19/20 0700 07/19/20 0800  BP: 138/90 (!) 143/98 (!) 144/93   Pulse: (!) 150 (!) 148 (!) 147   Resp: 18 (!) 22 17   Temp:    98.1 F (36.7 C)  TempSrc:    Oral  SpO2: 97% 97% 97%   Weight:      Height:        Intake/Output from previous day:  Intake/Output Summary (Last 24 hours) at 07/19/2020 0836 Last data filed at 07/19/2020 0700 Gross per 24 hour  Intake 1995.66 ml  Output 2490 ml  Net -494.34 ml    Physical Exam: Obese white male NG tube draining bilious material Distant heart sounds Lungs clear Post abdominal surgery with tube still in place Trace LE edema  Lab Results: Basic Metabolic Panel: Recent Labs    07/18/20 0725 07/19/20 0115  NA 139 138  K 3.9 3.8  CL 103 103  CO2 26 23  GLUCOSE 85 119*  BUN 15 15  CREATININE 0.97 0.98  CALCIUM 8.2* 8.2*  MG  --  2.0  PHOS  --  2.3*   Liver Function Tests: No results for input(s): AST, ALT, ALKPHOS, BILITOT, PROT, ALBUMIN in the last 72 hours. No results for input(s): LIPASE, AMYLASE in the last 72 hours. CBC: Recent Labs    07/18/20 0421 07/19/20 0115  WBC 11.7* 9.6  HGB 11.3* 11.5*  HCT 35.3* 34.3*  MCV 96.2 92.5  PLT PLATELET CLUMPS NOTED ON SMEAR, UNABLE TO ESTIMATE 201   Cardiac Enzymes: No results for input(s): CKTOTAL, CKMB, CKMBINDEX, TROPONINI in the last 72 hours. BNP: Invalid input(s): POCBNP D-Dimer: No results for input(s): DDIMER in the last 72 hours. Hemoglobin A1C: No results for input(s): HGBA1C in the last 72 hours. Fasting Lipid Panel: No results for input(s): CHOL, HDL, LDLCALC, TRIG, CHOLHDL, LDLDIRECT in the last 72 hours. Thyroid Function Tests: Recent Labs    07/17/20 0315  TSH 11.164*   Anemia Panel: No results for input(s): VITAMINB12, FOLATE, FERRITIN, TIBC, IRON, RETICCTPCT in the last 72 hours.  Imaging: No  results found.  Cardiac Studies:  ECG: atrial flutter no acute ST changes    Telemetry:  Atrial flutter rate 146  Echo: EF 60-65%   Medications:   . sodium chloride   Intravenous Once  . chlorhexidine  15 mL Mouth Rinse BID  . Chlorhexidine Gluconate Cloth  6 each Topical Daily  . heparin injection (subcutaneous)  5,000 Units Subcutaneous Q8H  . insulin aspart  0-15 Units Subcutaneous Q4H  . mouth rinse  15 mL Mouth Rinse q12n4p  . metoprolol tartrate  10 mg Intravenous Q6H  . pantoprazole (PROTONIX) IV  40 mg Intravenous Q12H  . sodium chloride flush  10-40 mL Intracatheter Q12H     . sodium chloride Stopped (07/18/20 0200)  . sodium chloride Stopped (07/16/20 1245)  . acetaminophen Stopped (07/19/20 0233)  . amiodarone 30 mg/hr (07/19/20 0600)  . dextrose 5% lactated ringers 50 mL/hr at 07/19/20 0600  . lactated ringers Stopped (07/18/20 0329)  . methocarbamol (ROBAXIN) IV 1,000 mg (07/19/20 0740)  . piperacillin-tazobactam (ZOSYN)  IV Stopped (07/19/20 0414)    Assessment/Plan:   1. Atrial Flutter:  Precipitated by sepsis and bowel perforation Surgery wanted to wait on heparin anticoagulation due to extent Of surgery Will let them write for it when they are  comfortable starting Dr Donne Hazel has not seen this am yet. He is still NPO Continue iv beta blockers Amiodarone started yesterday 07/18/20 Re bolus 150 mg today Can consider TEE/DCC latter next Week if his post op status progresses.   Jenkins Rouge 07/19/2020, 8:36 AM

## 2020-07-19 NOTE — Progress Notes (Signed)
[facility] Progress Note Patient Name: Patrick Chapman DOB: 1961/06/19 MRN: [id number]   Date of Service  07/19/2020  HPI/Events of Note  Patient is NPO and needs PRN Tylenol for fever.  eICU Interventions  PRN iv Tylenol ordered x 4 doses.        [name] Zedric Deroy 07/19/2020, 1:59 AM

## 2020-07-19 NOTE — Progress Notes (Signed)
ANTICOAGULATION CONSULT NOTE  Pharmacy Consult for heparin Indication: atrial fibrillation  Heparin Dosing Weight: 107.4 kg  Labs: Recent Labs    07/17/20 0310 07/18/20 0421 07/18/20 0725 07/19/20 0115 07/19/20 1848  HGB 10.5* 11.3*  --  11.5*  --   HCT 33.8* 35.3*  --  34.3*  --   PLT 131* PLATELET CLUMPS NOTED ON SMEAR, UNABLE TO ESTIMATE  --  201  --   HEPARINUNFRC  --   --   --   --  <0.10*  CREATININE 1.28*  --  0.97 0.98  --     Assessment: 75 yom admitted 12/7 with progressive upper abdominal pain, taken emergently for ex-lap, removal of lap band 12/7. Cardiology consulted for aflutter with RVR, Pharmacy consulted to dose heparin drip on 12/11 - do not start for at least . Patient not on anticoagulation PTA (will not need anticoagulation long term per Cards note). -heparin level < 0.1 on 1300 units/hr   Goal of Therapy:  Heparin level 0.3-0.5 units/ml Monitor platelets by anticoagulation protocol: Yes   Plan:  -Increase heparin to 1600 units/hr -heparin level and CBC in am  Hildred Laser, PharmD Clinical Pharmacist **Pharmacist phone directory can now be found on [email or web address] ([email or web address]).  Listed under [facility].

## 2020-07-20 ENCOUNTER — Inpatient Hospital Stay (HOSPITAL_COMMUNITY): Payer: PRIVATE HEALTH INSURANCE

## 2020-07-20 ENCOUNTER — Inpatient Hospital Stay: Payer: Self-pay

## 2020-07-20 DIAGNOSIS — I4892 Unspecified atrial flutter: Secondary | ICD-10-CM

## 2020-07-20 DIAGNOSIS — K668 Other specified disorders of peritoneum: Secondary | ICD-10-CM | POA: Diagnosis not present

## 2020-07-20 LAB — CBC
HCT: 36.9 % — ABNORMAL LOW (ref 39.0–52.0)
Hemoglobin: 12.1 g/dL — ABNORMAL LOW (ref 13.0–17.0)
MCH: 30.3 pg (ref 26.0–34.0)
MCHC: 32.8 g/dL (ref 30.0–36.0)
MCV: 92.5 fL (ref 80.0–100.0)
Platelets: 194 10*3/uL (ref 150–400)
RBC: 3.99 MIL/uL — ABNORMAL LOW (ref 4.22–5.81)
RDW: 14.6 % (ref 11.5–15.5)
WBC: 10.9 10*3/uL — ABNORMAL HIGH (ref 4.0–10.5)
nRBC: 0 % (ref 0.0–0.2)

## 2020-07-20 LAB — HEPARIN LEVEL (UNFRACTIONATED)
Heparin Unfractionated: <0.1 IU/mL — ABNORMAL LOW (ref 0.30–0.70)
Heparin Unfractionated: 0.1 IU/mL — ABNORMAL LOW (ref 0.30–0.70)
Heparin Unfractionated: 0.11 IU/mL — ABNORMAL LOW (ref 0.30–0.70)

## 2020-07-20 LAB — GLUCOSE, CAPILLARY
Glucose-Capillary: 100 mg/dL — ABNORMAL HIGH (ref 70–99)
Glucose-Capillary: 104 mg/dL — ABNORMAL HIGH (ref 70–99)
Glucose-Capillary: 115 mg/dL — ABNORMAL HIGH (ref 70–99)
Glucose-Capillary: 117 mg/dL — ABNORMAL HIGH (ref 70–99)
Glucose-Capillary: 118 mg/dL — ABNORMAL HIGH (ref 70–99)
Glucose-Capillary: 94 mg/dL (ref 70–99)

## 2020-07-20 MED ORDER — IOHEXOL 300 MG/ML  SOLN
300.0000 mL | Freq: Once | INTRAMUSCULAR | Status: AC | PRN
  Administered 2020-07-20: 300 mL

## 2020-07-20 MED ORDER — PHENOL 1.4 % MT LIQD
1.0000 | OROMUCOSAL | Status: DC | PRN
  Filled 2020-07-20: qty 177

## 2020-07-20 NOTE — Progress Notes (Signed)
Trauma/Critical Care Follow Up Note  Subjective:    Overnight Issues:   Objective:  Vital signs for last 24 hours: Temp:  [97.4 F (36.3 C)-98.8 F (37.1 C)] 98.6 F (37 C) (12/13 0800) Pulse Rate:  [25-155] 82 (12/13 0700) Resp:  [18-27] 18 (12/13 0700) BP: (116-164)/(76-114) 142/93 (12/13 0700) SpO2:  [92 %-100 %] 94 % (12/13 0700)  Hemodynamic parameters for last 24 hours:    Intake/Output from previous day: 12/12 0701 - 12/13 0700 In: 1937.3 [I.V.:1384.4; IV Piggyback:552.9] Out: 2185 [Urine:1000; Emesis/NG output:700; Drains:485]  Intake/Output this shift: Total I/O In: -  Out: 105 [Drains:105]  Vent settings for last 24 hours:    Physical Exam:  Gen: comfortable, no distress Neuro: non-focal exam HEENT: PERRL Neck: supple CV: aflutter to 140s, asymptomatic  Pulm: unlabored breathing Abd: soft, appropriately TTP, midline vac with good seal, JP woyj 485 SS o/p GU: UOP marginal Extr: wwp, no edema  Results for orders placed or performed during the hospital encounter of 07/14/20 (from the past 24 hour(s))  Glucose, capillary     Status: Abnormal   Collection Time: 07/19/20 11:53 AM  Result Value Ref Range   Glucose-Capillary 103 (H) 70 - 99 mg/dL  Glucose, capillary     Status: Abnormal   Collection Time: 07/19/20  4:18 PM  Result Value Ref Range   Glucose-Capillary 106 (H) 70 - 99 mg/dL  Heparin level (unfractionated)     Status: Abnormal   Collection Time: 07/19/20  6:48 PM  Result Value Ref Range   Heparin Unfractionated <0.10 (L) 0.30 - 0.70 IU/mL  Glucose, capillary     Status: Abnormal   Collection Time: 07/19/20  8:13 PM  Result Value Ref Range   Glucose-Capillary 109 (H) 70 - 99 mg/dL  Glucose, capillary     Status: Abnormal   Collection Time: 07/19/20 11:46 PM  Result Value Ref Range   Glucose-Capillary 117 (H) 70 - 99 mg/dL  CBC     Status: Abnormal   Collection Time: 07/20/20  2:26 AM  Result Value Ref Range   WBC 10.9 (H) 4.0 - 10.5  K/uL   RBC 3.99 (L) 4.22 - 5.81 MIL/uL   Hemoglobin 12.1 (L) 13.0 - 17.0 g/dL   HCT 36.9 (L) 39.0 - 52.0 %   MCV 92.5 80.0 - 100.0 fL   MCH 30.3 26.0 - 34.0 pg   MCHC 32.8 30.0 - 36.0 g/dL   RDW 14.6 11.5 - 15.5 %   Platelets 194 150 - 400 K/uL   nRBC 0.0 0.0 - 0.2 %  Heparin level (unfractionated)     Status: Abnormal   Collection Time: 07/20/20  2:26 AM  Result Value Ref Range   Heparin Unfractionated <0.10 (L) 0.30 - 0.70 IU/mL  Glucose, capillary     Status: Abnormal   Collection Time: 07/20/20  4:01 AM  Result Value Ref Range   Glucose-Capillary 118 (H) 70 - 99 mg/dL  Glucose, capillary     Status: Abnormal   Collection Time: 07/20/20  8:05 AM  Result Value Ref Range   Glucose-Capillary 115 (H) 70 - 99 mg/dL    Assessment & Plan:  Present on Admission: . Pneumoperitoneum    LOS: 6 days   Additional comments:I reviewed the patient's new clinical lab test results.   and I reviewed the patients new imaging test results.    POD6 s/p ex lap with removal of lap band for pneumoperitoneum - source near his lap band with bubbling under water  so this was removed - UGI today - passing flatus, plan for upper GI today - VAC change MWF - cont JP drain, no evidence of leak at this point - PT/OT eval for mobilization - pulm toilet and IS Aflutter - cont heparin gtt, amiodarone. Plan for re-bolus of amio today. Not therapeutic on hep gtt. Rate still poorly controlled. Plan for TEE/cardioversion later this week. Will d/w cards further.  FEN -NPO/IVFs/NGT ID - s/p [email or web address]   Jesusita Oka, MD Trauma & General Surgery Please use [email or web address] to contact on call provider  07/20/2020  *Care during the described time interval was provided by me. I have reviewed this patient's available data, including medical history, events of note, physical examination and test results as part of my evaluation.

## 2020-07-20 NOTE — Progress Notes (Signed)
ANTICOAGULATION CONSULT NOTE  Pharmacy Consult for heparin Indication: atrial fibrillation  Heparin Dosing Weight: 107.4 kg  Labs: Recent Labs    07/18/20 0421 07/18/20 0725 07/19/20 0115 07/19/20 1848 07/20/20 0226 07/20/20 1326  HGB 11.3*  --  11.5*  --  12.1*  --   HCT 35.3*  --  34.3*  --  36.9*  --   PLT PLATELET CLUMPS NOTED ON SMEAR, UNABLE TO ESTIMATE  --  201  --  194  --   HEPARINUNFRC  --   --   --  <0.10* <0.10* 0.10*  CREATININE  --  0.97 0.98  --   --   --     Assessment: 13 yom admitted 12/7 with progressive upper abdominal pain, taken emergently for ex-lap, removal of lap band 12/7. Cardiology consulted for aflutter with RVR, Pharmacy consulted to dose heparin drip Patient not on anticoagulation PTA (will not need anticoagulation long term per Cards note).  Heparin level subtherapeutic s/p rate increase to 1800 units/hr  Goal of Therapy:  Heparin level 0.3-0.5 units/ml Monitor platelets by anticoagulation protocol: Yes   Plan:  Increase heparin gtt to 2000 units/hr F/u 6 hour heparin level  Bertis Ruddy, PharmD Clinical Pharmacist Please check AMION for all [facility] numbers 07/20/2020 2:53 PM

## 2020-07-20 NOTE — Progress Notes (Signed)
Pt c/o right forearm pain and tightness where 2 PIVs are. Amiodarone and heparin infusing.  On assessment, forearm is swollen and tight feeling.  Dr. Bobbye Morton notified and coming to bedside.

## 2020-07-20 NOTE — Progress Notes (Signed)
Progress Note  Patient Name: Patrick Chapman Date of Encounter: 07/20/2020  Primary Cardiologist: Dr. Carlyle Dolly, MD  Subjective   No complaints today. HR remains uncontrolled>>>pt going to endoscopy however will need medication adjustment on return   Inpatient Medications    Scheduled Meds: . sodium chloride   Intravenous Once  . chlorhexidine  15 mL Mouth Rinse BID  . Chlorhexidine Gluconate Cloth  6 each Topical Daily  . insulin aspart  0-15 Units Subcutaneous Q4H  . mouth rinse  15 mL Mouth Rinse q12n4p  . metoprolol tartrate  10 mg Intravenous Q6H  . pantoprazole (PROTONIX) IV  40 mg Intravenous Q12H  . sodium chloride flush  10-40 mL Intracatheter Q12H   Continuous Infusions: . sodium chloride Stopped (07/18/20 0200)  . sodium chloride Stopped (07/16/20 1245)  . amiodarone 30 mg/hr (07/20/20 0600)  . dextrose 5% lactated ringers Stopped (07/20/20 0212)  . heparin 1,800 Units/hr (07/20/20 0600)  . lactated ringers Stopped (07/18/20 0329)  . methocarbamol (ROBAXIN) IV 1,000 mg (07/20/20 0030)   PRN Meds: sodium chloride, sodium chloride, diphenhydrAMINE, ipratropium-albuterol, metoprolol tartrate, morphine injection, ondansetron (ZOFRAN) IV, sodium chloride flush   Vital Signs    Vitals:   07/20/20 0500 07/20/20 0600 07/20/20 0700 07/20/20 0800  BP: (!) 163/108 116/83 (!) 142/93   Pulse: 84 (!) 25 82   Resp: 19 18 18    Temp:    98.6 F (37 C)  TempSrc:    Axillary  SpO2: 94% 95% 94%   Weight:      Height:        Intake/Output Summary (Last 24 hours) at 07/20/2020 0951 Last data filed at 07/20/2020 0600 Gross per 24 hour  Intake 1937.28 ml  Output 2185 ml  Net -247.72 ml   Filed Weights   07/14/20 0033  Weight: 131.5 kg    Physical Exam   General: Well developed, well nourished, NAD Neck: Negative for carotid bruits. No JVD Lungs:Clear to ausculation bilaterally. No wheezes, rales, or rhonchi. Breathing is unlabored. Cardiovascular:  Irregularly irregular. No murmurs, rubs, gallops, or LV heave appreciated. Abdomen: Soft, non-tender, non-distended. No obvious abdominal masses. Extremities: Mild BLE edema. Radial pulses 2+ bilaterally Neuro: Alert and oriented. No focal deficits. No facial asymmetry. MAE spontaneously. Psych: Responds to questions appropriately with normal affect.    Labs    Chemistry Recent Labs  Lab 07/14/20 0101 07/14/20 0957 07/15/20 0510 07/16/20 0503 07/17/20 0310 07/18/20 0725 07/19/20 0115  NA 139   < > 135 138 137 139 138  K 3.3*   < > 4.8 4.3 4.0 3.9 3.8  CL 104  --  106 106 103 103 103  CO2 25  --  18* 21* 23 26 23   GLUCOSE 237*  --  122* 102* 107* 85 119*  BUN 17  --  34* 36* 22* 15 15  CREATININE 1.24  --  2.63* 1.84* 1.28* 0.97 0.98  CALCIUM 9.1  --  7.9* 7.8* 7.7* 8.2* 8.2*  PROT 8.1  --  5.1* 5.0*  --   --   --   ALBUMIN 4.5  --  2.6* 2.6*  --   --   --   AST 24  --  41 35  --   --   --   ALT 22  --  41 33  --   --   --   ALKPHOS 53  --  26* 28*  --   --   --  BILITOT 0.5  --  3.1* 1.4*  --   --   --   GFRNONAA >60  --  27* 42* >60 >60 >60  ANIONGAP 10  --  11 11 11 10 12    < > = values in this interval not displayed.     Hematology Recent Labs  Lab 07/18/20 0421 07/19/20 0115 07/20/20 0226  WBC 11.7* 9.6 10.9*  RBC 3.67* 3.71* 3.99*  HGB 11.3* 11.5* 12.1*  HCT 35.3* 34.3* 36.9*  MCV 96.2 92.5 92.5  MCH 30.8 31.0 30.3  MCHC 32.0 33.5 32.8  RDW 14.3 14.4 14.6  PLT PLATELET CLUMPS NOTED ON SMEAR, UNABLE TO ESTIMATE 201 194    Cardiac EnzymesNo results for input(s): TROPONINI in the last 168 hours. No results for input(s): TROPIPOC in the last 168 hours.   BNP Recent Labs  Lab 07/17/20 0247  BNP 112.2*     DDimer No results for input(s): DDIMER in the last 168 hours.   Radiology    [place] EKG SITE RITE  Result Date: 07/20/2020 If Site Rite image not attached, placement could not be confirmed due to current cardiac rhythm.   Telemetry    07/20/20  Atrial flutter with poor rate control in the 130-140s - Personally Reviewed  ECG    No new tracing as of 07/20/20 - Personally Reviewed  Cardiac Studies   Echo 07/15/2020 1. Left ventricular ejection fraction, by estimation, is 60 to 65%. The  left ventricle has normal function. The left ventricle has no regional  wall motion abnormalities. There is mild left ventricular hypertrophy.  Left ventricular diastolic parameters  are indeterminate.  2. Right ventricular systolic function is normal. The right ventricular  size is normal. Tricuspid regurgitation signal is inadequate for assessing  PA pressure.  3. The mitral valve is normal in structure. No evidence of mitral valve  regurgitation.  4. The aortic valve was not well visualized. Aortic valve regurgitation  is not visualized. No aortic stenosis is present.   Patient Profile     59 y.o. male with a hx of palpitation, hypertension, prior lap band and history of kidney stone who is being seen today for the evaluation of atrial flutter at the request of Dr. Johnsie Cancel.  Assessment & Plan    1. Atrial flutter: -Felt to be precipitated by sepsis and bowel perforation -Anticoagulation intially deferred by surgery team due to extensive surgery and increased risk of bleeding -Continue IV beta blocker and amiodarone>>>rebolused yesterday due to uncontrolled rates. Given poor rate control>>will plan to rebolus once again today.   -Would plan for possibleTEE/DCCV given persistent atrial flutter>>? later this week if post operative status progresses without complication   -IV Heparin per pharmacy today>>>ok with surgery team   2. S/p exploratory laparotomy with removal of lap band for pneumoperitoneum: -Management per surgery team  -Currently leaving for UGI study    Signed, Kathyrn Drown NP-C [place] Pager: [phone] 07/20/2020, 9:51 AM     For questions or updates, please contact   Please consult [email or web address] for  contact info under Cardiology/STEMI.

## 2020-07-20 NOTE — Progress Notes (Signed)
Patient seen and examined for RUE pain. RUE swollen and tender at forearm IV site. Amio being administered vis this IV. Also tender, but less so, at the RUE [facility] IV, where heparin is being infused. Concern for IV infiltration. PICC ordered this AM, but IV team unavailable. Possible amio extravasation resulting in incomplete or no absorption and why his flutter remains uncontrolled. Recommend CVC placement and discussed this with patient and wife at bedside, both in agreement, informed consent signed by patient. Also discussed with cardiology, plans for cardioversion after 1-2 days of DOAC administration. UGI performed this AM and negative for leak. Will clamp NG and allows sips. Consider initiation of [place] 12/14.  Critical care time: 40min  Jesusita Oka, MD General and Basile Surgery

## 2020-07-20 NOTE — Progress Notes (Addendum)
Occupational Therapy Treatment Patient Details Name: Patrick Chapman MRN: [id number] DOB: [birth year] Today's Date: 07/20/2020    History of present illness The pt is a 59 yo male presenting with severe left upper quadrant pain, found to have pneumoperitoneum. S/p ex lap and removal of lap-band on 12/07. Extubated 12/8. PMH includes: obesity, HTN, GERD, sleep apnea, with lap band in 2009.   OT comments  Pt continues to present with decreased strength, balance, and activity tolerance. At rest, HR 140-150 and BP 174/96 (111) and 185/95 (113).  Despite pain and fatigue, pt agreeable to attempt OOB activity. Pt requiring Min-Mod +2 for bed mobility. Once seated, BP elevating to 190/134 (152). Returned to supine and BP 165/105 (120). Limited activity due to elevated HR and BP; notified RN. Continue to recommend dc to home once medically stable per physician. Will continue to follow acutely as admitted.    Follow Up Recommendations  No OT follow up;Supervision/Assistance - 24 hour    Equipment Recommendations  Tub/shower seat    Recommendations for Other Services      Precautions / Restrictions Precautions Precautions: Fall Precaution Comments: NG tube, JP dain R lower abdomen, tachy       Mobility Bed Mobility Overal bed mobility: Needs Assistance Bed Mobility: Supine to Sit;Sit to Supine     Supine to sit: Min assist;+2 for physical assistance;HOB elevated Sit to supine: Mod assist;+2 for physical assistance   General bed mobility comments: Assist to elevate trunk with Min A. Mod A +2 for managing legs and trunk in return to supine.  Transfers Overall transfer level: Needs assistance   Transfers: Lateral/Scoot Transfers          Lateral/Scoot Transfers: Mod assist;+2 physical assistance General transfer comment: Mod A +2 to laterally scoot towards HOB    Balance Overall balance assessment: Needs assistance Sitting-balance support: Single extremity supported;No upper  extremity supported;Feet supported Sitting balance-Leahy Scale: Fair                                     ADL either performed or assessed with clinical judgement   ADL Overall ADL's : Needs assistance/impaired                                       General ADL Comments: Pt planning for transfer to [facility] to attempt BM and get out of bed. BP elevated and return to supine     Vision       Perception     Praxis      Cognition Arousal/Alertness: Awake/alert Behavior During Therapy: WFL for tasks assessed/performed;Anxious Overall Cognitive Status: Impaired/Different from baseline                                 General Comments: Pt requiring increased time for processing. Slightly overwhelmed by increased information.        Exercises     Shoulder Instructions       General Comments HR 140-150 at rest. BP supine 174/96 (111) and 185/95 (113). BP sitting at EOB 190/134 (152). BP supine 165/105 (120). SPO2 90s on RA.    Pertinent Vitals/ Pain       Pain Assessment: Faces Faces Pain Scale: Hurts little more Pain Location: abdomen Pain Descriptors / Indicators:  Operative site guarding;Grimacing;Guarding Pain Intervention(s): Monitored during session;Limited activity within patient's tolerance;Repositioned  Home Living                                          Prior Functioning/Environment              Frequency  Min 2X/week        Progress Toward Goals  OT Goals(current goals can now be found in the care plan section)  Progress towards OT goals: Progressing toward goals  Acute Rehab OT Goals Patient Stated Goal: to get the NG tube out OT Goal Formulation: With patient Time For Goal Achievement: 07/31/20 Potential to Achieve Goals: Good ADL Goals Pt Will Perform Grooming: with min guard assist;standing Pt Will Perform Upper Body Bathing: with set-up;with supervision;sitting Pt Will Perform  Lower Body Bathing: with min guard assist;with adaptive equipment;sit to/from stand Pt Will Perform Upper Body Dressing: with set-up;with supervision;sitting Pt Will Perform Lower Body Dressing: with min guard assist;with adaptive equipment;sit to/from stand Pt Will Transfer to Toilet: with min guard assist;ambulating;regular height toilet;grab bars Pt Will Perform Toileting - Clothing Manipulation and hygiene: with min guard assist;sit to/from stand Pt Will Perform Tub/Shower Transfer: with min guard assist;ambulating;shower seat  Plan Discharge plan remains appropriate    Co-evaluation    PT/OT/SLP Co-Evaluation/Treatment: Yes Reason for Co-Treatment: To address functional/ADL transfers;For patient/therapist safety   OT goals addressed during session: ADL's and self-care      AM-PAC OT "6 Clicks" Daily Activity     Outcome Measure   Help from another person eating meals?: None Help from another person taking care of personal grooming?: A Little Help from another person toileting, which includes using toliet, bedpan, or urinal?: A Lot Help from another person bathing (including washing, rinsing, drying)?: A Lot Help from another person to put on and taking off regular upper body clothing?: A Lot Help from another person to put on and taking off regular lower body clothing?: Total 6 Click Score: 14    End of Session    OT Visit Diagnosis: Unsteadiness on feet (R26.81);Pain Pain - Right/Left:  (abdomen)   Activity Tolerance Patient limited by fatigue;Patient limited by pain;Treatment limited secondary to medical complications (Comment) (elevated HR, elevated BP)   Patient Left with call bell/phone within reach;in bed;with bed alarm set;with family/visitor present   Nurse Communication Mobility status        Time: 2951-8841 OT Time Calculation (min): 27 min  Charges: OT General Charges $OT Visit: 1 Visit OT Treatments $Therapeutic Activity: 8-22 mins  [place], OTR/L Acute Rehab Pager: [phone] Office: [place] 07/20/2020, 5:14 PM

## 2020-07-20 NOTE — Progress Notes (Signed)
[facility] for heparin Indication: atrial fibrillation  Heparin Dosing Weight: 107.4 kg  Labs: Recent Labs    07/18/20 0421 07/18/20 0725 07/19/20 0115 07/19/20 1848 07/20/20 0226  HGB 11.3*  --  11.5*  --  12.1*  HCT 35.3*  --  34.3*  --  36.9*  PLT PLATELET CLUMPS NOTED ON SMEAR, UNABLE TO ESTIMATE  --  201  --  194  HEPARINUNFRC  --   --   --  <0.10* <0.10*  CREATININE  --  0.97 0.98  --   --     Assessment: 37 yom admitted 12/7 with progressive upper abdominal pain, taken emergently for ex-lap, removal of lap band 12/7. Cardiology consulted for aflutter with RVR, Pharmacy consulted to dose heparin drip Patient not on anticoagulation PTA (will not need anticoagulation long term per Cards note). -heparin level < 0.1 on 1600 units/hr   Goal of Therapy:  Heparin level 0.3-0.5 units/ml Monitor platelets by anticoagulation protocol: Yes   Plan:  -Increase heparin to 1800 units/hr Check heparin level in 6 hours  Thanks for allowing pharmacy to be a part of this patient's care.  Excell Seltzer, PharmD Clinical Pharmacist

## 2020-07-20 NOTE — Progress Notes (Signed)
ANTICOAGULATION CONSULT NOTE  Pharmacy Consult for Heparin Indication: atrial fibrillation  Total Body Weight: 131.5 kg Height: 72 inches Heparin Dosing Weight: 107.4 kg  Labs: Recent Labs    07/18/20 0421 07/18/20 0725 07/19/20 0115 07/19/20 1848 07/20/20 0226 07/20/20 1326 07/20/20 2032  HGB 11.3*  --  11.5*  --  12.1*  --   --   HCT 35.3*  --  34.3*  --  36.9*  --   --   PLT PLATELET CLUMPS NOTED ON SMEAR, UNABLE TO ESTIMATE  --  201  --  194  --   --   HEPARINUNFRC  --   --   --    < > <0.10* 0.10* 0.11*  CREATININE  --  0.97 0.98  --   --   --   --    < > = values in this interval not displayed.    Assessment: 59 yr old man admitted 12/7 with progressive upper abdominal pain, taken emergently for ex-lap, removal of lap band 12/7. Cardiology was consulted for aflutter with RVR, felt to be precipitated by sepsis and bowel perforation. Pharmacy was consulted to dose IV heparin. Patient was not on anticoagulation PTA.  Heparin level ~5 hrs after heparin infusion was increased to 2000 units/hr was 0.11 units/ml, which is below the goal range for this pt. H/H, platelets stable. Per RN, no issues with bleeding; however, current IV into which heparin infusion is running is not functioning properly, so RN plans to move heparin infusion to central line and have lab obtain heparin level via peripheral sticks.  Goal of Therapy:  Heparin level 0.3-0.5 units/ml Monitor platelets by anticoagulation protocol: Yes   Plan:  Given issues with current IV, continue heparin infusion at 2000 units/hr and repeat heparin level in 6 hrs after infusion moved to central line Monitor daily heparin level, CBC Monitor for signs/symptoms of bleeding  Gillermina Hu, PharmD, BCPS, [facility] Clinical Pharmacist 07/20/2020 9:14 PM

## 2020-07-20 NOTE — Progress Notes (Signed)
Progress Note  Patient Name: Patrick Chapman Date of Encounter: 07/20/2020  Primary Cardiologist: Dr. Carlyle Dolly, MD  Subjective   No complaints today. HR remains uncontrolled>>>pt going to endoscopy however will need medication adjustment on return   Inpatient Medications    Scheduled Meds: . sodium chloride   Intravenous Once  . chlorhexidine  15 mL Mouth Rinse BID  . Chlorhexidine Gluconate Cloth  6 each Topical Daily  . insulin aspart  0-15 Units Subcutaneous Q4H  . mouth rinse  15 mL Mouth Rinse q12n4p  . metoprolol tartrate  10 mg Intravenous Q6H  . pantoprazole (PROTONIX) IV  40 mg Intravenous Q12H  . sodium chloride flush  10-40 mL Intracatheter Q12H   Continuous Infusions: . sodium chloride Stopped (07/18/20 0200)  . sodium chloride Stopped (07/16/20 1245)  . amiodarone 30 mg/hr (07/20/20 1200)  . dextrose 5% lactated ringers Stopped (07/20/20 0212)  . heparin 1,800 Units/hr (07/20/20 1200)  . lactated ringers Stopped (07/18/20 0329)  . methocarbamol (ROBAXIN) IV 1,000 mg (07/20/20 0030)   PRN Meds: sodium chloride, sodium chloride, diphenhydrAMINE, ipratropium-albuterol, metoprolol tartrate, morphine injection, ondansetron (ZOFRAN) IV, sodium chloride flush   Vital Signs    Vitals:   07/20/20 1000 07/20/20 1100 07/20/20 1200 07/20/20 1232  BP: (!) 161/89 (!) 157/100 (!) 174/96 139/86  Pulse: 75  75   Resp: (!) 23  (!) 22 (!) 22  Temp:   97.6 F (36.4 C)   TempSrc:   Oral   SpO2: 95%  98%   Weight:      Height:        Intake/Output Summary (Last 24 hours) at 07/20/2020 1301 Last data filed at 07/20/2020 1200 Gross per 24 hour  Intake 1601.2 ml  Output 2195 ml  Net -593.8 ml   Filed Weights   07/14/20 0033  Weight: 131.5 kg    Physical Exam   General: Well developed, well nourished, NAD Neck: Negative for carotid bruits. No JVD Lungs:Clear to ausculation bilaterally. No wheezes, rales, or rhonchi. Breathing is  unlabored. Cardiovascular: Irregularly irregular. No murmurs, rubs, gallops, or LV heave appreciated. Abdomen: Soft, non-tender, non-distended. No obvious abdominal masses. Extremities: Mild BLE edema. Radial pulses 2+ bilaterally Neuro: Alert and oriented. No focal deficits. No facial asymmetry. MAE spontaneously. Psych: Responds to questions appropriately with normal affect.    Labs    Chemistry Recent Labs  Lab 07/14/20 0101 07/14/20 0957 07/15/20 0510 07/16/20 0503 07/17/20 0310 07/18/20 0725 07/19/20 0115  NA 139   < > 135 138 137 139 138  K 3.3*   < > 4.8 4.3 4.0 3.9 3.8  CL 104  --  106 106 103 103 103  CO2 25  --  18* 21* 23 26 23   GLUCOSE 237*  --  122* 102* 107* 85 119*  BUN 17  --  34* 36* 22* 15 15  CREATININE 1.24  --  2.63* 1.84* 1.28* 0.97 0.98  CALCIUM 9.1  --  7.9* 7.8* 7.7* 8.2* 8.2*  PROT 8.1  --  5.1* 5.0*  --   --   --   ALBUMIN 4.5  --  2.6* 2.6*  --   --   --   AST 24  --  41 35  --   --   --   ALT 22  --  41 33  --   --   --   ALKPHOS 53  --  26* 28*  --   --   --  BILITOT 0.5  --  3.1* 1.4*  --   --   --   GFRNONAA >60  --  27* 42* >60 >60 >60  ANIONGAP 10  --  11 11 11 10 12    < > = values in this interval not displayed.     Hematology Recent Labs  Lab 07/18/20 0421 07/19/20 0115 07/20/20 0226  WBC 11.7* 9.6 10.9*  RBC 3.67* 3.71* 3.99*  HGB 11.3* 11.5* 12.1*  HCT 35.3* 34.3* 36.9*  MCV 96.2 92.5 92.5  MCH 30.8 31.0 30.3  MCHC 32.0 33.5 32.8  RDW 14.3 14.4 14.6  PLT PLATELET CLUMPS NOTED ON SMEAR, UNABLE TO ESTIMATE 201 194    Cardiac EnzymesNo results for input(s): TROPONINI in the last 168 hours. No results for input(s): TROPIPOC in the last 168 hours.   BNP Recent Labs  Lab 07/17/20 0247  BNP 112.2*     DDimer No results for input(s): DDIMER in the last 168 hours.   Radiology    DG UGI W SINGLE CM (SOL OR THIN BA)  Result Date: 07/20/2020 CLINICAL DATA:  Recent pneumoperitoneum. Surgical removal of laparoscopic band.  Rule out bowel leak. EXAM: WATER SOLUBLE UPPER GI SERIES TECHNIQUE: Single-column upper GI series was performed using water soluble contrast. CONTRAST:  341mL OMNIPAQUE IOHEXOL 300 MG/ML  SOLN COMPARISON:  CT abdomen and pelvis 07/14/2020 FLUOROSCOPY TIME:  Fluoroscopy Time:  1 minutes 6 second Radiation Exposure Index (if provided by the fluoroscopic device): Number of Acquired Spot Images: 10 FINDINGS: Preliminary KUB negative. NG tube is coiled in the stomach with the tip in the gastric antrum. 300 mL Omnipaque 300 was administered through the NG tube. The stomach appears normal without ulcer, mass or leak. Stomach empties normally into the duodenum which appears normal. No duodenal ulcer or leak. The esophagus was not evaluated. IMPRESSION: Water-soluble upper GI administered through NG tube reveals no abnormality the stomach or duodenum. Negative for leak. Electronically Signed   By: Franchot Gallo M.D.   On: 07/20/2020 12:49   [place] EKG SITE RITE  Result Date: 07/20/2020 If Site Rite image not attached, placement could not be confirmed due to current cardiac rhythm.   Telemetry    07/20/20 Atrial flutter with poor rate control in the 130-140s - Personally Reviewed  ECG    No new tracing as of 07/20/20 - Personally Reviewed  Cardiac Studies   Echo 07/15/2020 1. Left ventricular ejection fraction, by estimation, is 60 to 65%. The  left ventricle has normal function. The left ventricle has no regional  wall motion abnormalities. There is mild left ventricular hypertrophy.  Left ventricular diastolic parameters  are indeterminate.  2. Right ventricular systolic function is normal. The right ventricular  size is normal. Tricuspid regurgitation signal is inadequate for assessing  PA pressure.  3. The mitral valve is normal in structure. No evidence of mitral valve  regurgitation.  4. The aortic valve was not well visualized. Aortic valve regurgitation  is not visualized. No aortic  stenosis is present.   Patient Profile     59 y.o. male with a hx of palpitation, hypertension, prior lap band and history of kidney stone who is being seen today for the evaluation of atrial flutter at the request of Dr. Johnsie Cancel.  Assessment & Plan    1. Atrial flutter: -Felt to be precipitated by sepsis and bowel perforation -Anticoagulation intially deferred by surgery team due to extensive surgery and increased risk of bleeding -Continue IV beta blocker and amiodarone>>>rebolused  yesterday due to uncontrolled rates. Given poor rate control>>will plan to rebolus once again today.   -Would plan for possibleTEE/DCCV given persistent atrial flutter once able to take PO and can start Eliquis -IV Heparin per pharmacy today>>>ok with surgery team   2. S/p exploratory laparotomy with removal of lap band for pneumoperitoneum: -Management per surgery team  -Currently leaving for UGI study    Signed, Kathyrn Drown NP-C [place] Pager: [phone] 07/20/2020, 1:01 PM    For questions or updates, please contact   Please consult [email or web address] for contact info under Cardiology/STEMI.  Patient seen and examined.  Agree with above documentation.  On exam, patient is alert and oriented, regular rhythm, tachycardic, no murmurs, lungs CTAB, no LE edema.  Telemetry personally reviewed and shows atrial flutter, rates 90s to 150s, currently 130s.  For his atrial flutter,  agree with continuing IV metoprolol and amiodarone.  Suspect will be difficult to rate control and cardioversion will be needed, but no urgent indication as appears to be tolerating well.  Once able to take p.o. and tolerating Eliquis, will plan TEE/DCCV if still in atrial flutter.  Donato Heinz, MD

## 2020-07-20 NOTE — Progress Notes (Signed)
Physical Therapy Treatment Patient Details Name: Patrick Chapman MRN: [id number] DOB: 06/19/1961 Today's Date: 07/20/2020    History of Present Illness The pt is a 59 yo male presenting with severe left upper quadrant pain, found to have pneumoperitoneum. S/p ex lap and removal of lap-band on 12/07. Extubated 12/8. PMH includes: obesity, HTN, GERD, sleep apnea, with lap band in 2009.    PT Comments    The pt was seen in conjunction with OT with goal of progressing OOB activity at this time. However, the session was limited due to increases in BP with transition to sitting EOB that progressively worsened with continued seated rest. The pt was able to complete lateral scooting with modA to facilitate movement of hips, but was able to reposition in bed with minA only for line management. The pt will continue to benefit from skilled PT to progress functional strength, activity tolerance, and to initiate OOB transfers as appropriate.   BP during session: - supine prior to session: 158/98 (114) - supine at start of session: 174/96 (111) - sitting EOB: 185/95 (113) - sitting EOB (after 2 min): 190/134 (152)  - supine: 165/105 (120)   Follow Up Recommendations  Supervision for mobility/OOB;Home health PT     Equipment Recommendations   (defer until further gait OOB, possibly RW)    Recommendations for Other Services       Precautions / Restrictions Precautions Precautions: Fall Precaution Comments: NG tube, JP dain R lower abdomen, tachy Restrictions Weight Bearing Restrictions: No    Mobility  Bed Mobility Overal bed mobility: Needs Assistance Bed Mobility: Supine to Sit;Sit to Supine     Supine to sit: Min assist;+2 for physical assistance;HOB elevated Sit to supine: Mod assist;+2 for physical assistance   General bed mobility comments: Assist to elevate trunk with Min A. Mod A +2 for managing legs and trunk in return to supine.  Transfers Overall transfer level: Needs  assistance   Transfers: Lateral/Scoot Transfers          Lateral/Scoot Transfers: Mod assist;+2 physical assistance General transfer comment: Mod A +2 to laterally scoot towards HOB      Balance Overall balance assessment: Needs assistance Sitting-balance support: Single extremity supported;No upper extremity supported;Feet supported Sitting balance-Leahy Scale: Fair                                      Cognition Arousal/Alertness: Awake/alert Behavior During Therapy: WFL for tasks assessed/performed;Anxious Overall Cognitive Status: Impaired/Different from baseline                                 General Comments: Pt requiring increased time for processing. Slightly overwhelmed by increased information.      Exercises      General Comments General comments (skin integrity, edema, etc.): HR 140-150 at rest. BP supine 174/96 (111) and 185/95 (113). BP sitting at EOB 190/134 (152). BP supine 165/105 (120). SPO2 90s on RA.      Pertinent Vitals/Pain Pain Assessment: Faces Faces Pain Scale: Hurts little more Pain Location: abdomen Pain Descriptors / Indicators: Operative site guarding;Grimacing;Guarding Pain Intervention(s): Limited activity within patient's tolerance;Monitored during session;Repositioned           PT Goals (current goals can now be found in the care plan section) Acute Rehab PT Goals Patient Stated Goal: to get the NG tube out  PT Goal Formulation: With patient Time For Goal Achievement: 07/31/20 Potential to Achieve Goals: Good Progress towards PT goals: Progressing toward goals    Frequency    Min 4X/week      PT Plan Current plan remains appropriate    Co-evaluation PT/OT/SLP Co-Evaluation/Treatment: Yes Reason for Co-Treatment: Complexity of the patient's impairments (multi-system involvement);Necessary to address cognition/behavior during functional activity;To address functional/ADL transfers;For  patient/therapist safety PT goals addressed during session: Mobility/safety with mobility;Balance;Strengthening/ROM OT goals addressed during session: ADL's and self-care      AM-PAC PT "6 Clicks" Mobility   Outcome Measure  Help needed turning from your back to your side while in a flat bed without using bedrails?: A Little Help needed moving from lying on your back to sitting on the side of a flat bed without using bedrails?: A Lot Help needed moving to and from a bed to a chair (including a wheelchair)?: A Lot Help needed standing up from a chair using your arms (e.g., wheelchair or bedside chair)?: A Lot Help needed to walk in hospital room?: A Lot Help needed climbing 3-5 steps with a railing? : Total 6 Click Score: 12    End of Session   Activity Tolerance: Treatment limited secondary to medical complications (Comment) (elevated BP) Patient left: in bed;with call bell/phone within reach;with bed alarm set;with family/visitor present Nurse Communication: Mobility status (BP) PT Visit Diagnosis: Other abnormalities of gait and mobility (R26.89);Muscle weakness (generalized) (M62.81)     Time: 4585-9292 PT Time Calculation (min) (ACUTE ONLY): 26 min  Charges:  $Therapeutic Activity: 8-22 mins                     Karma Ganja, PT, DPT   Acute Rehabilitation Department Pager #: [phone]   Otho Bellows 07/20/2020, 6:06 PM

## 2020-07-20 NOTE — Procedures (Signed)
Central Venous Catheter Insertion Procedure Note  Patrick Chapman  [id number]  1961/06/25  Date:07/20/20  Time:1:42 PM   Provider Performing: Jesusita Oka   Procedure: Insertion of Non-tunneled Central Venous [phone]) with US guidance (09323)   Indication(s) Medication administration  Consent Risks of the procedure as well as the alternatives and risks of each were explained to the patient and/or caregiver.  Consent for the procedure was obtained and is signed in the bedside chart  Anesthesia Topical only with 1% lidocaine   Timeout Verified patient identification, verified procedure, site/side was marked, verified correct patient position, special equipment/implants available, medications/allergies/relevant history reviewed, required imaging and test results available.  Sterile Technique Maximal sterile technique including full sterile barrier drape, hand hygiene, sterile gown, sterile gloves, mask, hair covering, sterile ultrasound probe cover (if used).  Procedure Description Area of catheter insertion was cleaned with chlorhexidine and draped in sterile fashion.  With real-time ultrasound guidance a central venous catheter was placed into the right internal jugular vein. Nonpulsatile blood flow and easy flushing noted in all ports.  The catheter was sutured in place and sterile dressing applied.  Complications/Tolerance None; patient tolerated the procedure well. Chest X-ray is ordered to verify placement for internal jugular or subclavian cannulation.   Chest x-ray is not ordered for femoral cannulation.  EBL Minimal  Specimen(s) None

## 2020-07-21 DIAGNOSIS — I4892 Unspecified atrial flutter: Secondary | ICD-10-CM | POA: Diagnosis not present

## 2020-07-21 LAB — CBC
HCT: 37.8 % — ABNORMAL LOW (ref 39.0–52.0)
Hemoglobin: 12.3 g/dL — ABNORMAL LOW (ref 13.0–17.0)
MCH: 30.4 pg (ref 26.0–34.0)
MCHC: 32.5 g/dL (ref 30.0–36.0)
MCV: 93.6 fL (ref 80.0–100.0)
Platelets: 265 10*3/uL (ref 150–400)
RBC: 4.04 MIL/uL — ABNORMAL LOW (ref 4.22–5.81)
RDW: 14.9 % (ref 11.5–15.5)
WBC: 13.3 10*3/uL — ABNORMAL HIGH (ref 4.0–10.5)
nRBC: 0 % (ref 0.0–0.2)

## 2020-07-21 LAB — GLUCOSE, CAPILLARY
Glucose-Capillary: 104 mg/dL — ABNORMAL HIGH (ref 70–99)
Glucose-Capillary: 106 mg/dL — ABNORMAL HIGH (ref 70–99)
Glucose-Capillary: 108 mg/dL — ABNORMAL HIGH (ref 70–99)
Glucose-Capillary: 110 mg/dL — ABNORMAL HIGH (ref 70–99)
Glucose-Capillary: 82 mg/dL (ref 70–99)
Glucose-Capillary: 90 mg/dL (ref 70–99)

## 2020-07-21 LAB — HEPARIN LEVEL (UNFRACTIONATED): Heparin Unfractionated: 0.15 IU/mL — ABNORMAL LOW (ref 0.30–0.70)

## 2020-07-21 MED ORDER — TAMSULOSIN HCL 0.4 MG PO CAPS
0.4000 mg | ORAL_CAPSULE | Freq: Every day | ORAL | Status: DC
  Administered 2020-07-21 – 2020-07-24 (×4): 0.4 mg via ORAL
  Filled 2020-07-21 (×4): qty 1

## 2020-07-21 MED ORDER — METHOCARBAMOL 1000 MG/10ML IJ SOLN
1000.0000 mg | Freq: Three times a day (TID) | INTRAVENOUS | Status: DC | PRN
  Administered 2020-07-21 – 2020-07-23 (×3): 1000 mg via INTRAVENOUS
  Filled 2020-07-21 (×3): qty 10

## 2020-07-21 MED ORDER — APIXABAN 5 MG PO TABS
5.0000 mg | ORAL_TABLET | Freq: Two times a day (BID) | ORAL | Status: DC
  Administered 2020-07-21 – 2020-07-24 (×7): 5 mg via ORAL
  Filled 2020-07-21 (×7): qty 1

## 2020-07-21 MED ORDER — ENSURE MAX PROTEIN PO LIQD
11.0000 [oz_av] | Freq: Two times a day (BID) | ORAL | Status: DC
  Administered 2020-07-21: 11 [oz_av] via ORAL
  Filled 2020-07-21 (×3): qty 330

## 2020-07-21 MED ORDER — METOPROLOL TARTRATE 50 MG PO TABS
50.0000 mg | ORAL_TABLET | Freq: Two times a day (BID) | ORAL | Status: DC
  Administered 2020-07-21 – 2020-07-22 (×2): 50 mg via ORAL
  Filled 2020-07-21 (×2): qty 1

## 2020-07-21 NOTE — Progress Notes (Signed)
Nutrition Follow-up  DOCUMENTATION CODES:   Obesity unspecified  INTERVENTION:   Ensure Max po BID, each supplement provides 150 kcal and 30 grams of protein.   Encourage PO intake to meet nutrition needs for healing post op. Discussed starting a protein shake until intake is adequate and consistent.    NUTRITION DIAGNOSIS:   Increased nutrient needs related to post-op healing as evidenced by estimated needs. Ongoing.   GOAL:   Patient will meet greater than or equal to 90% of their needs Progressing.   MONITOR:   Diet advancement,I & O's  REASON FOR ASSESSMENT:   Rounds    ASSESSMENT:   Pt with PMH of GERD, HTN, obesity, sleep apnea, lap band 2009 and previous lap chole admitted with pneumoperitoneum.    Pt discussed during ICU rounds and with RN.  Pt's diet advanced to full liquids. Per pt he had his lap band surgery 9 years ago. He gained weight prior to his surgery. He has been on [employer] weight loss meal replacements for at least a year. He reports losing at least 70 lb during this time.  Pt reports ongoing chronic diarrhea. He states that he has been to specialists and no one knows for sure what is causing the diarrhea. Per pt he does not change how he eats because of the diarrhea. He states that he has had diarrhea since before his lap band surgery.     12/7 s/p ex lap, removal of lap band and components, application of wound VAC 12/8 NGT drained 2400 ml; extubated 12/13 s/p UGI; negative    Medications reviewed and include: SSI Labs reviewed   VAC: 435 ml      Diet Order:   Diet Order            Diet full liquid Room service appropriate? Yes; Fluid consistency: Thin  Diet effective now                 EDUCATION NEEDS:   No education needs have been identified at this time  Skin:  Skin Assessment:  (incision with VAC)  Last BM:  12/14 x 3 - type 7  Height:   Ht Readings from Last 1 Encounters:  07/14/20 6' (1.829 m)    Weight:    Wt Readings from Last 1 Encounters:  07/14/20 131.5 kg    Ideal Body Weight:  80.9 kg  BMI:  Body mass index is 39.33 kg/m.  Estimated Nutritional Needs:   Kcal:  2200-2500  Protein:  125-145 grams  Fluid:  >2 L/day  Lockie Pares., RD, LDN, CNSC See AMiON for contact information

## 2020-07-21 NOTE — Progress Notes (Signed)
ANTICOAGULATION CONSULT NOTE  Pharmacy Consult for Heparin>>Eliquis Indication: atrial fibrillation  Total Body Weight: 131.5 kg Height: 72 inches Heparin Dosing Weight: 107.4 kg  Labs: Recent Labs    07/19/20 0115 07/19/20 1848 07/20/20 0226 07/20/20 1326 07/20/20 2032 07/21/20 0421  HGB 11.5*  --  12.1*  --   --  12.3*  HCT 34.3*  --  36.9*  --   --  37.8*  PLT 201  --  194  --   --  265  HEPARINUNFRC  --    < > <0.10* 0.10* 0.11* 0.15*  CREATININE 0.98  --   --   --   --   --    < > = values in this interval not displayed.    Assessment: 59 yr old man admitted 12/7 with progressive upper abdominal pain, taken emergently for ex-lap, removal of lap band 12/7. Cardiology was consulted for aflutter with RVR, felt to be precipitated by sepsis and bowel perforation. Pharmacy was consulted to dose IV heparin. Patient was not on anticoagulation PTA.  Now to transition to Eliquis  Goal of Therapy:  Heparin level 0.3-0.5 units/ml Monitor platelets by anticoagulation protocol: Yes   Plan:  Stop heparin gtt Give Eliquis 5mg  PO BID Monitor s/s bleeding, cardioversion plans for this week  Bertis Ruddy, PharmD Clinical Pharmacist Please check AMION for all [place] numbers 07/21/2020 12:52 PM

## 2020-07-21 NOTE — Progress Notes (Signed)
Progress Note  Patient Name: Patrick Chapman Date of Encounter: 07/21/2020  Primary Cardiologist: Dr. Carlyle Dolly, MD  Subjective   No chest pain or dyspnea.  Tolerating clear liquid diet, able to take pills now  Inpatient Medications    Scheduled Meds: . sodium chloride   Intravenous Once  . chlorhexidine  15 mL Mouth Rinse BID  . Chlorhexidine Gluconate Cloth  6 each Topical Daily  . insulin aspart  0-15 Units Subcutaneous Q4H  . mouth rinse  15 mL Mouth Rinse q12n4p  . metoprolol tartrate  10 mg Intravenous Q6H  . pantoprazole (PROTONIX) IV  40 mg Intravenous Q12H  . sodium chloride flush  10-40 mL Intracatheter Q12H  . tamsulosin  0.4 mg Oral Daily   Continuous Infusions: . sodium chloride Stopped (07/18/20 0200)  . sodium chloride Stopped (07/16/20 1245)  . amiodarone 30 mg/hr (07/21/20 1100)  . dextrose 5% lactated ringers Stopped (07/20/20 0212)  . heparin 2,200 Units/hr (07/21/20 1100)  . methocarbamol (ROBAXIN) IV     PRN Meds: sodium chloride, sodium chloride, diphenhydrAMINE, ipratropium-albuterol, methocarbamol (ROBAXIN) IV, metoprolol tartrate, morphine injection, ondansetron (ZOFRAN) IV, phenol, sodium chloride flush   Vital Signs    Vitals:   07/21/20 0900 07/21/20 1000 07/21/20 1100 07/21/20 1200  BP: 127/81 135/82 (!) 149/81 (!) 143/83  Pulse: 74 (!) 56 67 89  Resp: (!) 23 17 (!) 21 19  Temp:      TempSrc:      SpO2: 99% 97% 97% 96%  Weight:      Height:        Intake/Output Summary (Last 24 hours) at 07/21/2020 1239 Last data filed at 07/21/2020 1100 Gross per 24 hour  Intake 1559.72 ml  Output 2190 ml  Net -630.28 ml   Filed Weights   07/14/20 0033  Weight: 131.5 kg    Physical Exam   General: Well developed, well nourished, NAD Neck: Negative for carotid bruits. No JVD Lungs:Clear to ausculation bilaterally. No wheezes, rales, or rhonchi. Breathing is unlabored. Cardiovascular: Irregular, tachycardic. No murmurs, rubs,  gallops, or LV heave appreciated. Abdomen: Soft, non-tender, non-distended. No obvious abdominal masses. Extremities: Trace BLE edema. Radial pulses 2+ bilaterally Neuro: Alert and oriented. No focal deficits. No facial asymmetry. MAE spontaneously. Psych: Responds to questions appropriately with normal affect.    Labs    Chemistry Recent Labs  Lab 07/15/20 0510 07/16/20 0503 07/17/20 0310 07/18/20 0725 07/19/20 0115  NA 135 138 137 139 138  K 4.8 4.3 4.0 3.9 3.8  CL 106 106 103 103 103  CO2 18* 21* 23 26 23   GLUCOSE 122* 102* 107* 85 119*  BUN 34* 36* 22* 15 15  CREATININE 2.63* 1.84* 1.28* 0.97 0.98  CALCIUM 7.9* 7.8* 7.7* 8.2* 8.2*  PROT 5.1* 5.0*  --   --   --   ALBUMIN 2.6* 2.6*  --   --   --   AST 41 35  --   --   --   ALT 41 33  --   --   --   ALKPHOS 26* 28*  --   --   --   BILITOT 3.1* 1.4*  --   --   --   GFRNONAA 27* 42* >60 >60 >60  ANIONGAP 11 11 11 10 12      Hematology Recent Labs  Lab 07/19/20 0115 07/20/20 0226 07/21/20 0421  WBC 9.6 10.9* 13.3*  RBC 3.71* 3.99* 4.04*  HGB 11.5* 12.1* 12.3*  HCT  34.3* 36.9* 37.8*  MCV 92.5 92.5 93.6  MCH 31.0 30.3 30.4  MCHC 33.5 32.8 32.5  RDW 14.4 14.6 14.9  PLT 201 194 265    Cardiac EnzymesNo results for input(s): TROPONINI in the last 168 hours. No results for input(s): TROPIPOC in the last 168 hours.   BNP Recent Labs  Lab 07/17/20 0247  BNP 112.2*     DDimer No results for input(s): DDIMER in the last 168 hours.   Radiology    DG Chest Port 1 View  Result Date: 07/20/2020 CLINICAL DATA:  Respiratory failure EXAM: PORTABLE CHEST 1 VIEW COMPARISON:  07/14/2020 FINDINGS: Interval extubation. Enteric tube and right IJ central line remain present. There is contrast opacifying the stomach from recent fluoroscopy study. Low lung volumes with bibasilar atelectasis. Stable cardiomediastinal contours with top-normal heart size. No significant pleural effusion. No pneumothorax. IMPRESSION: Low lung  volumes and bibasilar atelectasis. Electronically Signed   By: Macy Mis M.D.   On: 07/20/2020 14:02   DG UGI W SINGLE CM (SOL OR THIN BA)  Result Date: 07/20/2020 CLINICAL DATA:  Recent pneumoperitoneum. Surgical removal of laparoscopic band. Rule out bowel leak. EXAM: WATER SOLUBLE UPPER GI SERIES TECHNIQUE: Single-column upper GI series was performed using water soluble contrast. CONTRAST:  367mL OMNIPAQUE IOHEXOL 300 MG/ML  SOLN COMPARISON:  CT abdomen and pelvis 07/14/2020 FLUOROSCOPY TIME:  Fluoroscopy Time:  1 minutes 6 second Radiation Exposure Index (if provided by the fluoroscopic device): Number of Acquired Spot Images: 10 FINDINGS: Preliminary KUB negative. NG tube is coiled in the stomach with the tip in the gastric antrum. 300 mL Omnipaque 300 was administered through the NG tube. The stomach appears normal without ulcer, mass or leak. Stomach empties normally into the duodenum which appears normal. No duodenal ulcer or leak. The esophagus was not evaluated. IMPRESSION: Water-soluble upper GI administered through NG tube reveals no abnormality the stomach or duodenum. Negative for leak. Electronically Signed   By: Franchot Gallo M.D.   On: 07/20/2020 12:49   [place] EKG SITE RITE  Result Date: 07/20/2020 If Site Rite image not attached, placement could not be confirmed due to current cardiac rhythm.   Telemetry    07/20/20 Atrial flutter with rates in 130s- Personally Reviewed  ECG    No new tracing as of 07/20/20 - Personally Reviewed  Cardiac Studies   Echo 07/15/2020 1. Left ventricular ejection fraction, by estimation, is 60 to 65%. The  left ventricle has normal function. The left ventricle has no regional  wall motion abnormalities. There is mild left ventricular hypertrophy.  Left ventricular diastolic parameters  are indeterminate.  2. Right ventricular systolic function is normal. The right ventricular  size is normal. Tricuspid regurgitation signal is inadequate  for assessing  PA pressure.  3. The mitral valve is normal in structure. No evidence of mitral valve  regurgitation.  4. The aortic valve was not well visualized. Aortic valve regurgitation  is not visualized. No aortic stenosis is present.   Patient Profile     59 y.o. male with a hx of palpitation, hypertension, prior lap band and history of kidney stone who is being seen today for the evaluation of atrial flutter at the request of Dr. Johnsie Cancel.  Assessment & Plan    1. Atrial flutter: -Felt to be precipitated by sepsis and bowel perforation -Able to take PO, will discontinue heparin gtt and start Eliquis 5 mg BID -Continue IV amiodarone.  Able to take PO now, will start metoprolol 50  mg BID -Would plan for TEE/DCCV given persistent atrial flutter.  If able to tolerate PO and take Eliquis today and tomorrow, will plan for TEE/DCCV on 12/16   2. S/p exploratory laparotomy with removal of lap band for pneumoperitoneum: -Management per surgery team   For questions or updates, please contact   Please consult [email or web address] for contact info under Cardiology/STEMI.  Donato Heinz, MD

## 2020-07-21 NOTE — Progress Notes (Signed)
Physical Therapy Treatment Patient Details Name: Patrick Chapman MRN: [id number] DOB: Jun 22, 1961 Today's Date: 07/21/2020    History of Present Illness The pt is a 59 yo male presenting with severe left upper quadrant pain, found to have pneumoperitoneum. S/p ex lap and removal of lap-band on 12/07. Extubated 12/8. PMH includes: obesity, HTN, GERD, sleep apnea, with lap band in 2009.    PT Comments    Continuing work on functional mobility and activity tolerance, with noteworthy progress; Able to get OOB to recliner with min assist and second person to help manage lines/leads; HR extremely variable throughout session, noted plans for cardiac conversion on Thursday; Plan to focus on progressive amb as vitals permit next session; will start with RW  Follow Up Recommendations  Supervision for mobility/OOB;Home health PT     Equipment Recommendations  Rolling walker with 5" wheels (will consider Bari RW)    Recommendations for Other Services       Precautions / Restrictions Precautions Precautions: Fall Precaution Comments: JP dain R lower abdomen, tachy    Mobility  Bed Mobility Overal bed mobility: Needs Assistance Bed Mobility: Supine to Sit     Supine to sit: Min assist (second person fo rlines/leads)     General bed mobility comments: Hand held assist to pull to sit  Transfers Overall transfer level: Needs assistance Equipment used: 2 person hand held assist   Sit to Stand: Min assist;+2 safety/equipment         General transfer comment: Cues to scoot to EOB in prep for standing; min assist to steady; Cues to self-monitor for activity tolerance  Ambulation/Gait Ambulation/Gait assistance: Min assist;+2 safety/equipment Gait Distance (Feet):  (Pivotal steps bed to chair) Assistive device: 2 person hand held assist       General Gait Details: small lateral steps to recliner with BUE support through [facility] for management of lines and stability. pt able to move BLE  without buckling or assistq   Stairs             Wheelchair Mobility    Modified Rankin (Stroke Patients Only)       Balance     Sitting balance-Leahy Scale: Fair       Standing balance-Leahy Scale: Poor (approaching Fair)                              Cognition Arousal/Alertness: Awake/alert Behavior During Therapy: WFL for tasks assessed/performed;Anxious Overall Cognitive Status: Within Functional Limits for tasks assessed                                        Exercises      General Comments General comments (skin integrity, edema, etc.): Session conducted on Room Air, and sats stayed at safe levels; HR extremely variable, ranging form 120 to 157 bpm (observed highest); BP 121/93, in bed at beginning of session, 107/92 seated in recliner end of session; Mildly lightheaded      Pertinent Vitals/Pain Pain Assessment: Faces Faces Pain Scale: Hurts a little bit Pain Location: abdomen Pain Descriptors / Indicators: Operative site guarding;Grimacing;Guarding (itching) Pain Intervention(s): Monitored during session;Premedicated before session    Home Living                      Prior Function  PT Goals (current goals can now be found in the care plan section) Acute Rehab PT Goals Patient Stated Goal: Agreeable to getting OOB PT Goal Formulation: With patient Time For Goal Achievement: 07/31/20 Potential to Achieve Goals: Good Progress towards PT goals: Progressing toward goals    Frequency    Min 4X/week      PT Plan Current plan remains appropriate    Co-evaluation              AM-PAC PT "6 Clicks" Mobility   Outcome Measure  Help needed turning from your back to your side while in a flat bed without using bedrails?: None Help needed moving from lying on your back to sitting on the side of a flat bed without using bedrails?: A Little Help needed moving to and from a bed to a chair  (including a wheelchair)?: A Little Help needed standing up from a chair using your arms (e.g., wheelchair or bedside chair)?: A Little Help needed to walk in hospital room?: A Little Help needed climbing 3-5 steps with a railing? : A Lot 6 Click Score: 18    End of Session   Activity Tolerance: Patient tolerated treatment well Patient left: in chair;with call bell/phone within reach;with family/visitor present Nurse Communication: Mobility status PT Visit Diagnosis: Other abnormalities of gait and mobility (R26.89);Muscle weakness (generalized) (M62.81)     Time: 5009-3818 PT Time Calculation (min) (ACUTE ONLY): 28 min  Charges:  $Therapeutic Activity: 23-37 mins                     Roney Marion, PT  Acute Rehabilitation Services Pager [phone] Office [place] 07/21/2020, 4:24 PM

## 2020-07-21 NOTE — Progress Notes (Addendum)
TEE/DCCV scheduled 12/16 @ 10am with Dr. Acie Fredrickson. Orders done. Consent by rounding team tomorrow.

## 2020-07-21 NOTE — Progress Notes (Signed)
Trauma/Critical Care Follow Up Note  Subjective:    Overnight Issues:   Objective:  Vital signs for last 24 hours: Temp:  [97.6 F (36.4 C)-100.1 F (37.8 C)] 98.6 F (37 C) (12/14 0400) Pulse Rate:  [29-153] 84 (12/14 0600) Resp:  [17-31] 19 (12/14 0600) BP: (112-174)/(73-100) 132/91 (12/14 0600) SpO2:  [92 %-98 %] 95 % (12/14 0600)  Hemodynamic parameters for last 24 hours:    Intake/Output from previous day: 12/13 0701 - 12/14 0700 In: 1066.1 [I.V.:866.1; IV Piggyback:200] Out: 1735 [Urine:850; Emesis/NG output:500; Drains:385]  Intake/Output this shift: Total I/O In: 521.5 [I.V.:421.5; IV Piggyback:100] Out: 330 [Urine:250; Drains:80]  Vent settings for last 24 hours:    Physical Exam:  Gen: comfortable, no distress Neuro: non-focal exam HEENT: PERRL Neck: supple CV: RRR Pulm: unlabored breathing Abd: soft, NT, JP in place with 385cc SS o/p GU: Foley Extr: wwp, no edema   Results for orders placed or performed during the hospital encounter of 07/14/20 (from the past 24 hour(s))  Glucose, capillary     Status: Abnormal   Collection Time: 07/20/20  8:05 AM  Result Value Ref Range   Glucose-Capillary 115 (H) 70 - 99 mg/dL  Glucose, capillary     Status: Abnormal   Collection Time: 07/20/20 11:56 AM  Result Value Ref Range   Glucose-Capillary 100 (H) 70 - 99 mg/dL  Heparin level (unfractionated)     Status: Abnormal   Collection Time: 07/20/20  1:26 PM  Result Value Ref Range   Heparin Unfractionated 0.10 (L) 0.30 - 0.70 IU/mL  Glucose, capillary     Status: Abnormal   Collection Time: 07/20/20  4:18 PM  Result Value Ref Range   Glucose-Capillary 104 (H) 70 - 99 mg/dL  Glucose, capillary     Status: None   Collection Time: 07/20/20  7:51 PM  Result Value Ref Range   Glucose-Capillary 94 70 - 99 mg/dL  Heparin level (unfractionated)     Status: Abnormal   Collection Time: 07/20/20  8:32 PM  Result Value Ref Range   Heparin Unfractionated 0.11 (L)  0.30 - 0.70 IU/mL  Glucose, capillary     Status: Abnormal   Collection Time: 07/20/20 11:48 PM  Result Value Ref Range   Glucose-Capillary 117 (H) 70 - 99 mg/dL  Glucose, capillary     Status: Abnormal   Collection Time: 07/21/20  3:53 AM  Result Value Ref Range   Glucose-Capillary 108 (H) 70 - 99 mg/dL  CBC     Status: Abnormal   Collection Time: 07/21/20  4:21 AM  Result Value Ref Range   WBC 13.3 (H) 4.0 - 10.5 K/uL   RBC 4.04 (L) 4.22 - 5.81 MIL/uL   Hemoglobin 12.3 (L) 13.0 - 17.0 g/dL   HCT 37.8 (L) 39.0 - 52.0 %   MCV 93.6 80.0 - 100.0 fL   MCH 30.4 26.0 - 34.0 pg   MCHC 32.5 30.0 - 36.0 g/dL   RDW 14.9 11.5 - 15.5 %   Platelets 265 150 - 400 K/uL   nRBC 0.0 0.0 - 0.2 %  Heparin level (unfractionated)     Status: Abnormal   Collection Time: 07/21/20  4:21 AM  Result Value Ref Range   Heparin Unfractionated 0.15 (L) 0.30 - 0.70 IU/mL    Assessment & Plan:  Present on Admission: . Pneumoperitoneum    LOS: 7 days   Additional comments:I reviewed the patient's new clinical lab test results.   and I reviewed the patients new  imaging test results.     POD7s/p ex lap with removal of lap band for pneumoperitoneum - source near his lap band with bubbling under water so this was removed - UGI 12/13 negative for leak - tolerated clamping trial with sips yesterday, +BM, advance to FLD today - VAC change MWF - cont JP drain until tolerating diet and output slows (385cc in 24h) - PT/OT, encouraged mobilization - pulm toilet and IS Aflutter - cont heparin gtt, amiodarone. Not therapeutic on hep gtt and rate still poorly controlled. Plan for TEE/cardioversion later this week after 1-2d of DOAC. Okay to initiate DOAC today from surgery standpoint.  FEN-FLD/IVF, NGT out today ID - s/p [id number] Foley - restart home flomax today, Foley out 12/15   Jesusita Oka, MD Trauma & General Surgery Please use [email or web address] to contact on call provider  07/21/2020  *Care  during the described time interval was provided by me. I have reviewed this patient's available data, including medical history, events of note, physical examination and test results as part of my evaluation.

## 2020-07-21 NOTE — Progress Notes (Signed)
ANTICOAGULATION CONSULT NOTE  Pharmacy Consult for Heparin Indication: atrial fibrillation  Total Body Weight: 131.5 kg Height: 72 inches Heparin Dosing Weight: 107.4 kg  Labs: Recent Labs     0000 07/18/20 0725 07/19/20 0115 07/19/20 1848 07/20/20 0226 07/20/20 1326 07/20/20 2032 07/21/20 0421  HGB   < >  --  11.5*  --  12.1*  --   --  12.3*  HCT  --   --  34.3*  --  36.9*  --   --  37.8*  PLT  --   --  201  --  194  --   --  265  HEPARINUNFRC  --   --   --    < > <0.10* 0.10* 0.11* 0.15*  CREATININE  --  0.97 0.98  --   --   --   --   --    < > = values in this interval not displayed.    Assessment: 59 yr old man admitted 12/7 with progressive upper abdominal pain, taken emergently for ex-lap, removal of lap band 12/7. Cardiology was consulted for aflutter with RVR, felt to be precipitated by sepsis and bowel perforation. Pharmacy was consulted to dose IV heparin. Patient was not on anticoagulation PTA.  Heparin level subtherapeutic (0.15) on gtt at 2000 units/hr. No issues with line or bleeding reported per RN.  Goal of Therapy:  Heparin level 0.3-0.5 units/ml Monitor platelets by anticoagulation protocol: Yes   Plan:  Increase heparin infusion to 2200 units/hr Will f/u 6 hr heparin level  Sherlon Handing, PharmD, BCPS Please see amion for complete clinical pharmacist phone list 07/21/2020 4:54 AM

## 2020-07-22 ENCOUNTER — Inpatient Hospital Stay (HOSPITAL_COMMUNITY): Payer: PRIVATE HEALTH INSURANCE

## 2020-07-22 DIAGNOSIS — I4892 Unspecified atrial flutter: Secondary | ICD-10-CM | POA: Diagnosis not present

## 2020-07-22 LAB — GLUCOSE, CAPILLARY
Glucose-Capillary: 103 mg/dL — ABNORMAL HIGH (ref 70–99)
Glucose-Capillary: 112 mg/dL — ABNORMAL HIGH (ref 70–99)
Glucose-Capillary: 113 mg/dL — ABNORMAL HIGH (ref 70–99)
Glucose-Capillary: 125 mg/dL — ABNORMAL HIGH (ref 70–99)
Glucose-Capillary: 129 mg/dL — ABNORMAL HIGH (ref 70–99)
Glucose-Capillary: 135 mg/dL — ABNORMAL HIGH (ref 70–99)

## 2020-07-22 LAB — CBC
HCT: 34.8 % — ABNORMAL LOW (ref 39.0–52.0)
Hemoglobin: 10.9 g/dL — ABNORMAL LOW (ref 13.0–17.0)
MCH: 29.7 pg (ref 26.0–34.0)
MCHC: 31.3 g/dL (ref 30.0–36.0)
MCV: 94.8 fL (ref 80.0–100.0)
Platelets: 300 10*3/uL (ref 150–400)
RBC: 3.67 MIL/uL — ABNORMAL LOW (ref 4.22–5.81)
RDW: 15.1 % (ref 11.5–15.5)
WBC: 13.9 10*3/uL — ABNORMAL HIGH (ref 4.0–10.5)
nRBC: 0 % (ref 0.0–0.2)

## 2020-07-22 MED ORDER — METOPROLOL TARTRATE 50 MG PO TABS
75.0000 mg | ORAL_TABLET | Freq: Two times a day (BID) | ORAL | Status: DC
  Administered 2020-07-22 – 2020-07-23 (×2): 75 mg via ORAL
  Filled 2020-07-22 (×2): qty 1

## 2020-07-22 MED ORDER — KCL IN DEXTROSE-NACL 20-5-0.45 MEQ/L-%-% IV SOLN
INTRAVENOUS | Status: DC
  Filled 2020-07-22 (×3): qty 1000

## 2020-07-22 MED ORDER — PROCHLORPERAZINE EDISYLATE 10 MG/2ML IJ SOLN
10.0000 mg | Freq: Four times a day (QID) | INTRAMUSCULAR | Status: DC | PRN
  Administered 2020-07-22: 10 mg via INTRAVENOUS
  Filled 2020-07-22 (×2): qty 2

## 2020-07-22 MED ORDER — ALUM & MAG HYDROXIDE-SIMETH 200-200-20 MG/5ML PO SUSP
30.0000 mL | Freq: Four times a day (QID) | ORAL | Status: DC | PRN
  Administered 2020-07-22 – 2020-07-24 (×3): 30 mL via ORAL
  Filled 2020-07-22 (×3): qty 30

## 2020-07-22 NOTE — H&P (View-Only) (Signed)
Progress Note  Patient Name: Patrick Chapman Date of Encounter: 07/22/2020  Primary Cardiologist: Dr. Carlyle Dolly, MD  Subjective   No chest pain or dyspnea.  Tolerating clear liquid diet, able to take pills now  Inpatient Medications    Scheduled Meds: . sodium chloride   Intravenous Once  . apixaban  5 mg Oral BID  . chlorhexidine  15 mL Mouth Rinse BID  . Chlorhexidine Gluconate Cloth  6 each Topical Daily  . insulin aspart  0-15 Units Subcutaneous Q4H  . mouth rinse  15 mL Mouth Rinse q12n4p  . metoprolol tartrate  50 mg Oral BID  . pantoprazole (PROTONIX) IV  40 mg Intravenous Q12H  . sodium chloride flush  10-40 mL Intracatheter Q12H  . tamsulosin  0.4 mg Oral Daily   Continuous Infusions: . sodium chloride Stopped (07/18/20 0200)  . sodium chloride Stopped (07/16/20 1245)  . amiodarone 30 mg/hr (07/22/20 1100)  . dextrose 5 % and 0.45 % NaCl with KCl 20 mEq/L 75 mL/hr at 07/22/20 1100  . methocarbamol (ROBAXIN) IV Stopped (07/22/20 0203)   PRN Meds: sodium chloride, sodium chloride, alum & mag hydroxide-simeth, diphenhydrAMINE, ipratropium-albuterol, methocarbamol (ROBAXIN) IV, metoprolol tartrate, morphine injection, ondansetron (ZOFRAN) IV, phenol, prochlorperazine, sodium chloride flush   Vital Signs    Vitals:   07/22/20 0800 07/22/20 0900 07/22/20 1000 07/22/20 1100  BP: 119/76 (!) 121/91 125/76 139/85  Pulse: (!) 155 (!) 143 (!) 140 (!) 153  Resp: (!) 23 (!) 22 20 (!) 24  Temp: 99.3 F (37.4 C)     TempSrc: Oral     SpO2: 96% 93% 94% 95%  Weight:      Height:        Intake/Output Summary (Last 24 hours) at 07/22/2020 1229 Last data filed at 07/22/2020 1100 Gross per 24 hour  Intake 728.11 ml  Output 780 ml  Net -51.89 ml   Filed Weights   07/14/20 0033  Weight: 131.5 kg    Physical Exam   General: Well developed, well nourished, NAD Neck: Negative for carotid bruits. No JVD Lungs:Clear to ausculation bilaterally. No wheezes,  rales, or rhonchi. Breathing is unlabored. Cardiovascular: Irregular, tachycardic. No murmurs, rubs, gallops, or LV heave appreciated. Abdomen: Soft, non-tender, non-distended. No obvious abdominal masses. Extremities: Trace BLE edema. Radial pulses 2+ bilaterally Neuro: Alert and oriented. No focal deficits. No facial asymmetry. MAE spontaneously. Psych: Responds to questions appropriately with normal affect.    Labs    Chemistry Recent Labs  Lab 07/16/20 0503 07/17/20 0310 07/18/20 0725 07/19/20 0115  NA 138 137 139 138  K 4.3 4.0 3.9 3.8  CL 106 103 103 103  CO2 21* 23 26 23   GLUCOSE 102* 107* 85 119*  BUN 36* 22* 15 15  CREATININE 1.84* 1.28* 0.97 0.98  CALCIUM 7.8* 7.7* 8.2* 8.2*  PROT 5.0*  --   --   --   ALBUMIN 2.6*  --   --   --   AST 35  --   --   --   ALT 33  --   --   --   ALKPHOS 28*  --   --   --   BILITOT 1.4*  --   --   --   GFRNONAA 42* >60 >60 >60  ANIONGAP 11 11 10 12      Hematology Recent Labs  Lab 07/20/20 0226 07/21/20 0421 07/22/20 0500  WBC 10.9* 13.3* 13.9*  RBC 3.99* 4.04* 3.67*  HGB 12.1*  12.3* 10.9*  HCT 36.9* 37.8* 34.8*  MCV 92.5 93.6 94.8  MCH 30.3 30.4 29.7  MCHC 32.8 32.5 31.3  RDW 14.6 14.9 15.1  PLT 194 265 300    Cardiac EnzymesNo results for input(s): TROPONINI in the last 168 hours. No results for input(s): TROPIPOC in the last 168 hours.   BNP Recent Labs  Lab 07/17/20 0247  BNP 112.2*     DDimer No results for input(s): DDIMER in the last 168 hours.   Radiology    DG Chest Port 1 View  Result Date: 07/20/2020 CLINICAL DATA:  Respiratory failure EXAM: PORTABLE CHEST 1 VIEW COMPARISON:  07/14/2020 FINDINGS: Interval extubation. Enteric tube and right IJ central line remain present. There is contrast opacifying the stomach from recent fluoroscopy study. Low lung volumes with bibasilar atelectasis. Stable cardiomediastinal contours with top-normal heart size. No significant pleural effusion. No pneumothorax.  IMPRESSION: Low lung volumes and bibasilar atelectasis. Electronically Signed   By: Macy Mis M.D.   On: 07/20/2020 14:02   DG Abd Portable 1V  Result Date: 07/22/2020 CLINICAL DATA:  Postoperative ileus EXAM: PORTABLE ABDOMEN - 1 VIEW COMPARISON:  July 20, 2020 FINDINGS: Stomach is diffusely distended with air. There are loops of dilated small bowel without air-fluid levels appreciable. A small amount of contrast is seen in the right colon. No free air appreciable on supine examination. Surgical clips in right upper abdomen. Lung bases are clear. IMPRESSION: Gastric distension with air. Loops of dilated small bowel, likely representing a degree of ileus. Contrast noted in right colon. No free air evident on supine examination. Lung bases clear. Electronically Signed   By: Lowella Grip III M.D.   On: 07/22/2020 11:20    Telemetry    07/20/20 Atrial flutter with rates in 130s- Personally Reviewed  ECG    No new tracing as of 07/20/20 - Personally Reviewed  Cardiac Studies   Echo 07/15/2020 1. Left ventricular ejection fraction, by estimation, is 60 to 65%. The  left ventricle has normal function. The left ventricle has no regional  wall motion abnormalities. There is mild left ventricular hypertrophy.  Left ventricular diastolic parameters  are indeterminate.  2. Right ventricular systolic function is normal. The right ventricular  size is normal. Tricuspid regurgitation signal is inadequate for assessing  PA pressure.  3. The mitral valve is normal in structure. No evidence of mitral valve  regurgitation.  4. The aortic valve was not well visualized. Aortic valve regurgitation  is not visualized. No aortic stenosis is present.   Patient Profile     59 y.o. male with a hx of palpitation, hypertension, prior lap band and history of kidney stone who is being seen today for the evaluation of atrial flutter at the request of Dr. Johnsie Cancel.  Assessment & Plan    1.  Atrial flutter: -Felt to be precipitated by sepsis and bowel perforation -Continue Eliquis 5 mg BID -Continue IV amiodarone.  Continue metoprolol, will increase to 75 mg BID -Would plan for TEE/DCCV given persistent atrial flutter.  Scheduled for tomorrow if continues to be able to tolerate PO and take Eliquis.  After careful review of history and examination, the risks and benefits of transesophageal echocardiogram have been explained including risks of esophageal damage, perforation (1:10,000 risk), bleeding, pharyngeal hematoma as well as other potential complications associated with conscious sedation including aspiration, arrhythmia, respiratory failure and death. Alternatives to treatment were discussed, questions were answered. Patient is willing to proceed.    2. S/p exploratory  laparotomy with removal of lap band for pneumoperitoneum: -Management per surgery team   For questions or updates, please contact   Please consult [email or web address] for contact info under Cardiology/STEMI.  Donato Heinz, MD

## 2020-07-22 NOTE — Progress Notes (Signed)
Progress Note  Patient Name: Patrick Chapman Date of Encounter: 07/22/2020  Primary Cardiologist: Dr. Carlyle Dolly, MD  Subjective   No chest pain or dyspnea.  Tolerating clear liquid diet, able to take pills now  Inpatient Medications    Scheduled Meds: . sodium chloride   Intravenous Once  . apixaban  5 mg Oral BID  . chlorhexidine  15 mL Mouth Rinse BID  . Chlorhexidine Gluconate Cloth  6 each Topical Daily  . insulin aspart  0-15 Units Subcutaneous Q4H  . mouth rinse  15 mL Mouth Rinse q12n4p  . metoprolol tartrate  50 mg Oral BID  . pantoprazole (PROTONIX) IV  40 mg Intravenous Q12H  . sodium chloride flush  10-40 mL Intracatheter Q12H  . tamsulosin  0.4 mg Oral Daily   Continuous Infusions: . sodium chloride Stopped (07/18/20 0200)  . sodium chloride Stopped (07/16/20 1245)  . amiodarone 30 mg/hr (07/22/20 1100)  . dextrose 5 % and 0.45 % NaCl with KCl 20 mEq/L 75 mL/hr at 07/22/20 1100  . methocarbamol (ROBAXIN) IV Stopped (07/22/20 0203)   PRN Meds: sodium chloride, sodium chloride, alum & mag hydroxide-simeth, diphenhydrAMINE, ipratropium-albuterol, methocarbamol (ROBAXIN) IV, metoprolol tartrate, morphine injection, ondansetron (ZOFRAN) IV, phenol, prochlorperazine, sodium chloride flush   Vital Signs    Vitals:   07/22/20 0800 07/22/20 0900 07/22/20 1000 07/22/20 1100  BP: 119/76 (!) 121/91 125/76 139/85  Pulse: (!) 155 (!) 143 (!) 140 (!) 153  Resp: (!) 23 (!) 22 20 (!) 24  Temp: 99.3 F (37.4 C)     TempSrc: Oral     SpO2: 96% 93% 94% 95%  Weight:      Height:        Intake/Output Summary (Last 24 hours) at 07/22/2020 1229 Last data filed at 07/22/2020 1100 Gross per 24 hour  Intake 728.11 ml  Output 780 ml  Net -51.89 ml   Filed Weights   07/14/20 0033  Weight: 131.5 kg    Physical Exam   General: Well developed, well nourished, NAD Neck: Negative for carotid bruits. No JVD Lungs:Clear to ausculation bilaterally. No wheezes,  rales, or rhonchi. Breathing is unlabored. Cardiovascular: Irregular, tachycardic. No murmurs, rubs, gallops, or LV heave appreciated. Abdomen: Soft, non-tender, non-distended. No obvious abdominal masses. Extremities: Trace BLE edema. Radial pulses 2+ bilaterally Neuro: Alert and oriented. No focal deficits. No facial asymmetry. MAE spontaneously. Psych: Responds to questions appropriately with normal affect.    Labs    Chemistry Recent Labs  Lab 07/16/20 0503 07/17/20 0310 07/18/20 0725 07/19/20 0115  NA 138 137 139 138  K 4.3 4.0 3.9 3.8  CL 106 103 103 103  CO2 21* 23 26 23   GLUCOSE 102* 107* 85 119*  BUN 36* 22* 15 15  CREATININE 1.84* 1.28* 0.97 0.98  CALCIUM 7.8* 7.7* 8.2* 8.2*  PROT 5.0*  --   --   --   ALBUMIN 2.6*  --   --   --   AST 35  --   --   --   ALT 33  --   --   --   ALKPHOS 28*  --   --   --   BILITOT 1.4*  --   --   --   GFRNONAA 42* >60 >60 >60  ANIONGAP 11 11 10 12      Hematology Recent Labs  Lab 07/20/20 0226 07/21/20 0421 07/22/20 0500  WBC 10.9* 13.3* 13.9*  RBC 3.99* 4.04* 3.67*  HGB 12.1*  12.3* 10.9*  HCT 36.9* 37.8* 34.8*  MCV 92.5 93.6 94.8  MCH 30.3 30.4 29.7  MCHC 32.8 32.5 31.3  RDW 14.6 14.9 15.1  PLT 194 265 300    Cardiac EnzymesNo results for input(s): TROPONINI in the last 168 hours. No results for input(s): TROPIPOC in the last 168 hours.   BNP Recent Labs  Lab 07/17/20 0247  BNP 112.2*     DDimer No results for input(s): DDIMER in the last 168 hours.   Radiology    DG Chest Port 1 View  Result Date: 07/20/2020 CLINICAL DATA:  Respiratory failure EXAM: PORTABLE CHEST 1 VIEW COMPARISON:  07/14/2020 FINDINGS: Interval extubation. Enteric tube and right IJ central line remain present. There is contrast opacifying the stomach from recent fluoroscopy study. Low lung volumes with bibasilar atelectasis. Stable cardiomediastinal contours with top-normal heart size. No significant pleural effusion. No pneumothorax.  IMPRESSION: Low lung volumes and bibasilar atelectasis. Electronically Signed   By: Macy Mis M.D.   On: 07/20/2020 14:02   DG Abd Portable 1V  Result Date: 07/22/2020 CLINICAL DATA:  Postoperative ileus EXAM: PORTABLE ABDOMEN - 1 VIEW COMPARISON:  July 20, 2020 FINDINGS: Stomach is diffusely distended with air. There are loops of dilated small bowel without air-fluid levels appreciable. A small amount of contrast is seen in the right colon. No free air appreciable on supine examination. Surgical clips in right upper abdomen. Lung bases are clear. IMPRESSION: Gastric distension with air. Loops of dilated small bowel, likely representing a degree of ileus. Contrast noted in right colon. No free air evident on supine examination. Lung bases clear. Electronically Signed   By: Lowella Grip III M.D.   On: 07/22/2020 11:20    Telemetry    07/20/20 Atrial flutter with rates in 130s- Personally Reviewed  ECG    No new tracing as of 07/20/20 - Personally Reviewed  Cardiac Studies   Echo 07/15/2020 1. Left ventricular ejection fraction, by estimation, is 60 to 65%. The  left ventricle has normal function. The left ventricle has no regional  wall motion abnormalities. There is mild left ventricular hypertrophy.  Left ventricular diastolic parameters  are indeterminate.  2. Right ventricular systolic function is normal. The right ventricular  size is normal. Tricuspid regurgitation signal is inadequate for assessing  PA pressure.  3. The mitral valve is normal in structure. No evidence of mitral valve  regurgitation.  4. The aortic valve was not well visualized. Aortic valve regurgitation  is not visualized. No aortic stenosis is present.   Patient Profile     59 y.o. male with a hx of palpitation, hypertension, prior lap band and history of kidney stone who is being seen today for the evaluation of atrial flutter at the request of Dr. Johnsie Cancel.  Assessment & Plan    1.  Atrial flutter: -Felt to be precipitated by sepsis and bowel perforation -Continue Eliquis 5 mg BID -Continue IV amiodarone.  Continue metoprolol, will increase to 75 mg BID -Would plan for TEE/DCCV given persistent atrial flutter.  Scheduled for tomorrow if continues to be able to tolerate PO and take Eliquis.  After careful review of history and examination, the risks and benefits of transesophageal echocardiogram have been explained including risks of esophageal damage, perforation (1:10,000 risk), bleeding, pharyngeal hematoma as well as other potential complications associated with conscious sedation including aspiration, arrhythmia, respiratory failure and death. Alternatives to treatment were discussed, questions were answered. Patient is willing to proceed.    2. S/p exploratory  laparotomy with removal of lap band for pneumoperitoneum: -Management per surgery team   For questions or updates, please contact   Please consult [email or web address] for contact info under Cardiology/STEMI.  Donato Heinz, MD

## 2020-07-22 NOTE — Progress Notes (Signed)
Occupational Therapy Treatment Patient Details Name: Patrick Chapman MRN: [id number] DOB: 1961-07-10 Today's Date: 07/22/2020    History of present illness The pt is a 59 yo male presenting with severe left upper quadrant pain, found to have pneumoperitoneum. S/p ex lap and removal of lap-band on 12/07. Extubated 12/8. Post op developed persistent atrial flutter > TEE/DCCV scheduled for 12/16.  PMH includes: obesity, HTN, GERD, sleep apnea, with lap band in 2009.   OT comments  Pt seen in conjunction with PT.  He required mod A +2 to move to EOB sitting on the Lt, and min A +2 for sit <> stand and min A +2 for functional mobility.  He requires set up to total A for ADLs.  He is progressing slowly toward goals, but has been limited due to medical complications.  HR 150-159 during session.  C/o nausea during session.    Follow Up Recommendations  No OT follow up;Supervision/Assistance - 24 hour    Equipment Recommendations  Tub/shower seat    Recommendations for Other Services      Precautions / Restrictions Precautions Precautions: Fall Precaution Comments: JP dain R lower abdomen, wound VAC on abdomen tachy       Mobility Bed Mobility Overal bed mobility: Needs Assistance Bed Mobility: Sidelying to Sit   Sidelying to sit: Mod assist;+2 for physical assistance;+2 for safety/equipment;HOB elevated       General bed mobility comments: assist to lift trunk and scoot hips to EOB  Transfers Overall transfer level: Needs assistance Equipment used: 2 person hand held assist Transfers: Sit to/from [facility] Sit to Stand: Min assist;+2 physical assistance;+2 safety/equipment;From elevated surface Stand pivot transfers: Min assist;+2 physical assistance;+2 safety/equipment       General transfer comment: assist to boost into standing and assist for balance    Balance Overall balance assessment: Needs assistance Sitting-balance support: Feet supported;No upper  extremity supported Sitting balance-Leahy Scale: Fair     Standing balance support: Single extremity supported Standing balance-Leahy Scale: Poor Standing balance comment: reliant on UE support                           ADL either performed or assessed with clinical judgement   ADL Overall ADL's : Needs assistance/impaired             Lower Body Bathing: Maximal assistance;Sit to/from stand           Toilet Transfer: Minimal assistance;+2 for physical assistance;+2 for safety/equipment;Ambulation;Comfort height toilet;Grab bars Toilet Transfer Details (indicate cue type and reason): assist for balance and to move sit <> stand Toileting- Clothing Manipulation and Hygiene: Total assistance;Sit to/from stand       Functional mobility during ADLs: Minimal assistance;+2 for physical assistance;+2 for safety/equipment       Vision       Perception     Praxis      Cognition Arousal/Alertness: Awake/alert Behavior During Therapy: [facility] for tasks assessed/performed;Anxious Overall Cognitive Status: Within Functional Limits for tasks assessed                                          Exercises     Shoulder Instructions       General Comments Pt with HR 150-159 throughout session. BP remained stable    Pertinent Vitals/ Pain       Pain Assessment:  Faces Pain Score: 2  Pain Location: abdomen Pain Descriptors / Indicators: Grimacing Pain Intervention(s): Monitored during session  Home Living                                          Prior Functioning/Environment              Frequency  Min 2X/week        Progress Toward Goals  OT Goals(current goals can now be found in the care plan section)  Progress towards OT goals: Progressing toward goals (slowly due to medical complications)     Plan Discharge plan remains appropriate    Co-evaluation    PT/OT/SLP Co-Evaluation/Treatment: Yes Reason for  Co-Treatment: For patient/therapist safety;To address functional/ADL transfers   OT goals addressed during session: ADL's and self-care      AM-PAC OT "6 Clicks" Daily Activity     Outcome Measure   Help from another person eating meals?: None Help from another person taking care of personal grooming?: A Little Help from another person toileting, which includes using toliet, bedpan, or urinal?: A Lot Help from another person bathing (including washing, rinsing, drying)?: A Lot Help from another person to put on and taking off regular upper body clothing?: A Lot Help from another person to put on and taking off regular lower body clothing?: Total 6 Click Score: 14    End of Session Equipment Utilized During Treatment: Gait belt  OT Visit Diagnosis: Unsteadiness on feet (R26.81);Pain   Activity Tolerance Patient limited by fatigue;Treatment limited secondary to medical complications (Comment) (nausea)   Patient Left in chair;with call bell/phone within reach;with chair alarm set;with family/visitor present   Nurse Communication Mobility status        Time: 1235-1301 OT Time Calculation (min): 26 min  Charges: OT General Charges $OT Visit: 1 Visit OT Treatments $Self Care/Home Management : 8-22 mins  Nilsa Nutting OTR/L Acute Rehabilitation Services Pager [phone] Office [phone]    Lucille Passy M 07/22/2020, 2:18 PM

## 2020-07-22 NOTE — Progress Notes (Signed)
8 Days Post-Op   Subjective/Chief Complaint: N/v this am after fulls yesterday, still having loose stools, passing flatus   Objective: Vital signs in last 24 hours: Temp:  [97.9 F (36.6 C)-98.7 F (37.1 C)] 98.2 F (36.8 C) (12/15 0400) Pulse Rate:  [56-144] 137 (12/15 0600) Resp:  [17-25] 20 (12/15 0600) BP: (102-149)/(65-93) 121/83 (12/15 0600) SpO2:  [92 %-99 %] 93 % (12/15 0600) Last BM Date: 07/21/20  Intake/Output from previous day: 12/14 0701 - 12/15 0700 In: 1110.6 [I.V.:811; IV Piggyback:299.6] Out: 1095 [Urine:960; Drains:135] Intake/Output this shift: No intake/output data recorded.  cv irreg irreg Lungs clear Abs mild distended, drain serosang, vac in place, nontender  Lab Results:  Recent Labs    07/21/20 0421 07/22/20 0500  WBC 13.3* 13.9*  HGB 12.3* 10.9*  HCT 37.8* 34.8*  PLT 265 300   BMET No results for input(s): NA, K, CL, CO2, GLUCOSE, BUN, CREATININE, CALCIUM in the last 72 hours. PT/INR No results for input(s): LABPROT, INR in the last 72 hours. ABG No results for input(s): PHART, HCO3 in the last 72 hours.  Invalid input(s): PCO2, PO2  Studies/Results: DG Chest Port 1 View  Result Date: 07/20/2020 CLINICAL DATA:  Respiratory failure EXAM: PORTABLE CHEST 1 VIEW COMPARISON:  07/14/2020 FINDINGS: Interval extubation. Enteric tube and right IJ central line remain present. There is contrast opacifying the stomach from recent fluoroscopy study. Low lung volumes with bibasilar atelectasis. Stable cardiomediastinal contours with top-normal heart size. No significant pleural effusion. No pneumothorax. IMPRESSION: Low lung volumes and bibasilar atelectasis. Electronically Signed   By: Macy Mis M.D.   On: 07/20/2020 14:02   DG UGI W SINGLE CM (SOL OR THIN BA)  Result Date: 07/20/2020 CLINICAL DATA:  Recent pneumoperitoneum. Surgical removal of laparoscopic band. Rule out bowel leak. EXAM: WATER SOLUBLE UPPER GI SERIES TECHNIQUE:  Single-column upper GI series was performed using water soluble contrast. CONTRAST:  357mL OMNIPAQUE IOHEXOL 300 MG/ML  SOLN COMPARISON:  CT abdomen and pelvis 07/14/2020 FLUOROSCOPY TIME:  Fluoroscopy Time:  1 minutes 6 second Radiation Exposure Index (if provided by the fluoroscopic device): Number of Acquired Spot Images: 10 FINDINGS: Preliminary KUB negative. NG tube is coiled in the stomach with the tip in the gastric antrum. 300 mL Omnipaque 300 was administered through the NG tube. The stomach appears normal without ulcer, mass or leak. Stomach empties normally into the duodenum which appears normal. No duodenal ulcer or leak. The esophagus was not evaluated. IMPRESSION: Water-soluble upper GI administered through NG tube reveals no abnormality the stomach or duodenum. Negative for leak. Electronically Signed   By: Franchot Gallo M.D.   On: 07/20/2020 12:49   [place] EKG SITE RITE  Result Date: 07/20/2020 If Site Rite image not attached, placement could not be confirmed due to current cardiac rhythm.   Anti-infectives: Anti-infectives (From admission, onward)   Start     Dose/Rate Route Frequency Ordered Stop   07/14/20 0900  piperacillin-tazobactam (ZOSYN) IVPB 3.375 g  Status:  Discontinued        3.375 g 100 mL/hr over 30 Minutes Intravenous Every 8 hours 07/14/20 0800 07/14/20 0802   07/14/20 0900  piperacillin-tazobactam (ZOSYN) IVPB 3.375 g        3.375 g 12.5 mL/hr over 240 Minutes Intravenous Every 8 hours 07/14/20 0804 07/19/20 2120   07/14/20 0515  ceFAZolin (ANCEF) IVPB 2g/100 mL premix  Status:  Discontinued        2 g 200 mL/hr over 30 Minutes Intravenous  Once  07/14/20 0501 07/14/20 0753   07/14/20 0115  piperacillin-tazobactam (ZOSYN) IVPB 3.375 g        3.375 g 100 mL/hr over 30 Minutes Intravenous  Once 07/14/20 0111 07/14/20 0157      Assessment/Plan: POD 8 s/p ex lap with removal of lap band for pneumoperitoneum - source near his lap band with bubbling under water so  this was removed - UGI normal - VAC change MWF - cont JP drain, no evidence of leak at this point - PT/OT eval for mobilization - pulm toilet and IS -will back off to sips/chips today and check ab film -follow wbc which is still mildly elevated, I think just has ileus now Aflutter - cont anticoagulation,  amiodarone. . Plan for TEE/cardioversion tomorrow, ICU until after that FEN-NPO/IVF, if going to be much longer will need to consider parenteral ntn ID - s/p 5dzosyn DVT: on doac, scds Dispo: pending aflutter resolution  Rolm Bookbinder 07/22/2020

## 2020-07-22 NOTE — Progress Notes (Signed)
Physical Therapy Treatment Patient Details Name: Patrick Chapman MRN: [id number] DOB: March 19, 1961 Today's Date: 07/22/2020    History of Present Illness The pt is a 59 yo male presenting with severe left upper quadrant pain, found to have pneumoperitoneum. S/p ex lap and removal of lap-band on 12/07. Extubated 12/8. Post op developed persistent atrial flutter > TEE/DCCV scheduled for 12/16.  PMH includes: obesity, HTN, GERD, sleep apnea, with lap band in 2009.    PT Comments      Pt seen in conjunction with OT.  He required mod A +2 to move to EOB sitting on the Lt, and min A +2 for sit <> stand and min A +2 for functional mobility.  He requires set up to total A for ADLs.  He is progressing slowly toward goals, but has been limited due to medical complications.  HR 150-159 during session.  C/o nausea during session.       Follow Up Recommendations  Supervision for mobility/OOB;Home health PT     Equipment Recommendations  Rolling walker with 5" wheels (will consider Bari RW)    Recommendations for Other Services       Precautions / Restrictions Precautions Precautions: Fall Precaution Comments: JP dain R lower abdomen, wound VAC on abdomen tachy    Mobility  Bed Mobility Overal bed mobility: Needs Assistance Bed Mobility: Sidelying to Sit   Sidelying to sit: Mod assist;+2 for physical assistance;+2 for safety/equipment;HOB elevated       General bed mobility comments: assist to lift trunk and scoot hips to EOB  Transfers Overall transfer level: Needs assistance Equipment used: 2 person hand held assist Transfers: Sit to/from [facility] Sit to Stand: Min assist;+2 physical assistance;+2 safety/equipment;From elevated surface Stand pivot transfers: Min assist;+2 physical assistance;+2 safety/equipment       General transfer comment: assist to boost into standing and assist for balance  Ambulation/Gait Ambulation/Gait assistance: Min assist;+2  safety/equipment Gait Distance (Feet): 12 Feet (to bathroom) Assistive device: 2 person hand held assist Gait Pattern/deviations: Step-to pattern;Decreased stride length Gait velocity: decreased   General Gait Details: Small steps and cues to self-monitor for activity tolerance; tachy throughout   Stairs             Wheelchair Mobility    Modified Rankin (Stroke Patients Only)       Balance Overall balance assessment: Needs assistance Sitting-balance support: Feet supported;No upper extremity supported Sitting balance-Leahy Scale: Fair Sitting balance - Comments: requires UE support   Standing balance support: Single extremity supported Standing balance-Leahy Scale: Poor Standing balance comment: reliant on UE support                            Cognition Arousal/Alertness: Awake/alert Behavior During Therapy: WFL for tasks assessed/performed;Anxious Overall Cognitive Status: Within Functional Limits for tasks assessed                                        Exercises      General Comments General comments (skin integrity, edema, etc.): Pt with HR 150-159 throughout session. BP remained stable      Pertinent Vitals/Pain Pain Assessment: Faces Pain Score: 2  Faces Pain Scale: Hurts a little bit Pain Location: abdomen Pain Descriptors / Indicators: Grimacing Pain Intervention(s): Monitored during session    Home Living  Prior Function            PT Goals (current goals can now be found in the care plan section) Acute Rehab PT Goals Patient Stated Goal: Agreeable to getting OOB PT Goal Formulation: With patient Time For Goal Achievement: 07/31/20 Potential to Achieve Goals: Good Progress towards PT goals: Progressing toward goals    Frequency    Min 4X/week      PT Plan Current plan remains appropriate    Co-evaluation PT/OT/SLP Co-Evaluation/Treatment: Yes Reason for Co-Treatment:  For patient/therapist safety PT goals addressed during session: Mobility/safety with mobility OT goals addressed during session: ADL's and self-care      AM-PAC PT "6 Clicks" Mobility   Outcome Measure  Help needed turning from your back to your side while in a flat bed without using bedrails?: None Help needed moving from lying on your back to sitting on the side of a flat bed without using bedrails?: A Lot Help needed moving to and from a bed to a chair (including a wheelchair)?: A Little Help needed standing up from a chair using your arms (e.g., wheelchair or bedside chair)?: A Lot Help needed to walk in hospital room?: A Little Help needed climbing 3-5 steps with a railing? : A Lot 6 Click Score: 16    End of Session   Activity Tolerance: Patient tolerated treatment well Patient left: in chair;with call bell/phone within reach;with family/visitor present Nurse Communication: Mobility status PT Visit Diagnosis: Other abnormalities of gait and mobility (R26.89);Muscle weakness (generalized) (M62.81)     Time: 4917-9150 PT Time Calculation (min) (ACUTE ONLY): 30 min  Charges:  $Gait Training: 8-22 mins                     Roney Marion, [place]  Acute Rehabilitation Services Pager [phone] Office [place] 07/22/2020, 3:49 PM

## 2020-07-23 ENCOUNTER — Inpatient Hospital Stay (HOSPITAL_COMMUNITY): Payer: PRIVATE HEALTH INSURANCE

## 2020-07-23 ENCOUNTER — Inpatient Hospital Stay (HOSPITAL_COMMUNITY): Payer: PRIVATE HEALTH INSURANCE | Admitting: Certified Registered"

## 2020-07-23 ENCOUNTER — Encounter (HOSPITAL_COMMUNITY): Admission: EM | Disposition: A | Discharge status: Home Health | Payer: Self-pay | Source: Home / Self Care

## 2020-07-23 DIAGNOSIS — I4892 Unspecified atrial flutter: Secondary | ICD-10-CM | POA: Diagnosis not present

## 2020-07-23 DIAGNOSIS — I484 Atypical atrial flutter: Secondary | ICD-10-CM | POA: Diagnosis not present

## 2020-07-23 DIAGNOSIS — I10 Essential (primary) hypertension: Secondary | ICD-10-CM

## 2020-07-23 DIAGNOSIS — I5041 Acute combined systolic (congestive) and diastolic (congestive) heart failure: Secondary | ICD-10-CM | POA: Diagnosis not present

## 2020-07-23 HISTORY — PX: TEE WITHOUT CARDIOVERSION: SHX5443

## 2020-07-23 HISTORY — PX: CARDIOVERSION: SHX1299

## 2020-07-23 LAB — BASIC METABOLIC PANEL
Anion gap: 11 (ref 5–15)
BUN: 11 mg/dL (ref 6–20)
CO2: 25 mmol/L (ref 22–32)
Calcium: 8.1 mg/dL — ABNORMAL LOW (ref 8.9–10.3)
Chloride: 101 mmol/L (ref 98–111)
Creatinine, Ser: 1.09 mg/dL (ref 0.61–1.24)
GFR, Estimated: >60 mL/min (ref >60)
Glucose, Bld: 113 mg/dL — ABNORMAL HIGH (ref 70–99)
Potassium: 4 mmol/L (ref 3.5–5.1)
Sodium: 137 mmol/L (ref 135–145)

## 2020-07-23 LAB — CBC
HCT: 33 % — ABNORMAL LOW (ref 39.0–52.0)
Hemoglobin: 11.1 g/dL — ABNORMAL LOW (ref 13.0–17.0)
MCH: 31.1 pg (ref 26.0–34.0)
MCHC: 33.6 g/dL (ref 30.0–36.0)
MCV: 92.4 fL (ref 80.0–100.0)
Platelets: 281 10*3/uL (ref 150–400)
RBC: 3.57 MIL/uL — ABNORMAL LOW (ref 4.22–5.81)
RDW: 15.2 % (ref 11.5–15.5)
WBC: 9.4 10*3/uL (ref 4.0–10.5)
nRBC: 0 % (ref 0.0–0.2)

## 2020-07-23 LAB — GLUCOSE, CAPILLARY
Glucose-Capillary: 122 mg/dL — ABNORMAL HIGH (ref 70–99)
Glucose-Capillary: 99 mg/dL (ref 70–99)
Glucose-Capillary: 102 mg/dL — ABNORMAL HIGH (ref 70–99)
Glucose-Capillary: 99 mg/dL (ref 70–99)

## 2020-07-23 LAB — LIPASE, BLOOD: Lipase: 23 U/L (ref 11–51)

## 2020-07-23 LAB — PROTIME-INR
INR: 1.4 — ABNORMAL HIGH (ref 0.8–1.2)
Prothrombin Time: 17 seconds — ABNORMAL HIGH (ref 11.4–15.2)

## 2020-07-23 LAB — T4, FREE: Free T4: 1.33 ng/dL — ABNORMAL HIGH (ref 0.61–1.12)

## 2020-07-23 SURGERY — ECHOCARDIOGRAM, TRANSESOPHAGEAL
Anesthesia: Monitor Anesthesia Care

## 2020-07-23 MED ORDER — AMIODARONE HCL 200 MG PO TABS
200.0000 mg | ORAL_TABLET | Freq: Two times a day (BID) | ORAL | Status: DC
  Administered 2020-07-23 – 2020-07-24 (×3): 200 mg via ORAL
  Filled 2020-07-23 (×3): qty 1

## 2020-07-23 MED ORDER — OXYCODONE HCL 5 MG PO TABS
5.0000 mg | ORAL_TABLET | ORAL | Status: DC | PRN
  Administered 2020-07-23 (×2): 5 mg via ORAL
  Administered 2020-07-23 – 2020-07-24 (×2): 10 mg via ORAL
  Administered 2020-07-24 (×3): 5 mg via ORAL
  Filled 2020-07-23 (×2): qty 1
  Filled 2020-07-23: qty 2
  Filled 2020-07-23 (×3): qty 1
  Filled 2020-07-23: qty 2

## 2020-07-23 MED ORDER — LOSARTAN POTASSIUM 50 MG PO TABS
25.0000 mg | ORAL_TABLET | Freq: Every day | ORAL | Status: DC
  Administered 2020-07-23 – 2020-07-24 (×2): 25 mg via ORAL
  Filled 2020-07-23 (×2): qty 1

## 2020-07-23 MED ORDER — PROPOFOL 500 MG/50ML IV EMUL
INTRAVENOUS | Status: DC | PRN
  Administered 2020-07-23: 100 ug/kg/min via INTRAVENOUS

## 2020-07-23 MED ORDER — LIDOCAINE HCL (CARDIAC) PF 100 MG/5ML IV SOSY
PREFILLED_SYRINGE | INTRAVENOUS | Status: DC | PRN
  Administered 2020-07-23: 60 mg via INTRATRACHEAL

## 2020-07-23 MED ORDER — METOPROLOL TARTRATE 50 MG PO TABS
50.0000 mg | ORAL_TABLET | Freq: Two times a day (BID) | ORAL | Status: DC
  Administered 2020-07-23: 50 mg via ORAL
  Filled 2020-07-23: qty 1

## 2020-07-23 MED ORDER — SODIUM CHLORIDE 0.9 % IV SOLN: INTRAVENOUS | Status: DC

## 2020-07-23 MED ORDER — MORPHINE SULFATE (PF) 2 MG/ML IV SOLN: 1.0000 mg | INTRAVENOUS | Status: DC | PRN

## 2020-07-23 NOTE — Progress Notes (Addendum)
Day of Surgery   Subjective/Chief Complaint: Just had from TEE/cardioversion Passing gas and had BM   Objective: Vital signs in last 24 hours: Temp:  [97.7 F (36.5 C)-99.3 F (37.4 C)] 98.1 F (36.7 C) (12/16 0814) Pulse Rate:  [44-158] 85 (12/16 0820) Resp:  [17-27] 22 (12/16 0820) BP: (99-139)/(72-91) 109/72 (12/16 0814) SpO2:  [91 %-100 %] 98 % (12/16 0820) Last BM Date: 07/22/20  Intake/Output from previous day: 12/15 0701 - 12/16 0700 In: 1920.4 [I.V.:1920.4] Out: 325 [Urine:300; Drains:25] Intake/Output this shift: Total I/O In: 200 [I.V.:200] Out: -   General appearance: alert and cooperative Resp: clear to auscultation bilaterally Chest wall: VAC in place, +BS Cardio: regular rate and rhythm  Lab Results:  Recent Labs    07/22/20 0500 07/23/20 0047  WBC 13.9* 9.4  HGB 10.9* 11.1*  HCT 34.8* 33.0*  PLT 300 281   BMET Recent Labs    07/23/20 0047  NA 137  K 4.0  CL 101  CO2 25  GLUCOSE 113*  BUN 11  CREATININE 1.09  CALCIUM 8.1*   PT/INR Recent Labs    07/23/20 0545  LABPROT 17.0*  INR 1.4*   ABG No results for input(s): PHART, HCO3 in the last 72 hours.  Invalid input(s): PCO2, PO2  Studies/Results: DG Abd Portable 1V  Result Date: 07/22/2020 CLINICAL DATA:  Postoperative ileus EXAM: PORTABLE ABDOMEN - 1 VIEW COMPARISON:  July 20, 2020 FINDINGS: Stomach is diffusely distended with air. There are loops of dilated small bowel without air-fluid levels appreciable. A small amount of contrast is seen in the right colon. No free air appreciable on supine examination. Surgical clips in right upper abdomen. Lung bases are clear. IMPRESSION: Gastric distension with air. Loops of dilated small bowel, likely representing a degree of ileus. Contrast noted in right colon. No free air evident on supine examination. Lung bases clear. Electronically Signed   By: Lowella Grip III M.D.   On: 07/22/2020 11:20     Anti-infectives: Anti-infectives (From admission, onward)    Start     Dose/Rate Route Frequency Ordered Stop   07/14/20 0900  piperacillin-tazobactam (ZOSYN) IVPB 3.375 g  Status:  Discontinued        3.375 g 100 mL/hr over 30 Minutes Intravenous Every 8 hours 07/14/20 0800 07/14/20 0802   07/14/20 0900  piperacillin-tazobactam (ZOSYN) IVPB 3.375 g        3.375 g 12.5 mL/hr over 240 Minutes Intravenous Every 8 hours 07/14/20 0804 07/19/20 2120   07/14/20 0515  ceFAZolin (ANCEF) IVPB 2g/100 mL premix  Status:  Discontinued        2 g 200 mL/hr over 30 Minutes Intravenous  Once 07/14/20 0501 07/14/20 0753   07/14/20 0115  piperacillin-tazobactam (ZOSYN) IVPB 3.375 g        3.375 g 100 mL/hr over 30 Minutes Intravenous  Once 07/14/20 0111 07/14/20 0157       Assessment/Plan: POD 9 s/p ex lap with removal of lap band for pneumoperitoneum - source near his lap band with bubbling under water so this was removed - UGI normal - VAC change MWF - cont JP drain, no evidence of leak at this point - PT/OT eval for mobilization - pulm toilet and IS  - Ileus - improving, start clears - WBC now WNL A flutter - TEE/cardioversion today FEN - start clears ID - s/p 5d zosyn DVT: on doac, scds Dispo: ICU, watch heart rhythm, check T3/T4  LOS: 9 days    Merri Ray  [place] S[place] 07/23/2020

## 2020-07-23 NOTE — CV Procedure (Signed)
    Transesophageal Echocardiogram Note  Patrick Chapman [id number] [birth year]  Procedure: Transesophageal Echocardiogram Indications: atrial flutter  Procedure Details Consent: Obtained Time Out: Verified patient identification, verified procedure, site/side was marked, verified correct patient position, special equipment/implants available, Radiology Safety Procedures followed,  medications/allergies/relevent history reviewed, required imaging and test results available.  Performed  Medications:  During this procedure the patient is administered a  Propofol drip by [facility] , CRNA .  Total of 220 mg total for TEE and CV  sedation.  The patient's heart rate, blood pressure, and oxygen saturation are monitored continuously during the procedure. The period of  sedation is 30 minutes, of which I was present face-to-face 100% of this time.  Left Ventrical:  Moderate LV dysfunction.  EF 35-40%  Mitral Valve: normal   Aortic Valve: normal   Tricuspid Valve: normal structure, trace TR   Pulmonic Valve: normal   Left Atrium/ Left atrial appendage: no thrombi,  Viewed in 2 views  , X plane  Atrial septum: normal   Aorta: not well visualized    Complications: No apparent complications Patient did tolerate procedure well.       Cardioversion Note  Patrick Chapman [id number] 01/08/1961  Procedure: DC Cardioversion Indications: atrial flutter   Procedure Details Consent: Obtained Time Out: Verified patient identification, verified procedure, site/side was marked, verified correct patient position, special equipment/implants available, Radiology Safety Procedures followed,  medications/allergies/relevent history reviewed, required imaging and test results available.  Performed  The patient has been on adequate anticoagulation.  The patient received IV Propofol 220 mg IV for both the TEE and cardioversion  by [facility] , CRNA  for sedation.  Synchronous cardioversion was performed at   200  joules.  The cardioversion was successful.     Complications: No apparent complications Patient did tolerate procedure well.   Thayer Headings, Brooke Bonito., MD, [facility] 07/23/2020, 8:08 AM

## 2020-07-23 NOTE — Progress Notes (Signed)
PT Cancellation Note  Patient Details Name: Patrick Chapman MRN: [id number] DOB: Feb 17, 1961   Cancelled Treatment:    Reason Eval/Treat Not Completed: Other (comment). Pt at OR for TEE cardioversion this morning. PT will continue to follow and treat as appropriate.   Karma Ganja, PT, DPT   Acute Rehabilitation Department Pager #: [phone]   Otho Bellows 07/23/2020, 7:58 AM

## 2020-07-23 NOTE — Anesthesia Preprocedure Evaluation (Signed)
Anesthesia Evaluation  Patient identified by MRN, date of birth, ID band Patient awake    Reviewed: Allergy & Precautions, NPO status , Patient's Chart, lab work & pertinent test results, reviewed documented beta blocker date and time   History of Anesthesia Complications Negative for: history of anesthetic complications  Airway Mallampati: II  TM Distance: >3 FB Neck ROM: Full    Dental  (+) Teeth Intact   Pulmonary sleep apnea , former smoker,    Pulmonary exam normal        Cardiovascular hypertension, Pt. on home beta blockers and Pt. on medications + dysrhythmias Atrial Fibrillation  Rhythm:Regular Rate:Tachycardia     Neuro/Psych negative neurological ROS  negative psych ROS   GI/Hepatic Neg liver ROS, GERD  ,  Endo/Other  Morbid obesity  Renal/GU negative Renal ROS  negative genitourinary   Musculoskeletal negative musculoskeletal ROS (+)   Abdominal   Peds  Hematology  (+) anemia ,   Anesthesia Other Findings  Recent bowel perforation/sepsis s/p ex lap  Echo 07/15/20: EF 60-65%, mild LVH, normal RV function, normal valves  Reproductive/Obstetrics                             Anesthesia Physical Anesthesia Plan  ASA: III  Anesthesia Plan: MAC   Post-op Pain Management:    Induction: Intravenous  PONV Risk Score and Plan: 1 and Propofol infusion, TIVA and Treatment may vary due to age or medical condition  Airway Management Planned: Natural Airway, Nasal Cannula and Simple Face Mask  Additional Equipment: None  Intra-op Plan:   Post-operative Plan:   Informed Consent: I have reviewed the patients History and Physical, chart, labs and discussed the procedure including the risks, benefits and alternatives for the proposed anesthesia with the patient or authorized representative who has indicated his/her understanding and acceptance.       Plan Discussed with:    Anesthesia Plan Comments:         Anesthesia Quick Evaluation

## 2020-07-23 NOTE — Transfer of Care (Signed)
Immediate Anesthesia Transfer of Care Note  Patient: Patrick Chapman  Procedure(s) Performed: TRANSESOPHAGEAL ECHOCARDIOGRAM (TEE) (N/A ) CARDIOVERSION (N/A )  Patient Location: Endoscopy Unit  Anesthesia Type:MAC and General  Level of Consciousness: awake, alert  and oriented  Airway & Oxygen Therapy: Patient connected to nasal cannula oxygen  Post-op Assessment: Post -op Vital signs reviewed and stable  Post vital signs: stable  Last Vitals:  Vitals Value Taken Time  BP    Temp    Pulse    Resp    SpO2      Last Pain:  Vitals:   07/23/20 0718  TempSrc: Oral  PainSc: 3       Patients Stated Pain Goal: 0 (22/29/79 8921)  Complications: No complications documented.

## 2020-07-23 NOTE — Progress Notes (Addendum)
Progress Note  Patient Name: Patrick Chapman Date of Encounter: 07/23/2020  Primary Cardiologist: Dr. Carlyle Dolly, MD  Subjective   Denies any chest pain or dyspnea  Inpatient Medications    Scheduled Meds: . sodium chloride   Intravenous Once  . apixaban  5 mg Oral BID  . chlorhexidine  15 mL Mouth Rinse BID  . Chlorhexidine Gluconate Cloth  6 each Topical Daily  . insulin aspart  0-15 Units Subcutaneous Q4H  . mouth rinse  15 mL Mouth Rinse q12n4p  . metoprolol tartrate  75 mg Oral BID  . pantoprazole (PROTONIX) IV  40 mg Intravenous Q12H  . sodium chloride flush  10-40 mL Intracatheter Q12H  . tamsulosin  0.4 mg Oral Daily   Continuous Infusions: . sodium chloride Stopped (07/23/20 0804)  . sodium chloride Stopped (07/16/20 1245)  . sodium chloride    . amiodarone 30 mg/hr (07/23/20 0600)  . dextrose 5 % and 0.45 % NaCl with KCl 20 mEq/L 75 mL/hr at 07/23/20 0600  . methocarbamol (ROBAXIN) IV Stopped (07/22/20 0203)   PRN Meds: sodium chloride, sodium chloride, alum & mag hydroxide-simeth, diphenhydrAMINE, ipratropium-albuterol, methocarbamol (ROBAXIN) IV, metoprolol tartrate, morphine injection, ondansetron (ZOFRAN) IV, oxyCODONE, phenol, prochlorperazine, sodium chloride flush   Vital Signs    Vitals:   07/23/20 0825 07/23/20 0830 07/23/20 0835 07/23/20 0845  BP: 124/68 119/81 119/81 131/79  Pulse: 81 82 82 80  Resp: (!) 21 (!) 21 20 18   Temp:      TempSrc:      SpO2: 99% 99% 99% 99%  Weight:      Height:        Intake/Output Summary (Last 24 hours) at 07/23/2020 1015 Last data filed at 07/23/2020 0804 Gross per 24 hour  Intake 2027.93 ml  Output 325 ml  Net 1702.93 ml   Filed Weights   07/14/20 0033  Weight: 131.5 kg    Physical Exam   General: Well developed, well nourished, NAD Neck: No JVD appreciated but difficult to assess given habitus Lungs:Clear to ausculation bilaterally.  Cardiovascular:RRR, no murmurs, distant heart  sounds Abdomen: Soft, non-tender, non-distended. Extremities: Trace BLE edema.  Neuro: Alert and oriented. No focal deficits.  Psych: Responds to questions appropriately with normal affect.    Labs    Chemistry Recent Labs  Lab 07/18/20 0725 07/19/20 0115 07/23/20 0047  NA 139 138 137  K 3.9 3.8 4.0  CL 103 103 101  CO2 26 23 25   GLUCOSE 85 119* 113*  BUN 15 15 11   CREATININE 0.97 0.98 1.09  CALCIUM 8.2* 8.2* 8.1*  GFRNONAA >60 >60 >60  ANIONGAP 10 12 11      Hematology Recent Labs  Lab 07/21/20 0421 07/22/20 0500 07/23/20 0047  WBC 13.3* 13.9* 9.4  RBC 4.04* 3.67* 3.57*  HGB 12.3* 10.9* 11.1*  HCT 37.8* 34.8* 33.0*  MCV 93.6 94.8 92.4  MCH 30.4 29.7 31.1  MCHC 32.5 31.3 33.6  RDW 14.9 15.1 15.2  PLT 265 300 281    Cardiac EnzymesNo results for input(s): TROPONINI in the last 168 hours. No results for input(s): TROPIPOC in the last 168 hours.   BNP Recent Labs  Lab 07/17/20 0247  BNP 112.2*     DDimer No results for input(s): DDIMER in the last 168 hours.   Radiology    DG Abd Portable 1V  Result Date: 07/22/2020 CLINICAL DATA:  Postoperative ileus EXAM: PORTABLE ABDOMEN - 1 VIEW COMPARISON:  July 20, 2020 FINDINGS: Stomach  is diffusely distended with air. There are loops of dilated small bowel without air-fluid levels appreciable. A small amount of contrast is seen in the right colon. No free air appreciable on supine examination. Surgical clips in right upper abdomen. Lung bases are clear. IMPRESSION: Gastric distension with air. Loops of dilated small bowel, likely representing a degree of ileus. Contrast noted in right colon. No free air evident on supine examination. Lung bases clear. Electronically Signed   By: Lowella Grip III M.D.   On: 07/22/2020 11:20    Telemetry    NSR in 80s- Personally Reviewed  ECG    No new tracing  - Personally Reviewed  Cardiac Studies   Echo 07/15/2020 1. Left ventricular ejection fraction, by  estimation, is 60 to 65%. The  left ventricle has normal function. The left ventricle has no regional  wall motion abnormalities. There is mild left ventricular hypertrophy.  Left ventricular diastolic parameters  are indeterminate.  2. Right ventricular systolic function is normal. The right ventricular  size is normal. Tricuspid regurgitation signal is inadequate for assessing  PA pressure.  3. The mitral valve is normal in structure. No evidence of mitral valve  regurgitation.  4. The aortic valve was not well visualized. Aortic valve regurgitation  is not visualized. No aortic stenosis is present.   Patient Profile     59 y.o. male with a hx of palpitation, hypertension, prior lap band and history of kidney stone who is being seen today for the evaluation of atrial flutter at the request of Dr. Johnsie Cancel.  Assessment & Plan    Atrial flutter: Precipitated by sepsis/bowel perforation.  Difficult to rate control.  Underwent TEE/DCCV today, successfully converted to normal sinus rhythm. -Continue Eliquis 5 mg BID -Has received about 5 g of amiodarone load.  Will discontinue IV amiodarone and start amio 200 mg twice daily for 7 days, then can decrease to 200 mg daily x3 weeks.  Can likely stop amiodarone at that point.  Will not plan on long-term use, but will continue in short-term as he recovers from his surgery -Continue metoprolol, will plan to consolidate to XL by discharge  Acute combined systolic and diastolic heart failure: EF 60 to 65% on TTE 12/8.  On TEE today, EF down to 35 to 40%.  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 -Continue metoprolol -Will add losartan 25 mg daily  -Will plan repeat echo in 3 months to monitor for improvement with restoration of sinus rhythm  S/p exploratory laparotomy with removal of lap band for pneumoperitoneum: -Management per surgery team   For questions or updates, please contact   Please consult  [email or web address] for contact info under Cardiology/STEMI.  Donato Heinz, MD

## 2020-07-23 NOTE — Interval H&P Note (Signed)
History and Physical Interval Note:  07/23/2020 6:55 AM  Patrick Chapman  has presented today for surgery, with the diagnosis of AFIB.  The various methods of treatment have been discussed with the patient and family. After consideration of risks, benefits and other options for treatment, the patient has consented to  Procedure(s): TRANSESOPHAGEAL ECHOCARDIOGRAM (TEE) (N/A) CARDIOVERSION (N/A) as a surgical intervention.  The patient's history has been reviewed, patient examined, no change in status, stable for surgery.  I have reviewed the patient's chart and labs.  Questions were answered to the patient's satisfaction.     Mertie Moores

## 2020-07-23 NOTE — Anesthesia Postprocedure Evaluation (Signed)
Anesthesia Post Note  Patient: CHRISTIE COPLEY  Procedure(s) Performed: TRANSESOPHAGEAL ECHOCARDIOGRAM (TEE) (N/A ) CARDIOVERSION (N/A )     Patient location during evaluation: Endoscopy Anesthesia Type: MAC Level of consciousness: awake and alert Pain management: pain level controlled Vital Signs Assessment: post-procedure vital signs reviewed and stable Respiratory status: spontaneous breathing, nonlabored ventilation and respiratory function stable Cardiovascular status: blood pressure returned to baseline and stable Postop Assessment: no apparent nausea or vomiting Anesthetic complications: no   No complications documented.  Last Vitals:  Vitals:   07/23/20 0820 07/23/20 0830  BP:  119/81  Pulse: 85 82  Resp: (!) 22 (!) 21  Temp:    SpO2: 98% 99%    Last Pain:  Vitals:   07/23/20 0830  TempSrc:   PainSc: 3                  Lidia Collum

## 2020-07-23 NOTE — Progress Notes (Signed)
  Echocardiogram Echocardiogram Transesophageal has been performed.  Jennette Dubin 07/23/2020, 8:27 AM

## 2020-07-24 ENCOUNTER — Other Ambulatory Visit (HOSPITAL_COMMUNITY): Payer: Self-pay | Admitting: General Surgery

## 2020-07-24 ENCOUNTER — Encounter (HOSPITAL_COMMUNITY): Payer: Self-pay | Admitting: Cardiovascular Disease

## 2020-07-24 DIAGNOSIS — I484 Atypical atrial flutter: Secondary | ICD-10-CM | POA: Diagnosis not present

## 2020-07-24 DIAGNOSIS — I5041 Acute combined systolic (congestive) and diastolic (congestive) heart failure: Secondary | ICD-10-CM | POA: Diagnosis not present

## 2020-07-24 LAB — GLUCOSE, CAPILLARY
Glucose-Capillary: 107 mg/dL — ABNORMAL HIGH (ref 70–99)
Glucose-Capillary: 110 mg/dL — ABNORMAL HIGH (ref 70–99)
Glucose-Capillary: 110 mg/dL — ABNORMAL HIGH (ref 70–99)
Glucose-Capillary: 116 mg/dL — ABNORMAL HIGH (ref 70–99)

## 2020-07-24 LAB — BASIC METABOLIC PANEL
Anion gap: 11 (ref 5–15)
BUN: 11 mg/dL (ref 6–20)
CO2: 27 mmol/L (ref 22–32)
Calcium: 8.1 mg/dL — ABNORMAL LOW (ref 8.9–10.3)
Chloride: 100 mmol/L (ref 98–111)
Creatinine, Ser: 1.11 mg/dL (ref 0.61–1.24)
GFR, Estimated: >60 mL/min (ref >60)
Glucose, Bld: 116 mg/dL — ABNORMAL HIGH (ref 70–99)
Potassium: 3.8 mmol/L (ref 3.5–5.1)
Sodium: 138 mmol/L (ref 135–145)

## 2020-07-24 LAB — CBC
HCT: 31.5 % — ABNORMAL LOW (ref 39.0–52.0)
Hemoglobin: 10.6 g/dL — ABNORMAL LOW (ref 13.0–17.0)
MCH: 31.5 pg (ref 26.0–34.0)
MCHC: 33.7 g/dL (ref 30.0–36.0)
MCV: 93.8 fL (ref 80.0–100.0)
Platelets: 330 10*3/uL (ref 150–400)
RBC: 3.36 MIL/uL — ABNORMAL LOW (ref 4.22–5.81)
RDW: 15.3 % (ref 11.5–15.5)
WBC: 8.7 10*3/uL (ref 4.0–10.5)
nRBC: 0 % (ref 0.0–0.2)

## 2020-07-24 MED ORDER — AMIODARONE HCL 200 MG PO TABS: 200.0000 mg | ORAL_TABLET | Freq: Every day | ORAL | 0 refills | Status: DC

## 2020-07-24 MED ORDER — OXYCODONE HCL 5 MG PO TABS: 5.0000 mg | ORAL_TABLET | Freq: Four times a day (QID) | ORAL | 0 refills | Status: DC | PRN

## 2020-07-24 MED ORDER — METOPROLOL SUCCINATE ER 50 MG PO TB24
50.0000 mg | ORAL_TABLET | Freq: Every day | ORAL | Status: DC
  Administered 2020-07-24: 50 mg via ORAL
  Filled 2020-07-24: qty 1

## 2020-07-24 MED ORDER — APIXABAN 5 MG PO TABS: 5.0000 mg | ORAL_TABLET | Freq: Two times a day (BID) | ORAL | 0 refills | Status: DC

## 2020-07-24 MED ORDER — PANTOPRAZOLE SODIUM 40 MG PO TBEC
40.0000 mg | DELAYED_RELEASE_TABLET | Freq: Two times a day (BID) | ORAL | Status: DC
  Administered 2020-07-24: 40 mg via ORAL

## 2020-07-24 MED ORDER — AMIODARONE HCL 200 MG PO TABS: 200.0000 mg | ORAL_TABLET | Freq: Two times a day (BID) | ORAL | 0 refills | Status: DC

## 2020-07-24 MED ORDER — LOSARTAN POTASSIUM 25 MG PO TABS: 25.0000 mg | ORAL_TABLET | Freq: Every day | ORAL | 0 refills | Status: DC

## 2020-07-24 MED ORDER — METOPROLOL SUCCINATE ER 50 MG PO TB24: 50.0000 mg | ORAL_TABLET | Freq: Every day | ORAL | 0 refills | Status: DC

## 2020-07-24 MED FILL — AMIODARONE HCL 200 MG TABS: 200 | 27 days supply | Qty: 33 | Fill #0

## 2020-07-24 MED FILL — oxyCODONE HCL 5 MG TABS: 5 | 5 days supply | Qty: 20 | Fill #0

## 2020-07-24 MED FILL — LOSARTAN POTASSIUM 25 MG TA: 25 | 30 days supply | Qty: 30 | Fill #0

## 2020-07-24 MED FILL — METOPROLOL SUCCINATE ER 50: 50 | 30 days supply | Qty: 30 | Fill #0

## 2020-07-24 MED FILL — ELIQUIS 5 MG TABLET: 5 | 30 days supply | Qty: 60 | Fill #0

## 2020-07-24 NOTE — Progress Notes (Incomplete)
Progress Note  Patient Name: Patrick Chapman Date of Encounter: 07/24/2020  Primary Cardiologist: Dr. Carlyle Dolly, MD  Subjective   ***  Inpatient Medications    Scheduled Meds: . sodium chloride   Intravenous Once  . amiodarone  200 mg Oral BID  . apixaban  5 mg Oral BID  . chlorhexidine  15 mL Mouth Rinse BID  . Chlorhexidine Gluconate Cloth  6 each Topical Daily  . insulin aspart  0-15 Units Subcutaneous Q4H  . losartan  25 mg Oral Daily  . mouth rinse  15 mL Mouth Rinse q12n4p  . metoprolol succinate  50 mg Oral Daily  . pantoprazole (PROTONIX) IV  40 mg Intravenous Q12H  . sodium chloride flush  10-40 mL Intracatheter Q12H  . tamsulosin  0.4 mg Oral Daily   Continuous Infusions: . sodium chloride Stopped (07/23/20 0804)  . sodium chloride Stopped (07/16/20 1245)  . sodium chloride    . dextrose 5 % and 0.45 % NaCl with KCl 20 mEq/L 75 mL/hr at 07/24/20 0700  . methocarbamol (ROBAXIN) IV Stopped (07/23/20 1228)   PRN Meds: sodium chloride, sodium chloride, alum & mag hydroxide-simeth, diphenhydrAMINE, ipratropium-albuterol, methocarbamol (ROBAXIN) IV, metoprolol tartrate, morphine injection, ondansetron (ZOFRAN) IV, oxyCODONE, phenol, sodium chloride flush   Vital Signs    Vitals:   07/24/20 0600 07/24/20 0700 07/24/20 0730 07/24/20 0901  BP: 108/72 110/66    Pulse: 75 73 79   Resp: 14 15 20    Temp:    97.8 F (36.6 C)  TempSrc:    Oral  SpO2: 94% 95% 96%   Weight:      Height:        Intake/Output Summary (Last 24 hours) at 07/24/2020 0932 Last data filed at 07/24/2020 0700 Gross per 24 hour  Intake 1572.84 ml  Output 1200 ml  Net 372.84 ml   Filed Weights   07/14/20 0033  Weight: 131.5 kg    Physical Exam   GEN: Well nourished, well developed, in no acute distress.  HEENT: Grossly normal.  Neck: Supple, no JVD, carotid bruits, or masses. Cardiac: RRR, no murmurs, rubs, or gallops. No clubbing, cyanosis, edema.  Radials/DP/PT 2+ and  equal bilaterally.  Respiratory:  Respirations regular and unlabored, clear to auscultation bilaterally. GI: Soft, nontender, nondistended, BS + x 4. MS: no deformity or atrophy. Skin: warm and dry, no rash. Neuro:  Strength and sensation are intact. Psych: AAOx3.  Normal affect.  Labs    Chemistry Recent Labs  Lab 07/19/20 0115 07/23/20 0047 07/24/20 0228  NA 138 137 138  K 3.8 4.0 3.8  CL 103 101 100  CO2 23 25 27   GLUCOSE 119* 113* 116*  BUN 15 11 11   CREATININE 0.98 1.09 1.11  CALCIUM 8.2* 8.1* 8.1*  GFRNONAA >60 >60 >60  ANIONGAP 12 11 11      Hematology Recent Labs  Lab 07/22/20 0500 07/23/20 0047 07/24/20 0228  WBC 13.9* 9.4 8.7  RBC 3.67* 3.57* 3.36*  HGB 10.9* 11.1* 10.6*  HCT 34.8* 33.0* 31.5*  MCV 94.8 92.4 93.8  MCH 29.7 31.1 31.5  MCHC 31.3 33.6 33.7  RDW 15.1 15.2 15.3  PLT 300 281 330    Cardiac EnzymesNo results for input(s): TROPONINI in the last 168 hours. No results for input(s): TROPIPOC in the last 168 hours.   BNPNo results for input(s): BNP, PROBNP in the last 168 hours.   DDimer No results for input(s): DDIMER in the last 168 hours.  Radiology    DG Abd Portable 1V  Result Date: 07/22/2020 CLINICAL DATA:  Postoperative ileus EXAM: PORTABLE ABDOMEN - 1 VIEW COMPARISON:  July 20, 2020 FINDINGS: Stomach is diffusely distended with air. There are loops of dilated small bowel without air-fluid levels appreciable. A small amount of contrast is seen in the right colon. No free air appreciable on supine examination. Surgical clips in right upper abdomen. Lung bases are clear. IMPRESSION: Gastric distension with air. Loops of dilated small bowel, likely representing a degree of ileus. Contrast noted in right colon. No free air evident on supine examination. Lung bases clear. Electronically Signed   By: Lowella Grip III M.D.   On: 07/22/2020 11:20   ECHO TEE  Result Date: 07/23/2020    TRANSESOPHOGEAL ECHO REPORT   Patient Name:    Patrick Chapman Date of Exam: 07/23/2020 Medical Rec #:  [id number]      Height:       72.0 in Accession #:    [id number]     Weight:       290.0 lb Date of Birth:  Sep 29, 1960     BSA:          2.495 m Patient Age:    38 years       BP:           109/72 mmHg Patient Gender: M              HR:           137 bpm. Exam Location:  Inpatient Procedure: Transesophageal Echo, Limited Color Doppler and Cardiac Doppler Indications:     Atrial Flutter  History:         Patient has prior history of Echocardiogram examinations, most                  recent 07/15/2020. Risk Factors:Hypertension.  Sonographer:     Mikki Santee RDCS (AE) Referring Phys:  [id number] Leanor Kail Diagnosing Phys: Mertie Moores MD PROCEDURE: After discussion of the risks and benefits of a TEE, an informed consent was obtained. The transesophogeal probe was passed without difficulty through the esophogus of the patient. Sedation performed by different physician. The patient was monitored while under deep sedation. Anesthestetic sedation was provided intravenously by Anesthesiology: 220.92mg  of Propofol, 60mg  of Lidocaine. The patient developed no complications during the procedure. IMPRESSIONS  1. Left ventricular ejection fraction, by estimation, is 55 to 60%. The left ventricle has normal function. The left ventricle has no regional wall motion abnormalities.  2. Right ventricular systolic function is normal. The right ventricular size is normal.  3. No left atrial/left atrial appendage thrombus was detected.  4. The mitral valve is normal in structure. No evidence of mitral valve regurgitation.  5. The aortic valve is normal in structure. Aortic valve regurgitation is not visualized. No aortic stenosis is present. FINDINGS  Left Ventricle: Left ventricular ejection fraction, by estimation, is 55 to 60%. The left ventricle has normal function. The left ventricle has no regional wall motion abnormalities. The left ventricular internal cavity  size was normal in size. Right Ventricle: The right ventricular size is normal. No increase in right ventricular wall thickness. Right ventricular systolic function is normal. Left Atrium: Left atrial size was normal in size. No left atrial/left atrial appendage thrombus was detected. Right Atrium: Right atrial size was normal in size. Pericardium: There is no evidence of pericardial effusion. Mitral Valve: The mitral valve is normal in structure. No evidence of mitral  valve regurgitation. Tricuspid Valve: The tricuspid valve is normal in structure. Tricuspid valve regurgitation is not demonstrated. Aortic Valve: The aortic valve is normal in structure. Aortic valve regurgitation is not visualized. No aortic stenosis is present. Pulmonic Valve: The pulmonic valve was normal in structure. Pulmonic valve regurgitation is not visualized. Aorta: The aortic root and ascending aorta are structurally normal, with no evidence of dilitation. IAS/Shunts: The atrial septum is grossly normal. Mertie Moores MD Electronically signed by Mertie Moores MD Signature Date/Time: 07/23/2020/2:32:01 PM    Final     Telemetry    *** - Personally Reviewed  ECG    *** - Personally Reviewed  Cardiac Studies   Echo 07/15/2020 1. Left ventricular ejection fraction, by estimation, is 60 to 65%. The  left ventricle has normal function. The left ventricle has no regional  wall motion abnormalities. There is mild left ventricular hypertrophy.  Left ventricular diastolic parameters  are indeterminate.  2. Right ventricular systolic function is normal. The right ventricular  size is normal. Tricuspid regurgitation signal is inadequate for assessing  PA pressure.  3. The mitral valve is normal in structure. No evidence of mitral valve  regurgitation.  4. The aortic valve was not well visualized. Aortic valve regurgitation  is not visualized. No aortic stenosis is present.   Patient Profile     59 y.o. male with a hx of  palpitation, hypertension, prior lap band and history of kidney stonewho is being seen today for the evaluation of atrial flutter.  Assessment & Plan    1. Atrial flutter:  -In the setting of sepsis/bowel perforation with difficult to control rates. Underwent TEE/DCCV 07/23/20 with successful conversion to NSR -Tele today reveals  -Plan is for PO amio 200 mg twice daily for 7 days, then can decrease to 200 mg daily x3 weeks with hopeful plan to d/c thereafter with no long-term use -Continue Toprol XL and Eliquis   metoprolol, will plan to consolidate to XL by discharge  2. Acute combined systolic and diastolic heart failure:  -LVEF at 60 to 65% per echo 12/8.   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 -Continue Toprol with the addition of ;osartan 25 mg daily  -Plan to repeat echo in 3 months to monitor for improvement with restoration of sinus rhythm  3. S/p exploratory laparotomy with removal of lap band for pneumoperitoneum: -Management per surgery team   Signed, Kathyrn Drown NP-C [facility] Pager: [phone] 07/24/2020, 9:32 AM     For questions or updates, please contact   Please consult [email or web address] for contact info under Cardiology/STEMI.

## 2020-07-24 NOTE — Progress Notes (Signed)
Progress Note  Patient Name: Patrick Chapman Date of Encounter: 07/24/2020  Primary Cardiologist: Dr. Carlyle Dolly, MD  Subjective   Denies any chest pain or dyspnea  Inpatient Medications    Scheduled Meds: . sodium chloride   Intravenous Once  . amiodarone  200 mg Oral BID  . apixaban  5 mg Oral BID  . chlorhexidine  15 mL Mouth Rinse BID  . Chlorhexidine Gluconate Cloth  6 each Topical Daily  . insulin aspart  0-15 Units Subcutaneous Q4H  . losartan  25 mg Oral Daily  . mouth rinse  15 mL Mouth Rinse q12n4p  . metoprolol succinate  50 mg Oral Daily  . pantoprazole (PROTONIX) IV  40 mg Intravenous Q12H  . sodium chloride flush  10-40 mL Intracatheter Q12H  . tamsulosin  0.4 mg Oral Daily   Continuous Infusions: . sodium chloride Stopped (07/23/20 0804)  . sodium chloride Stopped (07/16/20 1245)  . sodium chloride    . dextrose 5 % and 0.45 % NaCl with KCl 20 mEq/L 75 mL/hr at 07/24/20 0700  . methocarbamol (ROBAXIN) IV Stopped (07/23/20 1228)   PRN Meds: sodium chloride, sodium chloride, alum & mag hydroxide-simeth, diphenhydrAMINE, ipratropium-albuterol, methocarbamol (ROBAXIN) IV, metoprolol tartrate, morphine injection, ondansetron (ZOFRAN) IV, oxyCODONE, phenol, sodium chloride flush   Vital Signs    Vitals:   07/24/20 0600 07/24/20 0700 07/24/20 0730 07/24/20 0901  BP: 108/72 110/66    Pulse: 75 73 79   Resp: 14 15 20    Temp:    97.8 F (36.6 C)  TempSrc:    Oral  SpO2: 94% 95% 96%   Weight:      Height:        Intake/Output Summary (Last 24 hours) at 07/24/2020 9622 Last data filed at 07/24/2020 0700 Gross per 24 hour  Intake 1572.84 ml  Output 1200 ml  Net 372.84 ml   Filed Weights   07/14/20 0033  Weight: 131.5 kg    Physical Exam   General: Well developed, well nourished, NAD Neck: No JVD appreciated but difficult to assess given habitus Lungs:Clear to ausculation bilaterally.  Cardiovascular:RRR, no murmurs, distant heart  sounds Abdomen: Soft, non-tender, non-distended. Extremities: Trace BLE edema.  Neuro: Alert and oriented. No focal deficits.  Psych: Responds to questions appropriately with normal affect.    Labs    Chemistry Recent Labs  Lab 07/19/20 0115 07/23/20 0047 07/24/20 0228  NA 138 137 138  K 3.8 4.0 3.8  CL 103 101 100  CO2 23 25 27   GLUCOSE 119* 113* 116*  BUN 15 11 11   CREATININE 0.98 1.09 1.11  CALCIUM 8.2* 8.1* 8.1*  GFRNONAA >60 >60 >60  ANIONGAP 12 11 11      Hematology Recent Labs  Lab 07/22/20 0500 07/23/20 0047 07/24/20 0228  WBC 13.9* 9.4 8.7  RBC 3.67* 3.57* 3.36*  HGB 10.9* 11.1* 10.6*  HCT 34.8* 33.0* 31.5*  MCV 94.8 92.4 93.8  MCH 29.7 31.1 31.5  MCHC 31.3 33.6 33.7  RDW 15.1 15.2 15.3  PLT 300 281 330    Cardiac EnzymesNo results for input(s): TROPONINI in the last 168 hours. No results for input(s): TROPIPOC in the last 168 hours.   BNP No results for input(s): BNP, PROBNP in the last 168 hours.   DDimer No results for input(s): DDIMER in the last 168 hours.   Radiology    DG Abd Portable 1V  Result Date: 07/22/2020 CLINICAL DATA:  Postoperative ileus EXAM: PORTABLE ABDOMEN -  1 VIEW COMPARISON:  July 20, 2020 FINDINGS: Stomach is diffusely distended with air. There are loops of dilated small bowel without air-fluid levels appreciable. A small amount of contrast is seen in the right colon. No free air appreciable on supine examination. Surgical clips in right upper abdomen. Lung bases are clear. IMPRESSION: Gastric distension with air. Loops of dilated small bowel, likely representing a degree of ileus. Contrast noted in right colon. No free air evident on supine examination. Lung bases clear. Electronically Signed   By: Lowella Grip III M.D.   On: 07/22/2020 11:20   ECHO TEE  Result Date: 07/23/2020    TRANSESOPHOGEAL ECHO REPORT   Patient Name:   Patrick Chapman Date of Exam: 07/23/2020 Medical Rec #:  [id number]      Height:       72.0  in Accession #:    [id number]     Weight:       290.0 lb Date of Birth:  08-14-1960     BSA:          2.495 m Patient Age:    59 years       BP:           109/72 mmHg Patient Gender: M              HR:           137 bpm. Exam Location:  Inpatient Procedure: Transesophageal Echo, Limited Color Doppler and Cardiac Doppler Indications:     Atrial Flutter  History:         Patient has prior history of Echocardiogram examinations, most                  recent 07/15/2020. Risk Factors:Hypertension.  Sonographer:     Mikki Santee RDCS (AE) Referring Phys:  [id number] Leanor Kail Diagnosing Phys: Mertie Moores MD PROCEDURE: After discussion of the risks and benefits of a TEE, an informed consent was obtained. The transesophogeal probe was passed without difficulty through the esophogus of the patient. Sedation performed by different physician. The patient was monitored while under deep sedation. Anesthestetic sedation was provided intravenously by Anesthesiology: 220.92mg  of Propofol, 60mg  of Lidocaine. The patient developed no complications during the procedure. IMPRESSIONS  1. Left ventricular ejection fraction, by estimation, is 55 to 60%. The left ventricle has normal function. The left ventricle has no regional wall motion abnormalities.  2. Right ventricular systolic function is normal. The right ventricular size is normal.  3. No left atrial/left atrial appendage thrombus was detected.  4. The mitral valve is normal in structure. No evidence of mitral valve regurgitation.  5. The aortic valve is normal in structure. Aortic valve regurgitation is not visualized. No aortic stenosis is present. FINDINGS  Left Ventricle: Left ventricular ejection fraction, by estimation, is 55 to 60%. The left ventricle has normal function. The left ventricle has no regional wall motion abnormalities. The left ventricular internal cavity size was normal in size. Right Ventricle: The right ventricular size is normal. No increase  in right ventricular wall thickness. Right ventricular systolic function is normal. Left Atrium: Left atrial size was normal in size. No left atrial/left atrial appendage thrombus was detected. Right Atrium: Right atrial size was normal in size. Pericardium: There is no evidence of pericardial effusion. Mitral Valve: The mitral valve is normal in structure. No evidence of mitral valve regurgitation. Tricuspid Valve: The tricuspid valve is normal in structure. Tricuspid valve regurgitation is not demonstrated. Aortic Valve: The aortic  valve is normal in structure. Aortic valve regurgitation is not visualized. No aortic stenosis is present. Pulmonic Valve: The pulmonic valve was normal in structure. Pulmonic valve regurgitation is not visualized. Aorta: The aortic root and ascending aorta are structurally normal, with no evidence of dilitation. IAS/Shunts: The atrial septum is grossly normal. Mertie Moores MD Electronically signed by Mertie Moores MD Signature Date/Time: 07/23/2020/2:32:01 PM    Final     Telemetry    NSR in 80s- Personally Reviewed  ECG    No new tracing  - Personally Reviewed  Cardiac Studies   Echo 07/15/2020 1. Left ventricular ejection fraction, by estimation, is 60 to 65%. The  left ventricle has normal function. The left ventricle has no regional  wall motion abnormalities. There is mild left ventricular hypertrophy.  Left ventricular diastolic parameters  are indeterminate.  2. Right ventricular systolic function is normal. The right ventricular  size is normal. Tricuspid regurgitation signal is inadequate for assessing  PA pressure.  3. The mitral valve is normal in structure. No evidence of mitral valve  regurgitation.  4. The aortic valve was not well visualized. Aortic valve regurgitation  is not visualized. No aortic stenosis is present.   Patient Profile     59 y.o. male with a hx of palpitation, hypertension, prior lap band and history of kidney stone who  is being seen today for the evaluation of atrial flutter at the request of Dr. Johnsie Cancel.  Assessment & Plan    Atrial flutter: Precipitated by sepsis/bowel perforation.  Difficult to rate control.  Underwent TEE/DCCV yesterday, successfully converted to normal sinus rhythm. -Continue Eliquis 5 mg BID -Has received about 5 g of IV amiodarone load.  Discontinued IV amio and started amio 200 mg twice daily for 7 days (until 12/23), then can decrease to 200 mg daily x3 weeks.  Can likely stop amiodarone at that point.  Will not plan on long-term use, but will continue in short-term as he recovers from his surgery -Continue metoprolol, will plan to consolidate to XL by discharge  Acute combined systolic and diastolic heart failure: EF 60 to 65% on TTE 12/8.  On TEE yesterday, EF down to 35 to 40%.  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 -Continue metoprolol XL 50 mg daily -Continue losartan 25 mg daily  -Will plan repeat echo in 2-3 months to monitor for improvement with restoration of sinus rhythm  S/p exploratory laparotomy with removal of lap band for pneumoperitoneum: -Management per surgery team    [facility] will sign off.   Medication Recommendations:  Stop home Azor.  Start Eliquis 5 mg BID, amiodarone 200 mg BID x7 days (through 12/23) then decrease to 200 mg daily x3 weeks then stop.  Start losartan 25 mg daily.  Start toprol XL 50 mg daily. Other recommendations (labs, testing, etc):  BMET in 1 week Follow up as an outpatient:  Will schedule follow-up.   For questions or updates, please contact   Please consult [email or web address] for contact info under Cardiology/STEMI.  Donato Heinz, MD

## 2020-07-24 NOTE — Progress Notes (Signed)
Physical Therapy Treatment Patient Details Name: Patrick Chapman MRN: [id number] DOB: [birth year] Today's Date: 07/24/2020    History of Present Illness The pt is a 59 yo male presenting with severe left upper quadrant pain, found to have pneumoperitoneum. S/p ex lap and removal of lap-band on 12/07. Extubated 12/8. Post op developed persistent atrial flutter > TEE/DCCV scheduled for 12/16.  PMH includes: obesity, HTN, GERD, sleep apnea, with lap band in 2009.    PT Comments    The pt was able to significantly improve mobility and progress with PT goals at this time compared to prior sessions. He was received mobilizing in the room without use of an AD with OT, and was able to walk 150 ft around the unit with minG assist and without AD. He does use a slow gait pattern with decreased stride length and increased lateral movement, but was able to maintain mobility without LOB and with VSS. The pt also completed navigation of 1 step in anticipation of d/c home and was able to complete with minG for safety and use of single rail. The pt is safe to return home with supervision and assist from family as needed, and will continue to benefit from skilled PT to progress functional endurance, strength, and dynamic stability.    Follow Up Recommendations  Supervision for mobility/OOB;Home health PT     Equipment Recommendations  Rolling walker with 5" wheels    Recommendations for Other Services       Precautions / Restrictions Precautions Precautions: Fall Precaution Comments: JP drain removed, wound vac currently disconnected Restrictions Weight Bearing Restrictions: No    Mobility  Bed Mobility Overal bed mobility: Needs Assistance Bed Mobility: Sit to Supine     Supine to sit: Min assist;HOB elevated Sit to supine: Mod assist   General bed mobility comments: assist at BLE to return to supine position, pt able to control UE and trunk  Transfers Overall transfer level: Needs  assistance Equipment used: None Transfers: Sit to/from Stand Sit to Stand: Min guard         General transfer comment: minG for safety, able to push to stand from rail in bathroom without assist  Ambulation/Gait Ambulation/Gait assistance: Min guard Gait Distance (Feet): 150 Feet Assistive device: None Gait Pattern/deviations: Step-to pattern;Shuffle;Decreased stride length Gait velocity: 0.2 m/s Gait velocity interpretation: <1.31 ft/sec, indicative of household ambulator General Gait Details: decreased stride length with minimal stride length. increased lateral movement   Stairs Stairs: Yes Stairs assistance: Min guard Stair Management: One rail Right;Step to pattern;Forwards Number of Stairs: 1 General stair comments: pt able to power up with single rail, no evidence of instability      Balance Overall balance assessment: Needs assistance Sitting-balance support: No upper extremity supported;Feet supported Sitting balance-Leahy Scale: Good Sitting balance - Comments: requires UE support   Standing balance support: No upper extremity supported Standing balance-Leahy Scale: Fair Standing balance comment: able to ambulate without UE support                            Cognition Arousal/Alertness: Awake/alert Behavior During Therapy: WFL for tasks assessed/performed Overall Cognitive Status: Within Functional Limits for tasks assessed                                 General Comments: Pt requiring increased time for processing. Slightly overwhelmed by increased information.  Exercises      General Comments General comments (skin integrity, edema, etc.): BP stable after gait and stairs      Pertinent Vitals/Pain Pain Assessment: Faces Pain Score: 4  Faces Pain Scale: Hurts a little bit Pain Location: abdomen Pain Descriptors / Indicators: Discomfort;Sore Pain Intervention(s): Limited activity within patient's tolerance;Monitored  during session;Repositioned           PT Goals (current goals can now be found in the care plan section) Acute Rehab PT Goals Patient Stated Goal: to go home today PT Goal Formulation: With patient Time For Goal Achievement: 07/31/20 Potential to Achieve Goals: Good Progress towards PT goals: Progressing toward goals    Frequency    Min 4X/week      PT Plan Current plan remains appropriate       AM-PAC PT "6 Clicks" Mobility   Outcome Measure  Help needed turning from your back to your side while in a flat bed without using bedrails?: None Help needed moving from lying on your back to sitting on the side of a flat bed without using bedrails?: A Little Help needed moving to and from a bed to a chair (including a wheelchair)?: A Little Help needed standing up from a chair using your arms (e.g., wheelchair or bedside chair)?: A Little   Help needed climbing 3-5 steps with a railing? : A Little 6 Click Score: 16    End of Session Equipment Utilized During Treatment: Gait belt Activity Tolerance: Patient tolerated treatment well Patient left: with call bell/phone within reach;in bed Nurse Communication: Mobility status PT Visit Diagnosis: Other abnormalities of gait and mobility (R26.89);Muscle weakness (generalized) (M62.81)     Time: 4199-1444 PT Time Calculation (min) (ACUTE ONLY): 11 min  Charges:  $Gait Training: 8-22 mins                     Karma Ganja, PT, DPT   Acute Rehabilitation Department Pager #: [phone]   Otho Bellows 07/24/2020, 2:33 PM

## 2020-07-24 NOTE — TOC Initial Note (Addendum)
Transition of Care [facility]) - Initial/Assessment Note    Patient Details  Name: Patrick Chapman MRN: [id number] Date of Birth: 10-09-1960  Transition of [facility]) CM/SW Contact:    Marilu Favre, RN Phone Number: 07/24/2020, 3:04 PM  Clinical Narrative:                  Ordered patient walker and 3 in1 with Freda Munro with [employer].  Called Cory with [facility] left message.   [place] with [employer]. [place] with [facility] accepted referral start of care next Tuesday. Patient wife aware and has been taught wound care.  Hoyle Sauer with [facility] does not cover address.    [place] , [employer]. Expected Discharge Plan: [place]     Patient Goals and CMS Choice     Choice offered to / list presented to : [facility]  Expected Discharge Plan and Services Expected Discharge Plan: [place]   Discharge Planning Services: CM Consult   Living arrangements for the past 2 months: Single Family Home Expected Discharge Date: 07/24/20               DME Arranged: 3-N-1,Walker rolling DME Agency: [employer] Date DME Agency Contacted: 07/24/20 Time DME Agency Contacted: [phone] Representative spoke with at DME Agency: [place]: PT,RN          Prior Living Arrangements/Services Living arrangements for the past 2 months: Single Family Home   Patient language and need for interpreter reviewed:: Yes Do you feel safe going back to the place where you live?: Yes            Criminal Activity/Legal Involvement Pertinent to Current Situation/Hospitalization: No - Comment as needed  Activities of Daily Living Home Assistive Devices/Equipment: Contact lenses ADL Screening (condition at time of admission) Patient's cognitive ability adequate to safely complete daily activities?: Yes Is the patient deaf or have difficulty hearing?: No Does the patient have difficulty seeing, even  when wearing glasses/contacts?: No Does the patient have difficulty concentrating, remembering, or making decisions?: No Patient able to express need for assistance with ADLs?: Yes Does the patient have difficulty dressing or bathing?: No Independently performs ADLs?: Yes (appropriate for developmental age) Does the patient have difficulty walking or climbing stairs?: No Weakness of Legs: None Weakness of Arms/Hands: None  Permission Sought/Granted                  Emotional Assessment              Admission diagnosis:  Pneumoperitoneum [K66.8] Free intraperitoneal air [K66.8] Abdominal pain, unspecified abdominal location [R10.9] Patient Active Problem List   Diagnosis Date Noted  . Atypical atrial flutter ([place])   . Pneumoperitoneum 07/14/2020  . Abdominal pain   . Diarrhea 03/26/2019  . History of colon polyps 03/26/2019  . Morbid obesity ([place]) 04/26/2017  . Palpitations 04/25/2017  . Cervical spondylosis with radiculopathy 08/15/2014  . LUQ abdominal pain 01/14/2011  . GERD (gastroesophageal reflux disease) 11/25/2010  . Epigastric pain 11/25/2010  . Hyperlipidemia 10/16/2009  . CHEST PAIN UNSPECIFIED 10/16/2009  . OVERWEIGHT 10/13/2009  . Essential hypertension 10/13/2009   PCP:  Celene Squibb, MD Pharmacy:   [facility] [place], [place] [address] Phone: [phone] Fax: [place], [place].  [place] [address] Phone: [phone] Fax: [phone]  [facility] [place], [place] - [address] [place] [address] Phone: [phone] Fax: [phone]     Social Determinants of Health (SDOH) Interventions    Readmission Risk Interventions No flowsheet data found.

## 2020-07-24 NOTE — Progress Notes (Signed)
Occupational Therapy Treatment Patient Details Name: Patrick Chapman MRN: [id number] DOB: Dec 14, 1960 Today's Date: 07/24/2020    History of present illness The pt is a 59 yo male presenting with severe left upper quadrant pain, found to have pneumoperitoneum. S/p ex lap and removal of lap-band on 12/07. Extubated 12/8. Post op developed persistent atrial flutter > TEE/DCCV scheduled for 12/16.  PMH includes: obesity, HTN, GERD, sleep apnea, with lap band in 2009.   OT comments  This 59 yo male admitted and underwent above presents to acute OT with all education completed and pt to D/C home today. He will have his wife to A prn per his report. Acute OT will sign off.  Follow Up Recommendations  No OT follow up;Supervision/Assistance - 24 hour    Equipment Recommendations  None recommended by OT       Precautions / Restrictions Precautions Precautions: Fall Precaution Comments: JP drain removed, wound vac currently disconnected Restrictions Weight Bearing Restrictions: No       Mobility Bed Mobility Overal bed mobility: Needs Assistance Bed Mobility: Supine to Sit     Supine to sit: Min assist;HOB elevated     General bed mobility comments: A for trunk; pt plans on sleeping in recliner when at home  Transfers Overall transfer level: Needs assistance Equipment used: None Transfers: Sit to/from Stand Sit to Stand: Min guard         General transfer comment: min guard A ambulate from bed into bathroom    Balance Overall balance assessment: Needs assistance Sitting-balance support: No upper extremity supported;Feet supported Sitting balance-Leahy Scale: Good     Standing balance support: No upper extremity supported Standing balance-Leahy Scale: Fair                             ADL either performed or assessed with clinical judgement   ADL Overall ADL's : Needs assistance/impaired Eating/Feeding: Independent   Grooming:  Supervision/safety;Standing   Upper Body Bathing: Supervision/ safety;Sitting   Lower Body Bathing: Moderate assistance Lower Body Bathing Details (indicate cue type and reason): min guard A sit<>stand Upper Body Dressing : Set up;Sitting   Lower Body Dressing: Moderate assistance Lower Body Dressing Details (indicate cue type and reason): min guard A sit<>stand; turns sideways on bed to get to feet for socks Toilet Transfer: Min guard;Ambulation;Comfort height toilet;Grab bars         Tub/Shower Transfer Details (indicate cue type and reason): Pt cannot shower for now due to wound vac, but does have a shower seat once he can shower                   Cognition Arousal/Alertness: Awake/alert Behavior During Therapy: WFL for tasks assessed/performed Overall Cognitive Status: Within Functional Limits for tasks assessed                                                     Pertinent Vitals/ Pain       Pain Assessment: 0-10 Pain Score: 4  Pain Location: abdomen Pain Descriptors / Indicators: Discomfort;Sore Pain Intervention(s): Monitored during session            Progress Toward Goals  OT Goals(current goals can now be found in the care plan section)  Progress towards OT goals:  (all education completed and  pt states he will have wife with him to A prn.)  Acute Rehab OT Goals Patient Stated Goal: to go home today  Plan Other (comment);Equipment recommendations need to be updated (all education completed)       AM-PAC OT "6 Clicks" Daily Activity     Outcome Measure   Help from another person eating meals?: None Help from another person taking care of personal grooming?: A Little Help from another person toileting, which includes using toliet, bedpan, or urinal?: A Little Help from another person bathing (including washing, rinsing, drying)?: A Lot Help from another person to put on and taking off regular upper body clothing?: A Little Help  from another person to put on and taking off regular lower body clothing?: A Lot 6 Click Score: 17    End of Session Equipment Utilized During Treatment: Gait belt  OT Visit Diagnosis: Unsteadiness on feet (R26.81);Pain Pain - part of body:  (abdomen)   Activity Tolerance Patient tolerated treatment well   Patient Left  (walking in hallway with PT)           Time: 4827-0786 OT Time Calculation (min): 12 min  Charges: OT General Charges $OT Visit: 1 Visit OT Treatments $Self Care/Home Management : 8-22 mins  [place], OTR/L Acute [employer] Pager [phone] Office [phone]      Almon Register 07/24/2020, 2:04 PM

## 2020-07-24 NOTE — Discharge Instructions (Signed)
Information on my medicine - ELIQUIS (apixaban)  Why was Eliquis prescribed for you? Eliquis was prescribed for you to reduce the risk of forming blood clots that can cause a stroke if you have a medical condition called atrial fibrillation (a type of irregular heartbeat) OR to reduce the risk of a blood clots forming after orthopedic surgery.  What do You need to know about Eliquis ? Take your Eliquis TWICE DAILY - one tablet in the morning and one tablet in the evening with or without food.  It would be best to take the doses about the same time each day.  If you have difficulty swallowing the tablet whole please discuss with your pharmacist how to take the medication safely.  Take Eliquis exactly as prescribed by your doctor and DO NOT stop taking Eliquis without talking to the doctor who prescribed the medication.  Stopping may increase your risk of developing a new clot or stroke.  Refill your prescription before you run out.  After discharge, you should have regular check-up appointments with your healthcare provider that is prescribing your Eliquis.  In the future your dose may need to be changed if your kidney function or weight changes by a significant amount or as you get older.  What do you do if you miss a dose? If you miss a dose, take it as soon as you remember on the same day and resume taking twice daily.  Do not take more than one dose of ELIQUIS at the same time.  Important Safety Information A possible side effect of Eliquis is bleeding. You should call your healthcare provider right away if you experience any of the following: ? Bleeding from an injury or your nose that does not stop. ? Unusual colored urine (red or dark brown) or unusual colored stools (red or black). ? Unusual bruising for unknown reasons. ? A serious fall or if you hit your head (even if there is no bleeding).  Some medicines may interact with Eliquis and might increase your risk of bleeding  or clotting while on Eliquis. To help avoid this, consult your healthcare provider or pharmacist prior to using any new prescription or non-prescription medications, including herbals, vitamins, non-steroidal anti-inflammatory drugs (NSAIDs) and supplements.  This website has more information on Eliquis (apixaban): [email or web address].  [facility], [place] [phone]  OPEN ABDOMINAL SURGERY: POST OP INSTRUCTIONS  Always review your discharge instruction sheet given to you by the facility where your surgery was performed.  IF YOU HAVE DISABILITY OR FAMILY LEAVE FORMS, YOU MUST BRING THEM TO THE OFFICE FOR PROCESSING.  PLEASE DO NOT GIVE THEM TO YOUR DOCTOR.  1. A prescription for pain medication may be given to you upon discharge.  Take your pain medication as prescribed, if needed.  If narcotic pain medicine is not needed, then you may take acetaminophen (Tylenol) or ibuprofen (Advil) as needed. 2. Take your usually prescribed medications unless otherwise directed. 3. If you need a refill on your pain medication, please contact your pharmacy. They will contact our office to request authorization.  Prescriptions will not be filled after 5pm or on week-ends. 4. You should follow a light diet the first few days after arrival home, such as soup and crackers, pudding, etc.unless your doctor has advised otherwise. A high-fiber, low fat diet can be resumed as tolerated.   Be sure to include lots of fluids daily. Most patients will experience some swelling and bruising on the chest and  neck area.  Ice packs will help.  Swelling and bruising can take several days to resolve 5. Most patients will experience some swelling and bruising in the area of the incision. Ice pack will help. Swelling and bruising can take several days to resolve..  6. It is common to experience some constipation if taking pain medication after surgery.  Increasing fluid intake and taking a stool softener will usually  help or prevent this problem from occurring.  A mild laxative (Milk of Magnesia or Miralax) should be taken according to package directions if there are no bowel movements after 48 hours. 7.  You may have steri-strips (small skin tapes) in place directly over the incision.  These strips should be left on the skin for 7-10 days.  If your surgeon used skin glue on the incision, you may shower in 24 hours.  The glue will flake off over the next 2-3 weeks.  Any sutures or staples will be removed at the office during your follow-up visit. You may find that a light gauze bandage over your incision may keep your staples from being rubbed or pulled. You may shower and replace the bandage daily. 8. ACTIVITIES:  You may resume regular (light) daily activities beginning the next day--such as daily self-care, walking, climbing stairs--gradually increasing activities as tolerated.  You may have sexual intercourse when it is comfortable.  Refrain from any heavy lifting or straining until approved by your doctor. a. You may drive when you no longer are taking prescription pain medication, you can comfortably wear a seatbelt, and you can safely maneuver your car and apply brakes b. Return to Work: ___________________________________ 22. You should see your doctor in the office for a follow-up appointment approximately two weeks after your surgery.  Make sure that you call for this appointment within a day or two after you arrive home to insure a convenient appointment time. OTHER INSTRUCTIONS:  _____________________________________________________________ _____________________________________________________________  WHEN TO CALL YOUR DOCTOR: 1. Fever over 101.0 2. Inability to urinate 3. Nausea and/or vomiting 4. Extreme swelling or bruising 5. Continued bleeding from incision. 6. Increased pain, redness, or drainage from the incision. 7. Difficulty swallowing or breathing 8. Muscle cramping or  spasms. 9. Numbness or tingling in hands or feet or around lips.  The clinic staff is available to answer your questions during regular business hours.  Please don't hesitate to call and ask to speak to one of the nurses if you have concerns.  For further questions, please visit [email or web address]    Wet to Dry WOUND CARE: - Change dressing twice daily - Supplies: sterile saline, kerlex, scissors, ABD pads, tape  1. Remove dressing and all packing carefully, moistening with sterile saline as needed to avoid packing/internal dressing sticking to the wound. 2.   Clean edges of skin around the wound with water/gauze, making sure there is no tape debris or leakage left on skin that could cause skin irritation or breakdown. 3.   Dampen and clean kerlex with sterile saline and pack wound from wound base to skin level, making sure to take note of any possible areas of wound tracking, tunneling and packing appropriately. Wound can be packed loosely. Trim kerlex to size if a whole kerlex is not required. 4.   Cover wound with a dry ABD pad and secure with tape.  5.   Write the date/time on the dry dressing/tape to better track when the last dressing change occurred. - apply any skin protectant/powder if recommended by clinician to protect  skin/skin folds. - change dressing as needed if leakage occurs, wound gets contaminated, or patient requests to shower. - You may shower daily with wound open and following the shower the wound should be dried and a clean dressing placed.  - Medical grade tape as well as packing supplies can be found at [employer] on [address] or [employer] on [address]. The remaining supplies can be found at your local drug store, [place] etc.

## 2020-07-24 NOTE — Plan of Care (Signed)
Patient VS stable and tolerates OOB activity. Pain controlled with PO prn medications. Abdominal incision changed from wound vac to moist to dry dressing. Wife at bedside observed and recorded instructions for care. Printed instructions also reviewed.   Discharge AVS reviewed with patient and wife. All questions and concerned were addressed. All personal belongings accounted for. Awaiting home BSC to discharge home.

## 2020-07-24 NOTE — Progress Notes (Signed)
1 Day Post-Op   Subjective/Chief Complaint: Ate well Having diarrhea Wants to go home    Objective: Vital signs in last 24 hours: Temp:  [98.1 F (36.7 C)-98.7 F (37.1 C)] 98.2 F (36.8 C) (12/17 0400) Pulse Rate:  [73-90] 79 (12/17 0730) Resp:  [14-22] 20 (12/17 0730) BP: (108-144)/(66-99) 110/66 (12/17 0700) SpO2:  [93 %-100 %] 96 % (12/17 0730) Last BM Date: 07/23/20  Intake/Output from previous day: 12/16 0701 - 12/17 0700 In: 1891.5 [I.V.:1791.5; IV Piggyback:100.1] Out: 1250 [Urine:900; Drains:150; Stool:200] Intake/Output this shift: No intake/output data recorded.  General appearance: cooperative Resp: clear to auscultation bilaterally Cardio: regular rate and rhythm GI: soft, drain SS, VAC Neurologic: Mental status: Alert, oriented, thought content appropriate  Lab Results:  Recent Labs    07/23/20 0047 07/24/20 0228  WBC 9.4 8.7  HGB 11.1* 10.6*  HCT 33.0* 31.5*  PLT 281 330   BMET Recent Labs    07/23/20 0047 07/24/20 0228  NA 137 138  K 4.0 3.8  CL 101 100  CO2 25 27  GLUCOSE 113* 116*  BUN 11 11  CREATININE 1.09 1.11  CALCIUM 8.1* 8.1*   PT/INR Recent Labs    07/23/20 0545  LABPROT 17.0*  INR 1.4*   ABG No results for input(s): PHART, HCO3 in the last 72 hours.  Invalid input(s): PCO2, PO2  Studies/Results: DG Abd Portable 1V  Result Date: 07/22/2020 CLINICAL DATA:  Postoperative ileus EXAM: PORTABLE ABDOMEN - 1 VIEW COMPARISON:  July 20, 2020 FINDINGS: Stomach is diffusely distended with air. There are loops of dilated small bowel without air-fluid levels appreciable. A small amount of contrast is seen in the right colon. No free air appreciable on supine examination. Surgical clips in right upper abdomen. Lung bases are clear. IMPRESSION: Gastric distension with air. Loops of dilated small bowel, likely representing a degree of ileus. Contrast noted in right colon. No free air evident on supine examination. Lung bases clear.  Electronically Signed   By: Lowella Grip III M.D.   On: 07/22/2020 11:20   ECHO TEE  Result Date: 07/23/2020    TRANSESOPHOGEAL ECHO REPORT   Patient Name:   Patrick Chapman Date of Exam: 07/23/2020 Medical Rec #:  [id number]      Height:       72.0 in Accession #:    [id number]     Weight:       290.0 lb Date of Birth:  September 10, 1960     BSA:          2.495 m Patient Age:    59 years       BP:           109/72 mmHg Patient Gender: M              HR:           137 bpm. Exam Location:  Inpatient Procedure: Transesophageal Echo, Limited Color Doppler and Cardiac Doppler Indications:     Atrial Flutter  History:         Patient has prior history of Echocardiogram examinations, most                  recent 07/15/2020. Risk Factors:Hypertension.  Sonographer:     Mikki Santee RDCS (AE) Referring Phys:  [id number] Leanor Kail Diagnosing Phys: Mertie Moores MD PROCEDURE: After discussion of the risks and benefits of a TEE, an informed consent was obtained. The transesophogeal probe was passed without difficulty through the esophogus of  the patient. Sedation performed by different physician. The patient was monitored while under deep sedation. Anesthestetic sedation was provided intravenously by Anesthesiology: 220.92mg  of Propofol, 60mg  of Lidocaine. The patient developed no complications during the procedure. IMPRESSIONS  1. Left ventricular ejection fraction, by estimation, is 55 to 60%. The left ventricle has normal function. The left ventricle has no regional wall motion abnormalities.  2. Right ventricular systolic function is normal. The right ventricular size is normal.  3. No left atrial/left atrial appendage thrombus was detected.  4. The mitral valve is normal in structure. No evidence of mitral valve regurgitation.  5. The aortic valve is normal in structure. Aortic valve regurgitation is not visualized. No aortic stenosis is present. FINDINGS  Left Ventricle: Left ventricular ejection fraction, by  estimation, is 55 to 60%. The left ventricle has normal function. The left ventricle has no regional wall motion abnormalities. The left ventricular internal cavity size was normal in size. Right Ventricle: The right ventricular size is normal. No increase in right ventricular wall thickness. Right ventricular systolic function is normal. Left Atrium: Left atrial size was normal in size. No left atrial/left atrial appendage thrombus was detected. Right Atrium: Right atrial size was normal in size. Pericardium: There is no evidence of pericardial effusion. Mitral Valve: The mitral valve is normal in structure. No evidence of mitral valve regurgitation. Tricuspid Valve: The tricuspid valve is normal in structure. Tricuspid valve regurgitation is not demonstrated. Aortic Valve: The aortic valve is normal in structure. Aortic valve regurgitation is not visualized. No aortic stenosis is present. Pulmonic Valve: The pulmonic valve was normal in structure. Pulmonic valve regurgitation is not visualized. Aorta: The aortic root and ascending aorta are structurally normal, with no evidence of dilitation. IAS/Shunts: The atrial septum is grossly normal. Mertie Moores MD Electronically signed by Mertie Moores MD Signature Date/Time: 07/23/2020/2:32:01 PM    Final     Anti-infectives: Anti-infectives (From admission, onward)   Start     Dose/Rate Route Frequency Ordered Stop   07/14/20 0900  piperacillin-tazobactam (ZOSYN) IVPB 3.375 g  Status:  Discontinued        3.375 g 100 mL/hr over 30 Minutes Intravenous Every 8 hours 07/14/20 0800 07/14/20 0802   07/14/20 0900  piperacillin-tazobactam (ZOSYN) IVPB 3.375 g        3.375 g 12.5 mL/hr over 240 Minutes Intravenous Every 8 hours 07/14/20 0804 07/19/20 2120   07/14/20 0515  ceFAZolin (ANCEF) IVPB 2g/100 mL premix  Status:  Discontinued        2 g 200 mL/hr over 30 Minutes Intravenous  Once 07/14/20 0501 07/14/20 0753   07/14/20 0115  piperacillin-tazobactam  (ZOSYN) IVPB 3.375 g        3.375 g 100 mL/hr over 30 Minutes Intravenous  Once 07/14/20 0111 07/14/20 0157      Assessment/Plan: POD 10 s/p ex lap with removal of lap band for pneumoperitoneum - source near his lap band with bubbling under water so this was removed - UGI normal - VAC change MWF - D/C JP - PT/OT eval for mobilization - diet A flutter - S/P TEE/cardioversion 12/16, in NSR FEN - heart healthy ID - s/p 5d zosyn DVT: on doac, scds Dispo: to floor, will see about possible D/C this PM if OK with Cardiology  LOS: 10 days    Zenovia Jarred 07/24/2020

## 2020-07-29 NOTE — Discharge Summary (Deleted)
[facility] Discharge Summary   Patient ID: EZREAL TURAY MRN: [id number] DOB/AGE: [birth year] 59 y.o.  Admit date: 07/14/2020 Discharge date: 07/24/2020   Discharge Diagnosis Pneumoperitoneum S/p abdominal surgery Atrial flutter  Consultants Cardiology - atrial flutter  Imaging: CT ABDOMEN/PELVIS 07/14/20  IMPRESSION: 1. Probable focal wall thickening inflammatory changes seen around the gastroduodenal junction and mid duodenum be due to perforated ulcer or duodenitis. 2. Also findings of acute pancreatitis involving the pancreatic head. No evidence of pancreatic necrosis or loculated fluid collections. 3. Extensive portal venous gas and findings suggestive pneumatosis within the proximal small bowel. 4. Large amount of pneumoperitoneum 5.  Aortic Atherosclerosis (ICD10-I70.0). 6. These results were called by telephone at the time of  CT ANGIO CHEST PE 07/17/20 IMPRESSION: 1. Limited examination due to body habitus and breathing motion artifact. 2. No definite CT findings for pulmonary embolism. 3. Normal caliber thoracic aorta. 4. Enlarged pulmonary arterial trunk and right and left main pulmonary arteries suggesting pulmonary hypertension. 5. Small bilateral pleural effusions and areas of atelectasis. 6. Acute pancreatitis. 7. Aortic atherosclerosis.  DG UGI W/ SINGLE CM 07/20/20  IMPRESSION: Water-soluble upper GI administered through NG tube reveals no abnormality the stomach or duodenum. Negative for leak.   Procedures Dr. Rolm Bookbinder 07/14/2020 - exploratory laparotomy, removal of lap band, application of wound VAC  Dr. Mertie Moores 07/23/20 - TEE, DC cardioversion  HPI: 59 yom with history 2009 of lap band and prior lap chole presents with significant upper abdominal pain that started today and has progressed.  He has been vomiting.  No change in bms, no fevers.  Pain worsening with nothing he was doing at home helping. It is  constant.  He is very distended now.  He had [facility] with finding of pneumoperitoneum and was transferred to [facility].    Hospital course: see below problem list along with management   Pneumoperitoneum  s/p ex lap with removal of lap band for pneumoperitoneum - source near his lap band with bubbling under water so this was removed - UGI normal (above) post-op normal and then diet was advanced. - VAC change MWF  A flutter- went into atrial flutter shortly after midnight in the morning of 07/17/2020.  He was treated with Plavix, IV Lopressor, IV fluid.  CTA of the chest was negative for PE (above).  Despite rate control medication, heart rate did not improved.  patient underwent successful TEEcardioversion 12/16, in NSR. Final medications recs from cardiology as below.  On 07/24/20 patients vitals were stable, pain controlled, tolerating PO, mobilizing, and felt stable for discharge home. Follow up as below.  I did not personally see this patient during his admission, therefore the above information was obtained from chart review.  Allergies as of 07/24/2020      Reactions   Codeine Nausea And Vomiting, Rash      Medication List    STOP taking these medications   Azor 5-20 MG tablet Generic drug: amLODipine-olmesartan   dicyclomine 10 MG capsule Commonly known as: BENTYL   metoprolol tartrate 50 MG tablet Commonly known as: LOPRESSOR     TAKE these medications   acetaminophen 500 MG tablet Commonly known as: TYLENOL Take 1,000 mg by mouth every 6 (six) hours as needed for moderate pain or headache.   amiodarone 200 MG tablet Commonly known as: PACERONE Take 1 tablet (200 mg total) by mouth 2 (two) times daily for 6 days.   amiodarone 200 MG tablet Commonly known as: Pacerone  Take 1 tablet (200 mg total) by mouth daily for 21 days. Start taking on: July 30, 2020   apixaban 5 MG Tabs tablet Commonly known as: ELIQUIS Take 1 tablet (5 mg total) by mouth 2  (two) times daily.   ascorbic acid 500 MG tablet Commonly known as: VITAMIN C Take 500 mg by mouth daily.   dexlansoprazole 60 MG capsule Commonly known as: DEXILANT Take 1 capsule (60 mg total) by mouth daily. What changed: when to take this   loratadine 10 MG tablet Commonly known as: CLARITIN Take 10 mg by mouth daily.   losartan 25 MG tablet Commonly known as: COZAAR Take 1 tablet (25 mg total) by mouth daily.   metoprolol succinate 50 MG 24 hr tablet Commonly known as: TOPROL-XL Take 1 tablet (50 mg total) by mouth daily. Take with or immediately following a meal.   oxyCODONE 5 MG immediate release tablet Commonly known as: Oxy IR/ROXICODONE Take 1 tablet (5 mg total) by mouth every 6 (six) hours as needed.   tamsulosin 0.4 MG Caps capsule Commonly known as: FLOMAX Take 0.4 mg by mouth in the morning and at bedtime.   Vitamin D-3 125 MCG (5000 UT) Tabs Take 5,000 Units by mouth daily.         Follow-up Information    Celene Squibb, MD. Call.   Specialty: Internal Medicine Why: Follow up with your primary care physician in 1 week for blood work (BMP) per your cardiologist Contact information: [address] [place] [facility] [address] [phone]        Imogene Burn, PA-C Follow up on 08/18/2020.   Specialty: Cardiology Why: at 1015am  Contact information: [place] [address] [phone]        Rolm Bookbinder, MD. Call.   Specialty: General Surgery Why: We are working on your appointment, call to confirm. Please arrive 30 minutes prior to your appointment to check in and fill out paperwork. Bring photo ID and insurance information. Contact information: [address] [place] [address] [phone]        [facility] Follow up.   Specialty: Home Health Services Contact information: [address] [place] [address] [phone]               Signed: Obie Dredge,  [facility] 07/29/2020, 4:08 PM

## 2020-08-11 NOTE — Progress Notes (Signed)
Cardiology Office Note    Date:  08/18/2020   ID:  Patrick KIBBEY, DOB 01/27/1961, MRN [id number]  PCP:  Celene Squibb, MD  Cardiologist: Carlyle Dolly, MD EPS: None  Chief Complaint  Patient presents with   Hospitalization Follow-up    History of Present Illness:  Patrick Chapman is a 60 y.o. male with history of hypertension and palpitations who was admitted to the hospital with sepsis secondary to bowel perforation and was found to be in atrial flutter.  He underwent successful TEE DCCV on Eliquis and amiodarone.  Plan was to stop the amiodarone after 3 weeks.  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  Plan is to repeat echo in 3 months.  Continue Toprol-XL 50 mg once daily and losartan.  Patient comes in accompanied by his wife. HR and BP have been good at home. Occasional heart palpitations-skip.  Has a couple more days of Amiodarone.  No bleeding problems on eliquis.     Past Medical History:  Diagnosis Date   GERD (gastroesophageal reflux disease)    Hypertension    Obesity    Sleep apnea    was tested, but had lap band surgery and with weight loss, no problems    Past Surgical History:  Procedure Laterality Date   ANTERIOR CERVICAL DECOMP/DISCECTOMY FUSION N/A 08/15/2014   Procedure: ANTERIOR CERVICAL DECOMPRESSION/DISCECTOMY FUSION 1 LEVEL;  Surgeon: Consuella Lose, MD;  Location: [facility];  Service: Neurosurgery;  Laterality: N/A;  C56 anterior cervical fusion with interbody prosthesis plating and bonegraft C 5-6   APPLICATION OF WOUND VAC N/A 07/14/2020   Procedure: APPLICATION OF WOUND VAC;  Surgeon: Rolm Bookbinder, MD;  Location: [facility];  Service: General;  Laterality: N/A;   BIOPSY  10/10/2019   Procedure: BIOPSY;  Surgeon: Daneil Dolin, MD;  Location: [facility];  Service: Endoscopy;;   CARDIOVERSION N/A 07/23/2020   Procedure: CARDIOVERSION;   Surgeon: Thayer Headings, MD;  Location[facility] ENDOSCOPY;  Service: Cardiovascular;  Laterality: N/A;   CHOLECYSTECTOMY     COLONOSCOPY WITH ESOPHAGOGASTRODUODENOSCOPY (EGD)  06/12/2012   Dr. Gala Romney: Status post prior lap band placement, otherwise normal study.  2 tubular adenomas removed from the colon.  Next colonoscopy in 5 years.   COLONOSCOPY WITH PROPOFOL N/A 10/10/2019   Procedure: COLONOSCOPY WITH PROPOFOL;  Surgeon: Daneil Dolin, MD;  [facility];  Service: Endoscopy;  Laterality: N/A;  11:00am   LAPAROSCOPIC GASTRIC BANDING  2009   Dr. Hassell Done   LAPAROTOMY N/A 07/14/2020   Procedure: EXPLORATORY LAPAROTOMY WITH REMOVAL OF LAP BAND AND COMPONENTS;  Surgeon: Rolm Bookbinder, MD;  Location: [facility];  Service: General;  Laterality: N/A;   TEE WITHOUT CARDIOVERSION N/A 07/23/2020   Procedure: TRANSESOPHAGEAL ECHOCARDIOGRAM (TEE);  Surgeon: Thayer Headings, MD;  Location: [facility] ENDOSCOPY;  Service: Cardiovascular;  Laterality: N/A;    Current Medications: Current Meds  Medication Sig   amiodarone (PACERONE) 200 MG tablet Take 1 tablet (200 mg total) by mouth daily for 21 days.   apixaban (ELIQUIS) 5 MG TABS tablet Take 1 tablet (5 mg total) by mouth 2 (two) times daily.   ascorbic acid (VITAMIN C) 500 MG tablet Take 500 mg by mouth daily.   Cholecalciferol (VITAMIN D-3) 125 MCG (5000 UT) TABS Take 5,000 Units by mouth daily.   dexlansoprazole (DEXILANT) 60 MG capsule Take 1 capsule (60 mg total)  by mouth daily. (Patient taking differently: Take 60 mg by mouth 2 (two) times daily.)   loratadine (CLARITIN) 10 MG tablet Take 10 mg by mouth daily.   losartan (COZAAR) 25 MG tablet Take 1 tablet (25 mg total) by mouth daily.   metoprolol succinate (TOPROL-XL) 50 MG 24 hr tablet Take 1 tablet (50 mg total) by mouth daily. Take with or immediately following a meal.   tamsulosin (FLOMAX) 0.4 MG CAPS capsule Take 0.4 mg by mouth in the morning and at bedtime.    traMADol  (ULTRAM) 50 MG tablet Take 50 mg by mouth every 6 (six) hours as needed.     Allergies:   Codeine   Social History   Socioeconomic History   Marital status: Married    Spouse name: Not on file   Number of children: Not on file   Years of education: Not on file   Highest education level: Not on file  Occupational History   Not on file  Tobacco Use   Smoking status: Former Smoker    Packs/day: 1.00    Years: 10.00    Pack years: 10.00    Types: Cigarettes    Start date: 05/23/1977    Quit date: 05/24/1987    Years since quitting: 33.2   Smokeless tobacco: Current User    Types: Snuff  Vaping Use   Vaping Use: Never used  Substance and Sexual Activity   Alcohol use: Not Currently    Alcohol/week: 0.0 standard drinks    Comment: occassional   Drug use: No   Sexual activity: Yes    Partners: Female    Birth control/protection: Surgical    Comment: spouse  Other Topics Concern   Not on file  Social History Narrative   Not on file   Social Determinants of Health   Financial Resource Strain: Not on file  Food Insecurity: Not on file  Transportation Needs: Not on file  Physical Activity: Not on file  Stress: Not on file  Social Connections: Not on file     Family History:  The patient's family history includes Diabetes in his father; Heart attack in his father and mother.   ROS:   Please see the history of present illness.    ROS All other systems reviewed and are negative.   PHYSICAL EXAM:   VS:  BP (!) 114/58    Pulse 71    Resp 18    Ht 6' (1.829 m)    Wt 284 lb 12.8 oz (129.2 kg)    SpO2 100%    BMI 38.63 kg/m   Physical Exam  GEN: Obese, in no acute distress  Neck: no JVD, carotid bruits, or masses Cardiac:RRR; no murmurs, rubs, or gallops  Respiratory:  clear to auscultation bilaterally, normal work of breathing GI: soft, nontender, nondistended, + BS Ext: without cyanosis, clubbing, or edema, Good distal pulses bilaterally Neuro:  Alert  and Oriented x 3 Psych: euthymic mood, full affect  Wt Readings from Last 3 Encounters:  08/18/20 284 lb 12.8 oz (129.2 kg)  07/14/20 290 lb (131.5 kg)  11/19/19 272 lb (123.4 kg)      Studies/Labs Reviewed:   EKG:  EKG is not ordered today.    Recent Labs: 07/16/2020: ALT 33 07/17/2020: B Natriuretic Peptide 112.2; TSH 11.164 07/19/2020: Magnesium 2.0 07/24/2020: BUN 11; Creatinine, Ser 1.11; Hemoglobin 10.6; Platelets 330; Potassium 3.8; Sodium 138   Lipid Panel    Component Value Date/Time   CHOL 207 (H) 04/26/2018  1013   TRIG 227 (H) 07/15/2020 0510   HDL 32 (L) 04/26/2018 1013   CHOLHDL 6.5 04/26/2018 1013   VLDL 36 04/26/2018 1013   LDLCALC 139 (H) 04/26/2018 1013    Additional studies/ records that were reviewed today include:  Echo 07/15/2020  1. Left ventricular ejection fraction, by estimation, is 60 to 65%. The  left ventricle has normal function. The left ventricle has no regional  wall motion abnormalities. There is mild left ventricular hypertrophy.  Left ventricular diastolic parameters  are indeterminate.   2. Right ventricular systolic function is normal. The right ventricular  size is normal. Tricuspid regurgitation signal is inadequate for assessing  PA pressure.   3. The mitral valve is normal in structure. No evidence of mitral valve  regurgitation.   4. The aortic valve was not well visualized. Aortic valve regurgitation  is not visualized. No aortic stenosis is present.       Risk Assessment/Calculations:     CHA2DS2-VASc Score = 1  This indicates a 0.6% annual risk of stroke. The patient's score is based upon: CHF History: No HTN History: Yes Diabetes History: No Stroke History: No Vascular Disease History: No Age Score: 0 Gender Score: 0         ASSESSMENT:    1. Atypical atrial flutter ([place])   2. Chronic combined systolic and diastolic CHF (congestive heart failure) ([place])   3. Essential hypertension      PLAN:  In  order of problems listed above:  Atrial flutter status post TEE/DCCV 07/23/2020 converted to normal sinus rhythm with amiodarone with plans to stop as an outpatient in 3 weeks on Toprol and Eliquis.  This occurred in the setting of sepsis/bowel perforation with difficult to control rates. Heart rate has been well controlled. Occasional skip. To finish amiodarone in a couple days. Will f/u echo in March with f/u Dr. Harl Bowie.  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  Echo 07/15/2020 EF 60 to 65%.  Plan repeat echo in 3 months-schedule for March  Status post exploratory laparotomy for removal of LAP-BAND for pneumoperitoneum  Hypertension well controlled  Shared Decision Making/Informed Consent        Medication Adjustments/Labs and Tests Ordered: Current medicines are reviewed at length with the patient today.  Concerns regarding medicines are outlined above.  Medication changes, Labs and Tests ordered today are listed in the Patient Instructions below. Patient Instructions  Medication Instructions:  Your physician recommends that you continue on your current medications as directed. Please refer to the Current Medication list given to you today.  *If you need a refill on your cardiac medications before your next appointment, please call your pharmacy*   Lab Work: NONE   If you have labs (blood work) drawn today and your tests are completely normal, you will receive your results only by:  [facility] (if you have MyChart) OR  A paper copy in the mail If you have any lab test that is abnormal or we need to change your treatment, we will call you to review the results.   Testing/Procedures: Your physician has requested that you have an echocardiogram. Echocardiography is a painless test that uses sound waves to create images of your heart. It provides your doctor with information about the size and  shape of your heart and how well your hearts chambers and valves are working. This procedure takes approximately one hour. There are no restrictions  for this procedure.     Follow-Up: At [facility], you and your health needs are our priority.  As part of our continuing mission to provide you with exceptional heart care, we have created designated Provider Care Teams.  These Care Teams include your primary Cardiologist (physician) and Advanced Practice Providers (APPs -  Physician Assistants and Nurse Practitioners) who all work together to provide you with the care you need, when you need it.  We recommend signing up for the patient portal called "MyChart".  Sign up information is provided on this After Visit Summary.  MyChart is used to connect with patients for Virtual Visits (Telemedicine).  Patients are able to view lab/test results, encounter notes, upcoming appointments, etc.  Non-urgent messages can be sent to your provider as well.   To learn more about what you can do with MyChart, go to [email or web address].    Your next appointment:    After Echo   The format for your next appointment:   In Person  Provider:   Carlyle Dolly, MD   Other Instructions Thank you for choosing [facility]!       Sumner Boast, PA-C  08/18/2020 10:29 AM    [facility] [place], [place], [place]  [address] Phone: [phone]; Fax: [phone]

## 2020-08-18 ENCOUNTER — Ambulatory Visit (INDEPENDENT_AMBULATORY_CARE_PROVIDER_SITE_OTHER): Payer: PRIVATE HEALTH INSURANCE | Admitting: Physician Assistant

## 2020-08-18 ENCOUNTER — Encounter: Payer: Self-pay | Admitting: Physician Assistant

## 2020-08-18 ENCOUNTER — Other Ambulatory Visit: Payer: Self-pay

## 2020-08-18 VITALS — BP 114/58 | HR 71 | Resp 18 | Ht 72.0 in | Wt 284.8 lb

## 2020-08-18 DIAGNOSIS — I484 Atypical atrial flutter: Secondary | ICD-10-CM | POA: Diagnosis not present

## 2020-08-18 DIAGNOSIS — I5042 Chronic combined systolic (congestive) and diastolic (congestive) heart failure: Secondary | ICD-10-CM

## 2020-08-18 DIAGNOSIS — I10 Essential (primary) hypertension: Secondary | ICD-10-CM

## 2020-08-18 NOTE — Patient Instructions (Signed)
Medication Instructions:  Your physician recommends that you continue on your current medications as directed. Please refer to the Current Medication list given to you today.  *If you need a refill on your cardiac medications before your next appointment, please call your pharmacy*   Lab Work: NONE   If you have labs (blood work) drawn today and your tests are completely normal, you will receive your results only by: Marland Kitchen MyChart Message (if you have MyChart) OR . A paper copy in the mail If you have any lab test that is abnormal or we need to change your treatment, we will call you to review the results.   Testing/Procedures: Your physician has requested that you have an echocardiogram. Echocardiography is a painless test that uses sound waves to create images of your heart. It provides your doctor with information about the size and shape of your heart and how well your heart's chambers and valves are working. This procedure takes approximately one hour. There are no restrictions for this procedure.     Follow-Up: At [facility], you and your health needs are our priority.  As part of our continuing mission to provide you with exceptional heart care, we have created designated Provider Care Teams.  These Care Teams include your primary Cardiologist (physician) and Advanced Practice Providers (APPs -  Physician Assistants and Nurse Practitioners) who all work together to provide you with the care you need, when you need it.  We recommend signing up for the patient portal called "MyChart".  Sign up information is provided on this After Visit Summary.  MyChart is used to connect with patients for Virtual Visits (Telemedicine).  Patients are able to view lab/test results, encounter notes, upcoming appointments, etc.  Non-urgent messages can be sent to your provider as well.   To learn more about what you can do with MyChart, go to [email or web address].    Your next appointment:    After  Echo   The format for your next appointment:   In Person  Provider:   Carlyle Dolly, MD   Other Instructions Thank you for choosing [facility]!

## 2020-08-21 ENCOUNTER — Telehealth: Payer: Self-pay | Admitting: Physician Assistant

## 2020-08-21 MED ORDER — LOSARTAN POTASSIUM 25 MG PO TABS: 25.0000 mg | ORAL_TABLET | Freq: Every day | ORAL | 11 refills | Status: DC

## 2020-08-21 MED ORDER — APIXABAN 5 MG PO TABS: 5.0000 mg | ORAL_TABLET | Freq: Two times a day (BID) | ORAL | 11 refills | Status: DC

## 2020-08-21 NOTE — Telephone Encounter (Signed)
New message    *STAT* If patient is at the pharmacy, call can be transferred to refill team.   1. Which medications need to be refilled? (please list name of each medication and dose if known)  losartan (COZAAR) 25 MG tablet Take 1 tablet (25 mg total) by mouth daily.   metoprolol succinate (TOPROL-XL) 50 MG 24 hr tablet  apixaban (ELIQUIS) 5 MG TABS tablet   2. Which pharmacy/location (including street and city if local pharmacy) is medication to be sent to? [facility]   3. Do they need a 30 day or 90 day supply? [facility]

## 2020-08-21 NOTE — Telephone Encounter (Signed)
Refilled per request.

## 2020-08-22 ENCOUNTER — Telehealth: Payer: Self-pay | Admitting: Nurse Practitioner

## 2020-08-22 ENCOUNTER — Encounter: Payer: Self-pay | Admitting: Physician Assistant

## 2020-08-22 ENCOUNTER — Other Ambulatory Visit: Payer: Self-pay | Admitting: Nurse Practitioner

## 2020-08-22 MED ORDER — METOPROLOL SUCCINATE ER 50 MG PO TB24: 50.0000 mg | ORAL_TABLET | Freq: Every day | ORAL | 3 refills | Status: DC

## 2020-08-22 NOTE — Progress Notes (Signed)
   Pt called as he is about to run out of toprol.  He was just seen in clinic this week.  I sent in a refill for toprol xl 50mg 1 PO daily #90 w/ three refills.  Caller verbalized understanding and was grateful for the call back.  Treyten Monestime, NP 08/22/2020, 1:13 PM   

## 2020-08-22 NOTE — Telephone Encounter (Signed)
This encounter was created in error - please disregard.

## 2020-08-22 NOTE — Telephone Encounter (Signed)
   Pt called as he is about to run out of toprol.  He was just seen in clinic this week.  I sent in a refill for toprol xl 50mg  1 PO daily #90 w/ three refills.  Caller verbalized understanding and was grateful for the call back.  Murray Hodgkins, NP 08/22/2020, 1:13 PM

## 2020-08-26 NOTE — Discharge Summary (Signed)
[facility] Discharge Summary   Patient ID: Patrick Chapman MRN: [id number] DOB/AGE: March 09, 1961 60 y.o.  Admit date: 07/14/2020 Discharge date: 07/24/2020   Discharge Diagnosis Pneumoperitoneum S/p abdominal surgery Atrial flutter  Consultants Cardiology - atrial flutter  Imaging: CT ABDOMEN/PELVIS 07/14/20  IMPRESSION: 1. Probable focal wall thickening inflammatory changes seen around the gastroduodenal junction and mid duodenum be due to perforated ulcer or duodenitis. 2. Also findings of acute pancreatitis involving the pancreatic head. No evidence of pancreatic necrosis or loculated fluid collections. 3. Extensive portal venous gas and findings suggestive pneumatosis within the proximal small bowel. 4. Large amount of pneumoperitoneum 5. Aortic Atherosclerosis (ICD10-I70.0). 6. These results were called by telephone at the time of  CT ANGIO CHEST PE 07/17/20 IMPRESSION: 1. Limited examination due to body habitus and breathing motion artifact. 2. No definite CT findings for pulmonary embolism. 3. Normal caliber thoracic aorta. 4. Enlarged pulmonary arterial trunk and right and left main pulmonary arteries suggesting pulmonary hypertension. 5. Small bilateral pleural effusions and areas of atelectasis. 6. Acute pancreatitis. 7. Aortic atherosclerosis.  DG UGI W/ SINGLE CM 07/20/20  IMPRESSION: Water-soluble upper GI administered through NG tube reveals no abnormality the stomach or duodenum. Negative for leak.   Procedures Dr. Rolm Bookbinder 07/14/2020 - exploratory laparotomy, removal of lap band, application of wound VAC  Dr. Mertie Moores 07/23/20 - TEE, DC cardioversion  HPI: 81 yom with history 2009 of lap band and prior lap chole presents with significant upper abdominal pain that started today and has progressed. He has been vomiting. No change in bms, no fevers. Pain worsening with nothing he was doing at home helping. It  is constant. He is very distended now. He had [facility] with finding of pneumoperitoneum and was transferred to [facility].   Hospital course: see below problem list along with management   Pneumoperitoneum  s/p ex lap with removal of lap band for pneumoperitoneum - source near his lap band with bubbling under water so this was removed - UGI normal (above) post-op normal and then diet was advanced. - VAC change MWF  Severe Sepsis, present on admission - secondary to above, pneumoperitoneum. Tachycardic, tachypneic, leukocytosis, systolic BP 90, and severe lactic aciodis on admission. CCM was consulted for assistance in management. The patient Required IV fluids, IV abx, and pressor support post-operatively. Weaned off of pressors by POD#2. On POD#2 the patients sepsis resolved, HR WNL, RR WNL, normotensive off of pressure support, making adequate urine.   AKI - resolved with IVF  A flutter-went into atrial flutter shortly after midnight in the morning of 07/17/2020. He was treated with Plavix, IV Lopressor, IV fluid. CTA of the chest was negative for PE (above). Despite rate control medication, heart rate did not improved. patient underwent successful TEEcardioversion12/16, in NSR. Final medications recs from cardiology as below.  On 07/24/20 patients vitals were stable, pain controlled, tolerating PO, mobilizing, and felt stable for discharge home. Follow up as below.  I did not personally see this patient during his admission, therefore the above information was obtained from chart review.       Allergies as of 07/24/2020      Reactions   Codeine Nausea And Vomiting, Rash         Medication List    STOP taking these medications   Azor 5-20 MG tablet Generic drug: amLODipine-olmesartan   dicyclomine 10 MG capsule Commonly known as: BENTYL   metoprolol tartrate 50 MG tablet Commonly known as: [employer]  TAKE these medications   acetaminophen 500  MG tablet Commonly known as: TYLENOL Take 1,000 mg by mouth every 6 (six) hours as needed for moderate pain or headache.   amiodarone 200 MG tablet Commonly known as: PACERONE Take 1 tablet (200 mg total) by mouth 2 (two) times daily for 6 days.   amiodarone 200 MG tablet Commonly known as: Pacerone Take 1 tablet (200 mg total) by mouth daily for 21 days. Start taking on: July 30, 2020   apixaban 5 MG Tabs tablet Commonly known as: ELIQUIS Take 1 tablet (5 mg total) by mouth 2 (two) times daily.   ascorbic acid 500 MG tablet Commonly known as: VITAMIN C Take 500 mg by mouth daily.   dexlansoprazole 60 MG capsule Commonly known as: DEXILANT Take 1 capsule (60 mg total) by mouth daily. What changed: when to take this   loratadine 10 MG tablet Commonly known as: CLARITIN Take 10 mg by mouth daily.   losartan 25 MG tablet Commonly known as: COZAAR Take 1 tablet (25 mg total) by mouth daily.   metoprolol succinate 50 MG 24 hr tablet Commonly known as: TOPROL-XL Take 1 tablet (50 mg total) by mouth daily. Take with or immediately following a meal.   oxyCODONE 5 MG immediate release tablet Commonly known as: Oxy IR/ROXICODONE Take 1 tablet (5 mg total) by mouth every 6 (six) hours as needed.   tamsulosin 0.4 MG Caps capsule Commonly known as: FLOMAX Take 0.4 mg by mouth in the morning and at bedtime.   Vitamin D-3 125 MCG (5000 UT) Tabs Take 5,000 Units by mouth daily.             Follow-up Information        Celene Squibb, MD. Call.   Specialty: Internal Medicine Why: Follow up with your primary care physician in 1 week for blood work (BMP) per your cardiologist Contact information: [address] [place] Reichert [facility] [address] [phone]             Imogene Burn, PA-C Follow up on 08/18/2020.   Specialty: Cardiology Why: at 1015am  Contact information: [place] [address] [phone]              Rolm Bookbinder, MD. Call.   Specialty: General Surgery Why: We are working on your appointment, call to confirm. Please arrive 30 minutes prior to your appointment to check in and fill out paperwork. Bring photo ID and insurance information. Contact information: [address] [place] [address] [phone]             [facility] Follow up.   Specialty: Home Health Services Contact information: [address] [place] [address] [phone]                Signed: Obie Dredge, [facility] 07/29/2020, 4:08 PM

## 2020-08-27 ENCOUNTER — Telehealth (HOSPITAL_COMMUNITY): Payer: Self-pay

## 2020-08-27 ENCOUNTER — Telehealth: Payer: Self-pay | Admitting: *Deleted

## 2020-08-27 NOTE — Telephone Encounter (Signed)
Pharmacy Transitions of Care Follow-up Telephone Call  Date of discharge: 07/24/20 Discharge Diagnosis: Cardioversion for Atypical Afib  How have you been since you were released from the hospital? Patient has been well, received follow up visits with both hospital, PCP, and cardiology. Does have some dry skin in the last week but is staying hydrated. No issues with any bleeding/bruising  Medication changes made at discharge: yes  Medication changes obtained and verified? yes    Medication Accessibility:  Home Pharmacy: [facility]  Was the patient provided with refills on discharged medications? NO, patient has had new Rx's sent to home pharmacy and has refills  . Is the patient able to afford medications? yes    Medication Review:  APIXABEN (ELIQUIS)  Apixaban 10 mg BID initiated on 07/24/20 - Discussed importance of taking medication around the same time everyday  - Advised patient of medications to avoid (NSAIDs, ASA)  - Educated that Tylenol (acetaminophen) will be the preferred analgesic to prevent risk of bleeding  - Emphasized importance of monitoring for signs and symptoms of bleeding (abnormal bruising, prolonged bleeding, nose bleeds, bleeding from gums, discolored urine, black tarry stools)  - Advised patient to alert all providers of anticoagulation therapy prior to starting a new medication or having a procedure    Follow-up Appointments:  [facility] f/u appt confirmed? Yes - has followed up with PCP Dr. Wende Neighbors on 08/11/20 and Cardiology on 08/18/20 with Dr. Bonnell Public, has future follow up scheduled for 10/16/20   If their condition worsens, is the pt aware to call PCP or go to the Emergency Dept.? yes  Final Patient Assessment: Patient is doing well and has no issues with medications or refills. Is being followed by Cardiology and PCP.

## 2020-08-27 NOTE — Telephone Encounter (Signed)
Spoke with Tammy at [employer]. She was running Eliquis under the wrong insurance. Eliquis is covered under correct plan.

## 2020-08-27 NOTE — Telephone Encounter (Signed)
Continue eliquis  Zandra Abts MD

## 2020-08-27 NOTE — Telephone Encounter (Signed)
Pt given $10 voucher for Eliquis d/t PA not being approved. Denial states that pt can be switched to Xarelto and that med would be covered. Called to verify pt is able to use voucher. No answer left msg to call back.

## 2020-10-02 ENCOUNTER — Other Ambulatory Visit: Payer: Self-pay

## 2020-10-02 ENCOUNTER — Encounter: Payer: Self-pay | Admitting: Emergency Medicine

## 2020-10-02 ENCOUNTER — Ambulatory Visit
Admission: EM | Admit: 2020-10-02 | Discharge: 2020-10-02 | Disposition: A | Discharge status: Home / Self Care | Payer: PRIVATE HEALTH INSURANCE

## 2020-10-02 DIAGNOSIS — T8149XA Infection following a procedure, other surgical site, initial encounter: Secondary | ICD-10-CM | POA: Diagnosis not present

## 2020-10-02 HISTORY — DX: Gout, unspecified: M10.9

## 2020-10-02 MED ORDER — DOXYCYCLINE HYCLATE 100 MG PO CAPS: 100.0000 mg | ORAL_CAPSULE | Freq: Two times a day (BID) | ORAL | 0 refills | Status: DC

## 2020-10-02 NOTE — Discharge Instructions (Signed)
  Doxycycline was prescribed/take as directed Follow-up with PCP/GI for further evaluation Return or go to ED if you develop any new or worsening of his symptoms.Marland Kitchen

## 2020-10-02 NOTE — ED Triage Notes (Signed)
Lap band rupture on 12/7 and patient had a tear in stomach lining.  Patient was intubated and in ICU for 12 days.  Incision was left open to heal.  Had a wound vac.  2 weeks ago a red spot appeared on right side of stomach.  Red streaks on left side 2 days later that busted open.  Wound was packed and patient was placed on antibiotics.  On Monday ABD pain started again states he can't bend over now due to pain.  Patient called surgeon today and was advised to come to an urgent care.

## 2020-10-02 NOTE — ED Provider Notes (Signed)
[facility]   [id number] 10/02/20 Arrival Time: 2703   No chief complaint on file.  ms  SUBJECTIVE: History from: patient.  Patrick Chapman is a 60 y.o. male who presents to the urgent care with a complaint of increased abdominal pain that started this past Monday.  He developed the symptom after having lap band fracture on 07/14/2020.  Reported he was hospitalized and was discharged with a wound VAC.  He developed an abscess and was put on antibiotic.  Located pain to his abdomen.  He describes the pain as constant and achy.  He has tried OTC medications without relief.  Denies any alleviating factors.  Reports similar symptoms in the past.  ROS: per HPI all other are negative   Past Medical History:  Diagnosis Date  . GERD (gastroesophageal reflux disease)   . Gout   . Hypertension   . Obesity   . Sleep apnea    was tested, but had lap band surgery and with weight loss, no problems   Past Surgical History:  Procedure Laterality Date  . ANTERIOR CERVICAL DECOMP/DISCECTOMY FUSION N/A 08/15/2014   Procedure: ANTERIOR CERVICAL DECOMPRESSION/DISCECTOMY FUSION 1 LEVEL;  Surgeon: Consuella Lose, MD;  Location: [facility];  Service: Neurosurgery;  Laterality: N/A;  C56 anterior cervical fusion with interbody prosthesis plating and bonegraft C 5-6  . APPLICATION OF WOUND VAC N/A 07/14/2020   Procedure: APPLICATION OF WOUND VAC;  Surgeon: Rolm Bookbinder, MD;  Location: [facility];  Service: General;  Laterality: N/A;  . BIOPSY  10/10/2019   Procedure: BIOPSY;  Surgeon: Daneil Dolin, MD;  [facility];  Service: Endoscopy;;  . CARDIOVERSION N/A 07/23/2020   Procedure: CARDIOVERSION;  Surgeon: Thayer Headings, MD;  Location: [facility];  Service: Cardiovascular;  Laterality: N/A;  . CHOLECYSTECTOMY    . COLONOSCOPY WITH ESOPHAGOGASTRODUODENOSCOPY (EGD)  06/12/2012   Dr. Gala Romney: Status post prior lap band placement, otherwise normal study.  2 tubular adenomas removed  from the colon.  Next colonoscopy in 5 years.  . COLONOSCOPY WITH PROPOFOL N/A 10/10/2019   Procedure: COLONOSCOPY WITH PROPOFOL;  Surgeon: Daneil Dolin, MD;  [facility];  Service: Endoscopy;  Laterality: N/A;  11:00am  . LAPAROSCOPIC GASTRIC BANDING  2009   Dr. Hassell Done  . LAPAROTOMY N/A 07/14/2020   Procedure: EXPLORATORY LAPAROTOMY WITH REMOVAL OF LAP BAND AND COMPONENTS;  Surgeon: Rolm Bookbinder, MD;  Location: [facility];  Service: General;  Laterality: N/A;  . TEE WITHOUT CARDIOVERSION N/A 07/23/2020   Procedure: TRANSESOPHAGEAL ECHOCARDIOGRAM (TEE);  Surgeon: Acie Fredrickson Wonda Cheng, MD;  Location: [facility] ENDOSCOPY;  Service: Cardiovascular;  Laterality: N/A;   Allergies  Allergen Reactions  . Codeine Nausea And Vomiting and Rash   No current facility-administered medications on file prior to encounter.   Current Outpatient Medications on File Prior to Encounter  Medication Sig Dispense Refill  . colchicine 0.6 MG tablet Take 0.6 mg by mouth daily.    Marland Kitchen acetaminophen (TYLENOL) 500 MG tablet Take 1,000 mg by mouth every 6 (six) hours as needed for moderate pain or headache.    Marland Kitchen amiodarone (PACERONE) 200 MG tablet Take 1 tablet (200 mg total) by mouth daily for 21 days. 21 tablet 0  . apixaban (ELIQUIS) 5 MG TABS tablet Take 1 tablet (5 mg total) by mouth 2 (two) times daily. 60 tablet 11  . ascorbic acid (VITAMIN C) 500 MG tablet Take 500 mg by mouth daily.    . Cholecalciferol (VITAMIN D-3) 125  MCG (5000 UT) TABS Take 5,000 Units by mouth daily.    Marland Kitchen dexlansoprazole (DEXILANT) 60 MG capsule Take 1 capsule (60 mg total) by mouth daily. (Patient taking differently: Take 60 mg by mouth 2 (two) times daily.) 30 capsule 5  . loratadine (CLARITIN) 10 MG tablet Take 10 mg by mouth daily.    Marland Kitchen losartan (COZAAR) 25 MG tablet Take 1 tablet (25 mg total) by mouth daily. 30 tablet 11  . metoprolol succinate (TOPROL-XL) 50 MG 24 hr tablet Take 1 tablet (50 mg total) by mouth daily. Take with or  immediately following a meal. 90 tablet 3  . tamsulosin (FLOMAX) 0.4 MG CAPS capsule Take 0.4 mg by mouth in the morning and at bedtime.     . traMADol (ULTRAM) 50 MG tablet Take 50 mg by mouth every 6 (six) hours as needed.     Social History   Socioeconomic History  . Marital status: Married    Spouse name: Not on file  . Number of children: Not on file  . Years of education: Not on file  . Highest education level: Not on file  Occupational History  . Not on file  Tobacco Use  . Smoking status: Former Smoker    Packs/day: 1.00    Years: 10.00    Pack years: 10.00    Types: Cigarettes    Start date: 05/23/1977    Quit date: 05/24/1987    Years since quitting: 33.3  . Smokeless tobacco: Current User    Types: Snuff  Vaping Use  . Vaping Use: Never used  Substance and Sexual Activity  . Alcohol use: Not Currently    Alcohol/week: 0.0 standard drinks    Comment: occassional  . Drug use: No  . Sexual activity: Yes    Partners: Female    Birth control/protection: Surgical    Comment: spouse  Other Topics Concern  . Not on file  Social History Narrative  . Not on file   Social Determinants of Health   Financial Resource Strain: Not on file  Food Insecurity: Not on file  Transportation Needs: Not on file  Physical Activity: Not on file  Stress: Not on file  Social Connections: Not on file  Intimate Partner Violence: Not on file   Family History  Problem Relation Age of Onset  . Heart attack Mother        living  . Heart attack Father        living  . Diabetes Father   . Colon cancer Neg Hx     OBJECTIVE:  Vitals:   10/02/20 1648  BP: 125/81  Pulse: 77  Resp: 18  Temp: (!) 97.3 F (36.3 C)  TempSrc: Oral  SpO2: 96%     Physical Exam Vitals and nursing note reviewed.  Constitutional:      General: He is not in acute distress.    Appearance: Normal appearance. He is normal weight. He is not ill-appearing, toxic-appearing or diaphoretic.   Cardiovascular:     Rate and Rhythm: Normal rate and regular rhythm.     Pulses: Normal pulses.     Heart sounds: Normal heart sounds. No murmur heard. No friction rub. No gallop.   Pulmonary:     Effort: Pulmonary effort is normal. No respiratory distress.     Breath sounds: Normal breath sounds. No stridor. No wheezing, rhonchi or rales.  Chest:     Chest wall: No tenderness.  Abdominal:     General: Abdomen is flat.  Palpations: Abdomen is soft.     Tenderness: There is generalized abdominal tenderness.     Comments: Vertical incision on abdomen present, healing with no drainage at this time  Neurological:     Mental Status: He is alert and oriented to person, place, and time.     LABS:  No results found for this or any previous visit (from the past 24 hour(s)).   ASSESSMENT & PLAN:  1. Infected surgical wound     Meds ordered this encounter  Medications  . doxycycline (VIBRAMYCIN) 100 MG capsule    Sig: Take 1 capsule (100 mg total) by mouth 2 (two) times daily.    Dispense:  20 capsule    Refill:  0    Discharge Instructions  Doxycycline was prescribed/take as directed Follow-up with PCP/GI for further evaluation Return or go to ED if you develop any new or worsening of his symptoms..   Reviewed expectations re: course of current medical issues. Questions answered. Outlined signs and symptoms indicating need for more acute intervention. Patient verbalized understanding. After Visit Summary given.         Emerson Monte, [place] 10/02/20 [phone]

## 2020-10-06 ENCOUNTER — Ambulatory Visit (HOSPITAL_COMMUNITY)
Admission: RE | Admit: 2020-10-06 | Discharge: 2020-10-06 | Disposition: A | Discharge status: Home / Self Care | Payer: PRIVATE HEALTH INSURANCE | Source: Ambulatory Visit | Attending: Cardiology | Admitting: Cardiology

## 2020-10-06 ENCOUNTER — Other Ambulatory Visit: Payer: Self-pay

## 2020-10-06 DIAGNOSIS — I5042 Chronic combined systolic (congestive) and diastolic (congestive) heart failure: Secondary | ICD-10-CM | POA: Insufficient documentation

## 2020-10-06 LAB — ECHOCARDIOGRAM COMPLETE
AR max vel: 2.21 cm2
AV Area VTI: 2.51 cm2
AV Area mean vel: 2.32 cm2
AV Mean grad: 3.4 mmHg
AV Peak grad: 7.1 mmHg
Ao pk vel: 1.33 m/s
Area-P 1/2: 4.65 cm2
S' Lateral: 3 cm

## 2020-10-06 NOTE — Progress Notes (Signed)
*  PRELIMINARY RESULTS* Echocardiogram 2D Echocardiogram has been performed.  Patrick Chapman 10/06/2020, 4:11 PM

## 2020-10-07 ENCOUNTER — Telehealth: Payer: Self-pay

## 2020-10-07 NOTE — Telephone Encounter (Signed)
-----   Message from Imogene Burn, PA-C sent at 10/07/2020  7:54 AM EST ----- Heart function back to normal!!! Echo looks great. No changes. thanks

## 2020-10-07 NOTE — Telephone Encounter (Signed)
Patient contacted and verbalized understanding.

## 2020-10-15 ENCOUNTER — Other Ambulatory Visit: Payer: Self-pay | Admitting: Student

## 2020-10-15 ENCOUNTER — Other Ambulatory Visit (HOSPITAL_COMMUNITY): Payer: Self-pay | Admitting: Student

## 2020-10-15 DIAGNOSIS — L24A9 Irritant contact dermatitis due friction or contact with other specified body fluids: Secondary | ICD-10-CM

## 2020-10-16 ENCOUNTER — Encounter: Payer: Self-pay | Admitting: Cardiology

## 2020-10-16 ENCOUNTER — Telehealth (INDEPENDENT_AMBULATORY_CARE_PROVIDER_SITE_OTHER): Payer: PRIVATE HEALTH INSURANCE | Admitting: Cardiology

## 2020-10-16 ENCOUNTER — Other Ambulatory Visit: Payer: Self-pay

## 2020-10-16 VITALS — BP 116/70 | HR 66 | Ht 72.0 in | Wt 308.0 lb

## 2020-10-16 DIAGNOSIS — I519 Heart disease, unspecified: Secondary | ICD-10-CM | POA: Diagnosis not present

## 2020-10-16 DIAGNOSIS — I484 Atypical atrial flutter: Secondary | ICD-10-CM

## 2020-10-16 NOTE — Progress Notes (Signed)
Virtual Visit via Telephone Note   This visit type was conducted due to national recommendations for restrictions regarding the COVID-19 Pandemic (e.g. social distancing) in an effort to limit this patient's exposure and mitigate transmission in our community.  Due to his co-morbid illnesses, this patient is at least at moderate risk for complications without adequate follow up.  This format is felt to be most appropriate for this patient at this time.  The patient did not have access to video technology/had technical difficulties with video requiring transitioning to audio format only (telephone).  All issues noted in this document were discussed and addressed.  No physical exam could be performed with this format.  Please refer to the patient's chart for his  consent to telehealth for [facility].    Date:  10/16/2020   ID:  Patrick Chapman, DOB July 03, 1961, MRN [id number] The patient was identified using 2 identifiers.  Patient Location: Home Provider Location: Home Office   PCP:  Celene Squibb, [place]  Cardiologist:  Carlyle Dolly, MD  Advanced Practice Provider:  No care team member to display Electrophysiologist:  None  [id number]}   Evaluation Performed:  Follow-Up Visit  Chief Complaint:  FOllow up  History of Present Illness:    Patrick Chapman is a 60 y.o. male seen today for follow up of the following medical problems.   1. Aflutter - new diagnosis during admission with sepsis/bowel perforation in 07/2020 -  status post TEE/DCCV 07/23/2020. Plan was for short course of amiodarone  -he has history of chronic mild palpitations for several years which are ongoing. No severe symptoms, heart rate checks at home have been normal - remains on eliquis at this time.    2. Systolic dysfunction - 62/2/63 echo LVEF 60-65% - 07/23/20 TEE LVEF 55-60% by offical report, the prelim report mentioned 35-40%. This was in the setting of  uncontrolled aflutter - 10/2020 TTE LVEF 60-65%, no WMAs, normal RV function - no recent symptoms       The patient does not have symptoms concerning for COVID-19 infection (fever, chills, cough, or new shortness of breath).    Past Medical History:  Diagnosis Date  . Atypical atrial flutter ([place])   . GERD (gastroesophageal reflux disease)   . Gout   . Hypertension   . Obesity   . Sleep apnea    was tested, but had lap band surgery and with weight loss, no problems   Past Surgical History:  Procedure Laterality Date  . ANTERIOR CERVICAL DECOMP/DISCECTOMY FUSION N/A 08/15/2014   Procedure: ANTERIOR CERVICAL DECOMPRESSION/DISCECTOMY FUSION 1 LEVEL;  Surgeon: Consuella Lose, MD;  Location: [place] [facility];  Service: Neurosurgery;  Laterality: N/A;  C56 anterior cervical fusion with interbody prosthesis plating and bonegraft C 5-6  . APPLICATION OF WOUND VAC N/A 07/14/2020   Procedure: APPLICATION OF WOUND VAC;  Surgeon: Rolm Bookbinder, MD;  Location: [place];  Service: General;  Laterality: N/A;  . BIOPSY  10/10/2019   Procedure: BIOPSY;  Surgeon: Daneil Dolin, MD;  [facility];  Service: Endoscopy;;  . CARDIOVERSION N/A 07/23/2020   Procedure: CARDIOVERSION;  Surgeon: Thayer Headings, MD;  Location: [place];  Service: Cardiovascular;  Laterality: N/A;  . CHOLECYSTECTOMY    . COLONOSCOPY WITH ESOPHAGOGASTRODUODENOSCOPY (EGD)  06/12/2012   Dr. Gala Romney: Status post prior lap band placement, otherwise normal study.  2 tubular adenomas removed from the colon.  Next colonoscopy in 5 years.  Marland Kitchen  COLONOSCOPY WITH PROPOFOL N/A 10/10/2019   Procedure: COLONOSCOPY WITH PROPOFOL;  Surgeon: Daneil Dolin, MD;  [facility];  Service: Endoscopy;  Laterality: N/A;  11:00am  . LAPAROSCOPIC GASTRIC BANDING  2009   Dr. Hassell Done  . LAPAROTOMY N/A 07/14/2020   Procedure: EXPLORATORY LAPAROTOMY WITH REMOVAL OF LAP BAND AND COMPONENTS;  Surgeon: Rolm Bookbinder, MD;   Location: [place];  Service: General;  Laterality: N/A;  . TEE WITHOUT CARDIOVERSION N/A 07/23/2020   Procedure: TRANSESOPHAGEAL ECHOCARDIOGRAM (TEE);  Surgeon: Thayer Headings, MD;  Location: [facility] ENDOSCOPY;  Service: Cardiovascular;  Laterality: N/A;     Current Meds  Medication Sig  . acetaminophen (TYLENOL) 500 MG tablet Take 1,000 mg by mouth every 6 (six) hours as needed for moderate pain or headache.  Marland Kitchen apixaban (ELIQUIS) 5 MG TABS tablet Take 1 tablet (5 mg total) by mouth 2 (two) times daily.  Marland Kitchen ascorbic acid (VITAMIN C) 500 MG tablet Take 500 mg by mouth daily.  . colchicine 0.6 MG tablet Take 0.6 mg by mouth daily.  Marland Kitchen dexlansoprazole (DEXILANT) 60 MG capsule Take 1 capsule (60 mg total) by mouth daily. (Patient taking differently: Take 60 mg by mouth 2 (two) times daily.)  . loratadine (CLARITIN) 10 MG tablet Take 10 mg by mouth daily.  Marland Kitchen losartan (COZAAR) 25 MG tablet Take 1 tablet (25 mg total) by mouth daily.  . metoprolol succinate (TOPROL-XL) 50 MG 24 hr tablet Take 1 tablet (50 mg total) by mouth daily. Take with or immediately following a meal.  . tamsulosin (FLOMAX) 0.4 MG CAPS capsule Take 0.4 mg by mouth in the morning and at bedtime.      Allergies:   Codeine   Social History   Tobacco Use  . Smoking status: Former Smoker    Packs/day: 1.00    Years: 10.00    Pack years: 10.00    Types: Cigarettes    Start date: 05/23/1977    Quit date: 05/24/1987    Years since quitting: 33.4  . Smokeless tobacco: Current User    Types: Snuff  Vaping Use  . Vaping Use: Never used  Substance Use Topics  . Alcohol use: Not Currently    Alcohol/week: 0.0 standard drinks    Comment: occassional  . Drug use: No     Family Hx: The patient's family history includes Diabetes in his father; Heart attack in his father and mother. There is no history of Colon cancer.  ROS:   Please see the history of present illness.     All other systems reviewed and are negative.   Prior  CV studies:   The following studies were reviewed today:    Labs/Other Tests and Data Reviewed:    EKG:  No ECG reviewed.  Recent Labs: 07/16/2020: ALT 33 07/17/2020: B Natriuretic Peptide 112.2; TSH 11.164 07/19/2020: Magnesium 2.0 07/24/2020: BUN 11; Creatinine, Ser 1.11; Hemoglobin 10.6; Platelets 330; Potassium 3.8; Sodium 138   Recent Lipid Panel Lab Results  Component Value Date/Time   CHOL 207 (H) 04/26/2018 10:13 AM   TRIG 227 (H) 07/15/2020 05:10 AM   HDL 32 (L) 04/26/2018 10:13 AM   CHOLHDL 6.5 04/26/2018 10:13 AM   LDLCALC 139 (H) 04/26/2018 10:13 AM    Wt Readings from Last 3 Encounters:  10/16/20 (!) 308 lb (139.7 kg)  08/18/20 284 lb 12.8 oz (129.2 kg)  07/14/20 290 lb (131.5 kg)     Risk Assessment/Calculations:      Objective:  Vital Signs:  BP 116/70   Pulse 66   Ht 6' (1.829 m)   Wt (!) 308 lb (139.7 kg)   BMI 41.77 kg/m   Normal affect. NOrmal speech pattern and tone. Comfortable, no apparent distress. No audible signs of sob or wheezing.   ASSESSMENT & PLAN:    1. Aflutter - new diagnosis during admission in 07/2020 with sepsis, abdominal perforation - s/p TEE/DCCV, transient course of amiodarone. Remains on eliquis - no significant symptoms - appears this was transient in the setting of severe systemic stress - come for EKG nursing visit, if remains in SR will d/c eliquis.    2. Systolic dysfunction - transient in setting of uncontrolled aflutter, sepsis.  - repeat echo shows normal function - no further intervention indicated.         COVID-19 Education: The signs and symptoms of COVID-19 were discussed with the patient and how to seek care for testing (follow up with PCP or arrange E-visit).  The importance of social distancing was discussed today.  Time:   Today, I have spent 15 minutes with the patient with telehealth technology discussing the above problems.     Medication Adjustments/Labs and Tests Ordered: Current  medicines are reviewed at length with the patient today.  Concerns regarding medicines are outlined above.   Tests Ordered: No orders of the defined types were placed in this encounter.   Medication Changes: No orders of the defined types were placed in this encounter.   Follow Up:  In Person in 6 month(s)  Signed, Carlyle Dolly, MD  10/16/2020 9:51 AM    [place]

## 2020-10-16 NOTE — Patient Instructions (Signed)
Medication Instructions:  Your physician recommends that you continue on your current medications as directed. Please refer to the Current Medication list given to you today.  *If you need a refill on your cardiac medications before your next appointment, please call your pharmacy*   Lab Work: NONE   If you have labs (blood work) drawn today and your tests are completely normal, you will receive your results only by: Marland Kitchen MyChart Message (if you have MyChart) OR . A paper copy in the mail If you have any lab test that is abnormal or we need to change your treatment, we will call you to review the results.   Testing/Procedures: NONE    Follow-Up: At [facility], you and your health needs are our priority.  As part of our continuing mission to provide you with exceptional heart care, we have created designated Provider Care Teams.  These Care Teams include your primary Cardiologist (physician) and Advanced Practice Providers (APPs -  Physician Assistants and Nurse Practitioners) who all work together to provide you with the care you need, when you need it.  We recommend signing up for the patient portal called "MyChart".  Sign up information is provided on this After Visit Summary.  MyChart is used to connect with patients for Virtual Visits (Telemedicine).  Patients are able to view lab/test results, encounter notes, upcoming appointments, etc.  Non-urgent messages can be sent to your provider as well.   To learn more about what you can do with MyChart, go to [email or web address].    Your next appointment:   6 month(s)  The format for your next appointment:   In Person  Provider:   Carlyle Dolly, MD   Other Instructions Your physician recommends that you schedule a follow-up appointment in: Next week for a nursing visit.   Thank you for choosing [facility]!

## 2020-10-20 ENCOUNTER — Ambulatory Visit (INDEPENDENT_AMBULATORY_CARE_PROVIDER_SITE_OTHER): Payer: PRIVATE HEALTH INSURANCE

## 2020-10-20 ENCOUNTER — Other Ambulatory Visit: Payer: Self-pay

## 2020-10-20 VITALS — BP 132/84 | HR 86 | Ht 72.0 in | Wt 308.0 lb

## 2020-10-20 DIAGNOSIS — I484 Atypical atrial flutter: Secondary | ICD-10-CM | POA: Diagnosis not present

## 2020-10-20 NOTE — Patient Instructions (Signed)
Your EKG today shows your are in normal sinus rhythm so you can STOP Eliquis.   Keep follow up appointment in 6 months.     Thank you for choosing [facility] !

## 2020-10-21 ENCOUNTER — Ambulatory Visit (HOSPITAL_BASED_OUTPATIENT_CLINIC_OR_DEPARTMENT_OTHER)
Admission: RE | Admit: 2020-10-21 | Discharge: 2020-10-21 | Disposition: A | Discharge status: Home / Self Care | Payer: PRIVATE HEALTH INSURANCE | Source: Ambulatory Visit | Attending: Student | Admitting: Student

## 2020-10-21 ENCOUNTER — Encounter (HOSPITAL_BASED_OUTPATIENT_CLINIC_OR_DEPARTMENT_OTHER): Payer: Self-pay

## 2020-10-21 DIAGNOSIS — K9581 Infection due to other bariatric procedure: Secondary | ICD-10-CM | POA: Diagnosis present

## 2020-10-21 DIAGNOSIS — L24A9 Irritant contact dermatitis due friction or contact with other specified body fluids: Secondary | ICD-10-CM

## 2020-10-21 DIAGNOSIS — Z09 Encounter for follow-up examination after completed treatment for conditions other than malignant neoplasm: Secondary | ICD-10-CM | POA: Insufficient documentation

## 2020-10-21 DIAGNOSIS — K838 Other specified diseases of biliary tract: Secondary | ICD-10-CM | POA: Diagnosis not present

## 2020-10-21 LAB — POCT I-STAT CREATININE: Creatinine, Ser: 1.3 mg/dL — ABNORMAL HIGH (ref 0.61–1.24)

## 2020-10-21 MED ORDER — IOHEXOL 300 MG/ML  SOLN
100.0000 mL | Freq: Once | INTRAMUSCULAR | Status: AC | PRN
  Administered 2020-10-21: 100 mL via INTRAVENOUS
  Filled 2020-10-21: qty 100

## 2021-02-03 ENCOUNTER — Other Ambulatory Visit: Payer: Self-pay | Admitting: General Surgery

## 2021-02-03 DIAGNOSIS — Z9889 Other specified postprocedural states: Secondary | ICD-10-CM

## 2021-04-29 ENCOUNTER — Other Ambulatory Visit: Payer: Self-pay

## 2021-04-29 ENCOUNTER — Ambulatory Visit (INDEPENDENT_AMBULATORY_CARE_PROVIDER_SITE_OTHER): Payer: No Typology Code available for payment source | Admitting: Cardiology

## 2021-04-29 ENCOUNTER — Encounter: Payer: Self-pay | Admitting: Cardiology

## 2021-04-29 VITALS — BP 138/84 | HR 90 | Ht 72.0 in | Wt 343.0 lb

## 2021-04-29 DIAGNOSIS — I519 Heart disease, unspecified: Secondary | ICD-10-CM | POA: Diagnosis not present

## 2021-04-29 DIAGNOSIS — I10 Essential (primary) hypertension: Secondary | ICD-10-CM

## 2021-04-29 DIAGNOSIS — I4892 Unspecified atrial flutter: Secondary | ICD-10-CM | POA: Diagnosis not present

## 2021-04-29 NOTE — Progress Notes (Signed)
Clinical Summary Patrick Chapman is a 60 y.o.male seen today for follow up of the following medical problems.     1. Aflutter - new diagnosis during admission with sepsis/bowel perforation in 07/2020 -  status post TEE/DCCV 07/23/2020. Plan was for short course of amiodarone   -he has history of chronic mild palpitations for several years which are ongoing. No severe symptoms, heart rate checks at home have been normal - remains on eliquis at this time.   - arrythmia occurred in setting of severe systemic illness, in absence of clinical recurrence and low CHADS2Vasc score of 1 we stopped eliquis   - no recent palpitations -    2. Systolic dysfunction - 58/8/50 echo LVEF 60-65% - 07/23/20 TEE LVEF 55-60% by offical report, the prelim report mentioned 35-40%. This was in the setting of uncontrolled aflutter - 10/2020 TTE LVEF 60-65%, no WMAs, normal RV function  - no recent SOB/DOE. Chronic LLE edema unchanged         Past Medical History:  Diagnosis Date   Atypical atrial flutter ([facility])    GERD (gastroesophageal reflux disease)    Gout    Hypertension    Obesity    Sleep apnea    was tested, but had lap band surgery and with weight loss, no problems     Allergies  Allergen Reactions   Codeine Nausea And Vomiting and Rash     Current Outpatient Medications  Medication Sig Dispense Refill   acetaminophen (TYLENOL) 500 MG tablet Take 1,000 mg by mouth every 6 (six) hours as needed for moderate pain or headache.     ascorbic acid (VITAMIN C) 500 MG tablet Take 500 mg by mouth daily.     colchicine 0.6 MG tablet Take 0.6 mg by mouth daily.     dexlansoprazole (DEXILANT) 60 MG capsule Take 1 capsule (60 mg total) by mouth daily. (Patient taking differently: Take 60 mg by mouth 2 (two) times daily.) 30 capsule 5   loratadine (CLARITIN) 10 MG tablet Take 10 mg by mouth daily.     losartan (COZAAR) 25 MG tablet Take 1 tablet (25 mg total) by mouth daily. 30 tablet 11    metoprolol succinate (TOPROL-XL) 50 MG 24 hr tablet Take 1 tablet (50 mg total) by mouth daily. Take with or immediately following a meal. 90 tablet 3   tamsulosin (FLOMAX) 0.4 MG CAPS capsule Take 0.4 mg by mouth in the morning and at bedtime.      No current facility-administered medications for this visit.     Past Surgical History:  Procedure Laterality Date   ANTERIOR CERVICAL DECOMP/DISCECTOMY FUSION N/A 08/15/2014   Procedure: ANTERIOR CERVICAL DECOMPRESSION/DISCECTOMY FUSION 1 LEVEL;  Surgeon: Consuella Lose, MD;  Location: [place] [facility];  Service: Neurosurgery;  Laterality: N/A;  C56 anterior cervical fusion with interbody prosthesis plating and bonegraft C 5-6   APPLICATION OF WOUND VAC N/A 07/14/2020   Procedure: APPLICATION OF WOUND VAC;  Surgeon: Rolm Bookbinder, MD;  Location: [place];  Service: General;  Laterality: N/A;   BIOPSY  10/10/2019   Procedure: BIOPSY;  Surgeon: Daneil Dolin, MD;  [facility];  Service: Endoscopy;;   CARDIOVERSION N/A 07/23/2020   Procedure: CARDIOVERSION;  Surgeon: Thayer Headings, MD;  Location: [facility] ENDOSCOPY;  Service: Cardiovascular;  Laterality: N/A;   CHOLECYSTECTOMY     COLONOSCOPY WITH ESOPHAGOGASTRODUODENOSCOPY (EGD)  06/12/2012   Dr. Gala Romney: Status post prior lap band placement, otherwise normal study.  2 tubular  adenomas removed from the colon.  Next colonoscopy in 5 years.   COLONOSCOPY WITH PROPOFOL N/A 10/10/2019   Procedure: COLONOSCOPY WITH PROPOFOL;  Surgeon: Daneil Dolin, MD;  [facility];  Service: Endoscopy;  Laterality: N/A;  11:00am   LAPAROSCOPIC GASTRIC BANDING  2009   Dr. Hassell Done   LAPAROTOMY N/A 07/14/2020   Procedure: EXPLORATORY LAPAROTOMY WITH REMOVAL OF LAP BAND AND COMPONENTS;  Surgeon: Rolm Bookbinder, MD;  Location: [place];  Service: General;  Laterality: N/A;   TEE WITHOUT CARDIOVERSION N/A 07/23/2020   Procedure: TRANSESOPHAGEAL ECHOCARDIOGRAM (TEE);  Surgeon: Acie Fredrickson Wonda Cheng, MD;   Location: [facility] ENDOSCOPY;  Service: Cardiovascular;  Laterality: N/A;     Allergies  Allergen Reactions   Codeine Nausea And Vomiting and Rash      Family History  Problem Relation Age of Onset   Heart attack Mother        living   Heart attack Father        living   Diabetes Father    Colon cancer Neg Hx      Social History Patrick Chapman reports that he quit smoking about 33 years ago. His smoking use included cigarettes. He started smoking about 43 years ago. He has a 10.00 pack-year smoking history. His smokeless tobacco use includes snuff. Patrick Chapman reports that he does not currently use alcohol.   Review of Systems CONSTITUTIONAL: No weight loss, fever, chills, weakness or fatigue.  HEENT: Eyes: No visual loss, blurred vision, double vision or yellow sclerae.No hearing loss, sneezing, congestion, runny nose or sore throat.  SKIN: No rash or itching.  CARDIOVASCULAR: per hpi RESPIRATORY: No shortness of breath, cough or sputum.  GASTROINTESTINAL: No anorexia, nausea, vomiting or diarrhea. No abdominal pain or blood.  GENITOURINARY: No burning on urination, no polyuria NEUROLOGICAL: No headache, dizziness, syncope, paralysis, ataxia, numbness or tingling in the extremities. No change in bowel or bladder control.  MUSCULOSKELETAL: No muscle, back pain, joint pain or stiffness.  LYMPHATICS: No enlarged nodes. No history of splenectomy.  PSYCHIATRIC: No history of depression or anxiety.  ENDOCRINOLOGIC: No reports of sweating, cold or heat intolerance. No polyuria or polydipsia.  Marland Kitchen   Physical Examination Today's Vitals   04/29/21 1412  BP: 138/84  Pulse: 90  SpO2: 95%  Weight: (!) 343 lb (155.6 kg)  Height: 6' (1.829 m)   Body mass index is 46.52 kg/m.  Gen: resting comfortably, no acute distress HEENT: no scleral icterus, pupils equal round and reactive, no palptable cervical adenopathy,  CV: RRR, no m/rg, no jvd Resp: Clear to auscultation bilaterally GI:  abdomen is soft, non-tender, non-distended, normal bowel sounds, no hepatosplenomegaly MSK: extremities are warm, no edema.  Skin: warm, no rash Neuro:  no focal deficits Psych: appropriate affect   Diagnostic Studies     Assessment and Plan  1. Aflutter - new diagnosis during admission in 07/2020 with sepsis, abdominal perforation - s/p TEE/DCCV, transient course of amiodarone.  - appears this was transient in the setting of severe systemic stress, has not had signs of recurrence. Without recurrence and CHADS2Vasc score of 1 he is off eliquls EKG today shows NSR - continue to monitor     2. Systolic dysfunction - transient in setting of uncontrolled aflutter, sepsis.  - repeat echo shows normal function - no new symptmos - continue to monitor.    3. HTN - elevated today, significant weight gain over the last few months - he will update [place] on home bp's in  2 weeks, if consistent elevated trend would increase losartan to 50mg  daily.    Arnoldo Lenis, M.D.

## 2021-04-29 NOTE — Patient Instructions (Signed)
Medication Instructions:  Your physician recommends that you continue on your current medications as directed. Please refer to the Current Medication list given to you today.  *If you need a refill on your cardiac medications before your next appointment, please call your pharmacy*   Lab Work: None today  If you have labs (blood work) drawn today and your tests are completely normal, you will receive your results only by: [place] (if you have MyChart) OR A paper copy in the mail If you have any lab test that is abnormal or we need to change your treatment, we will call you to review the results.   Testing/Procedures: None today    Follow-Up: At [facility], you and your health needs are our priority.  As part of our continuing mission to provide you with exceptional heart care, we have created designated Provider Care Teams.  These Care Teams include your primary Cardiologist (physician) and Advanced Practice Providers (APPs -  Physician Assistants and Nurse Practitioners) who all work together to provide you with the care you need, when you need it.  We recommend signing up for the patient portal called "MyChart".  Sign up information is provided on this After Visit Summary.  MyChart is used to connect with patients for Virtual Visits (Telemedicine).  Patients are able to view lab/test results, encounter notes, upcoming appointments, etc.  Non-urgent messages can be sent to your provider as well.   To learn more about what you can do with MyChart, go to [email or web address].    Your next appointment:   6 month(s)  The format for your next appointment:   In Person  Provider:   Carlyle Dolly, MD   Other Instructions  Keep daily BP log for 2 weeks, fax/return to office. Take BP 2 hours after you have taken your medications.

## 2021-06-08 ENCOUNTER — Other Ambulatory Visit: Payer: Self-pay

## 2021-06-08 ENCOUNTER — Other Ambulatory Visit: Payer: Self-pay | Admitting: Internal Medicine

## 2021-06-08 ENCOUNTER — Ambulatory Visit (HOSPITAL_COMMUNITY)
Admission: RE | Admit: 2021-06-08 | Discharge: 2021-06-08 | Disposition: A | Discharge status: Home / Self Care | Payer: No Typology Code available for payment source | Source: Ambulatory Visit | Attending: Internal Medicine | Admitting: Internal Medicine

## 2021-06-08 DIAGNOSIS — I809 Phlebitis and thrombophlebitis of unspecified site: Secondary | ICD-10-CM

## 2021-06-23 ENCOUNTER — Telehealth: Payer: Self-pay | Admitting: Cardiology

## 2021-06-23 MED ORDER — LOSARTAN POTASSIUM 50 MG PO TABS: 50.0000 mg | ORAL_TABLET | Freq: Every day | ORAL | 3 refills | Status: DC

## 2021-06-23 NOTE — Telephone Encounter (Signed)
Pt c/o BP issue: STAT if pt c/o blurred vision, one-sided weakness or slurred speech  1. What are your last 5 BP readings?  146/83 p87  2. Are you having any other symptoms (ex. Dizziness, headache, blurred vision, passed out)? NO TO ALL  3. What is your BP issue? PT WAS ADVISED BY [facility] DURING HIS APPT 04/29/21 TO ADJUST HIS MEDS, PT DID NOT WANT THE MEDS ADJUSTED BECAUSE HE WANTED TO TRY TO LOOSE SOME WEIGHT. PT WANTS TO SPEAK WITH SOMEONE ABOUT ADJUSTING HIS  BP MEDS

## 2021-06-23 NOTE — Telephone Encounter (Signed)
Can incraese losartan to 50mg  daily, update [place] on bp's in about 2 weeks, checking 3 times a week would be fine   Zandra Abts MD

## 2021-06-23 NOTE — Telephone Encounter (Signed)
Pt states that his bp has been running higher: 146/78 hr-90 146/77 hr- 80 153/82 hr-76 146/83 hr- 87   Pt stated that Dr. Harl Bowie had mentioned that he could increase his Losartan to 50 mg QD.  Per last office note:  - he will update [place] on home bp's in 2 weeks, if consistent elevated trend would increase losartan to 50mg  daily.   Please advise.

## 2021-06-23 NOTE — Telephone Encounter (Signed)
Pt verbalized understanding.

## 2021-08-09 ENCOUNTER — Telehealth: Payer: Managed Care, Other (non HMO) | Admitting: Physician Assistant

## 2021-08-09 DIAGNOSIS — B9689 Other specified bacterial agents as the cause of diseases classified elsewhere: Secondary | ICD-10-CM | POA: Diagnosis not present

## 2021-08-09 DIAGNOSIS — J208 Acute bronchitis due to other specified organisms: Secondary | ICD-10-CM | POA: Diagnosis not present

## 2021-08-09 MED ORDER — AZITHROMYCIN 250 MG PO TABS: ORAL_TABLET | ORAL | 0 refills | Status: AC

## 2021-08-09 MED ORDER — ALBUTEROL SULFATE HFA 108 (90 BASE) MCG/ACT IN AERS
2.0000 | INHALATION_SPRAY | Freq: Four times a day (QID) | RESPIRATORY_TRACT | 0 refills | Status: DC | PRN
Start: 2021-08-09 — End: 2023-02-22

## 2021-08-09 MED ORDER — PREDNISONE 10 MG (21) PO TBPK: ORAL_TABLET | ORAL | 0 refills | Status: DC

## 2021-08-09 NOTE — Patient Instructions (Signed)
Patrick Chapman, thank you for joining Mar Daring, PA-C for today's virtual visit.  While this provider is not your primary care provider (PCP), if your PCP is located in our provider database this encounter information will be shared with them immediately following your visit.  Consent: (Patient) Patrick Chapman provided verbal consent for this virtual visit at the beginning of the encounter.  Current Medications:  Current Outpatient Medications:    albuterol (VENTOLIN HFA) 108 (90 Base) MCG/ACT inhaler, Inhale 2 puffs into the lungs every 6 (six) hours as needed for wheezing or shortness of breath., Disp: 8 g, Rfl: 0   azithromycin (ZITHROMAX) 250 MG tablet, Take 2 tablets on day 1, then 1 tablet daily on days 2 through 5, Disp: 6 tablet, Rfl: 0   predniSONE (STERAPRED UNI-PAK 21 TAB) 10 MG (21) TBPK tablet, 6 day taper; take as directed on package instructions, Disp: 21 tablet, Rfl: 0   acetaminophen (TYLENOL) 500 MG tablet, Take 1,000 mg by mouth every 6 (six) hours as needed for moderate pain or headache., Disp: , Rfl:    ascorbic acid (VITAMIN C) 500 MG tablet, Take 500 mg by mouth daily., Disp: , Rfl:    colchicine 0.6 MG tablet, Take 0.6 mg by mouth daily., Disp: , Rfl:    dexlansoprazole (DEXILANT) 60 MG capsule, Take 1 capsule (60 mg total) by mouth daily. (Patient taking differently: Take 60 mg by mouth 2 (two) times daily.), Disp: 30 capsule, Rfl: 5   loratadine (CLARITIN) 10 MG tablet, Take 10 mg by mouth daily., Disp: , Rfl:    losartan (COZAAR) 50 MG tablet, Take 1 tablet (50 mg total) by mouth daily., Disp: 90 tablet, Rfl: 3   metoprolol succinate (TOPROL-XL) 50 MG 24 hr tablet, Take 1 tablet (50 mg total) by mouth daily. Take with or immediately following a meal., Disp: 90 tablet, Rfl: 3   tamsulosin (FLOMAX) 0.4 MG CAPS capsule, Take 0.4 mg by mouth in the morning and at bedtime. , Disp: , Rfl:    Medications ordered in this encounter:  Meds ordered this encounter   Medications   azithromycin (ZITHROMAX) 250 MG tablet    Sig: Take 2 tablets on day 1, then 1 tablet daily on days 2 through 5    Dispense:  6 tablet    Refill:  0    Order Specific Question:   Supervising Provider    Answer:   Sabra Heck, BRIAN [[id number]]   predniSONE (STERAPRED UNI-PAK 21 TAB) 10 MG (21) TBPK tablet    Sig: 6 day taper; take as directed on package instructions    Dispense:  21 tablet    Refill:  0    Order Specific Question:   Supervising Provider    Answer:   Sabra Heck, BRIAN [[id number]]   albuterol (VENTOLIN HFA) 108 (90 Base) MCG/ACT inhaler    Sig: Inhale 2 puffs into the lungs every 6 (six) hours as needed for wheezing or shortness of breath.    Dispense:  8 g    Refill:  0    Order Specific Question:   Supervising Provider    Answer:   Sabra Heck, [place]     *If you need refills on other medications prior to your next appointment, please contact your pharmacy*  Follow-Up: Call back or seek an in-person evaluation if the symptoms worsen or if the condition fails to improve as anticipated.  Other Instructions Acute Bronchitis, Adult Acute bronchitis is sudden inflammation of the main airways (bronchi)  that come off the windpipe (trachea) in the lungs. The swelling causes the airways to get smaller and make more mucus than normal. This can make it hard to breathe and can cause coughing or noisy breathing (wheezing). Acute bronchitis may last several weeks. The cough may last longer. Allergies, asthma, and exposure to smoke may make the condition worse. What are the causes? This condition can be caused by germs and by substances that irritate the lungs, including: Cold and flu viruses. The most common cause of this condition is the virus that causes the common cold. Bacteria. This is less common. Breathing in substances that irritate the lungs, including: Smoke from cigarettes and other forms of tobacco. Dust and pollen. Fumes from household cleaning products, gases, or  burned fuel. Indoor or outdoor air pollution. What increases the risk? The following factors may make you more likely to develop this condition: A weak body's defense system, also called the immune system. A condition that affects your lungs and breathing, such as asthma. What are the signs or symptoms? Common symptoms of this condition include: Coughing. This may bring up clear, yellow, or green mucus from your lungs (sputum). Wheezing. Runny or stuffy nose. Having too much mucus in your lungs (chest congestion). Shortness of breath. Aches and pains, including sore throat or chest. How is this diagnosed? This condition is usually diagnosed based on: Your symptoms and medical history. A physical exam. You may also have other tests, including tests to rule out other conditions, such as pneumonia. These tests include: A test of lung function. Test of a mucus sample to look for the presence of bacteria. Tests to check the oxygen level in your blood. Blood tests. Chest X-ray. How is this treated? Most cases of acute bronchitis clear up over time without treatment. Your health care provider may recommend: Drinking more fluids to help thin your mucus so it is easier to cough up. Taking inhaled medicine (inhaler) to improve air flow in and out of your lungs. Using a vaporizer or a humidifier. These are machines that add water to the air to help you breathe better. Taking a medicine that thins mucus and clears congestion (expectorant). Taking a medicine that prevents or stops coughing (cough suppressant). It is notcommon to take an antibiotic medicine for this condition. Follow these instructions at home:  Take over-the-counter and prescription medicines only as told by your health care provider. Use an inhaler, vaporizer, or humidifier as told by your health care provider. Take two teaspoons (10 mL) of honey at bedtime to lessen coughing at night. Drink enough fluid to keep your urine  pale yellow. Do not use any products that contain nicotine or tobacco. These products include cigarettes, chewing tobacco, and vaping devices, such as e-cigarettes. If you need help quitting, ask your health care provider. Get plenty of rest. Return to your normal activities as told by your health care provider. Ask your health care provider what activities are safe for you. Keep all follow-up visits. This is important. How is this prevented? To lower your risk of getting this condition again: Wash your hands often with soap and water for at least 20 seconds. If soap and water are not available, use hand sanitizer. Avoid contact with people who have cold symptoms. Try not to touch your mouth, nose, or eyes with your hands. Avoid breathing in smoke or chemical fumes. Breathing smoke or chemical fumes will make your condition worse. Get the flu shot every year. Contact a health care provider  if: Your symptoms do not improve after 2 weeks. You have trouble coughing up the mucus. Your cough keeps you awake at night. You have a fever. Get help right away if you: Cough up blood. Feel pain in your chest. Have severe shortness of breath. Faint or keep feeling like you are going to faint. Have a severe headache. Have a fever or chills that get worse. These symptoms may represent a serious problem that is an emergency. Do not wait to see if the symptoms will go away. Get medical help right away. Call your local emergency services (911 in the [place] Do not drive yourself to the hospital. Summary Acute bronchitis is inflammation of the main airways (bronchi) that come off the windpipe (trachea) in the lungs. The swelling causes the airways to get smaller and make more mucus than normal. Drinking more fluids can help thin your mucus so it is easier to cough up. Take over-the-counter and prescription medicines only as told by your health care provider. Do not use any products that contain nicotine or  tobacco. These products include cigarettes, chewing tobacco, and vaping devices, such as e-cigarettes. If you need help quitting, ask your health care provider. Contact a health care provider if your symptoms do not improve after 2 weeks. This information is not intended to replace advice given to you by your health care provider. Make sure you discuss any questions you have with your health care provider. Document Revised: 11/25/2020 Document Reviewed: 11/25/2020 Elsevier Patient Education  2022 [employer].    If you have been instructed to have an in-person evaluation today at a local Urgent Care facility, please use the link below. It will take you to a list of all of our available [facility], including address, phone number and hours of operation. Please do not delay care.  [facility]  If you or a family member do not have a primary care provider, use the link below to schedule a visit and establish care. When you choose a [facility] primary care physician or advanced practice provider, you gain a long-term partner in health. Find a Primary Care Provider  Learn more about 's in-office and virtual care options: [facility]

## 2021-08-09 NOTE — Progress Notes (Signed)
Virtual Visit Consent   Patrick Chapman, you are scheduled for a virtual visit with a [facility] provider today.     Just as with appointments in the office, your consent must be obtained to participate.  Your consent will be active for this visit and any virtual visit you may have with one of our providers in the next 365 days.     If you have a MyChart account, a copy of this consent can be sent to you electronically.  All virtual visits are billed to your insurance company just like a traditional visit in the office.    As this is a virtual visit, video technology does not allow for your provider to perform a traditional examination.  This may limit your provider's ability to fully assess your condition.  If your provider identifies any concerns that need to be evaluated in person or the need to arrange testing (such as labs, EKG, etc.), we will make arrangements to do so.     Although advances in technology are sophisticated, we cannot ensure that it will always work on either your end or our end.  If the connection with a video visit is poor, the visit may have to be switched to a telephone visit.  With either a video or telephone visit, we are not always able to ensure that we have a secure connection.     I need to obtain your verbal consent now.   Are you willing to proceed with your visit today?    Patrick Chapman has provided verbal consent on 08/09/2021 for a virtual visit (video or telephone).   Mar Daring, PA-C   Date: 08/09/2021 9:38 AM   Virtual Visit via Video Note   I, Mar Daring, connected with  Patrick Chapman  ([id number], Apr 01, 1961) on 08/09/21 at  9:30 AM EST by a video-enabled telemedicine application and verified that I am speaking with the correct person using two identifiers.  Location: Patient: Virtual Visit Location Patient: Home Provider: Virtual Visit Location Provider: Home Office   I discussed the limitations of evaluation and management by  telemedicine and the availability of in person appointments. The patient expressed understanding and agreed to proceed.    History of Present Illness: Patrick Chapman is a 61 y.o. who identifies as a male who was assigned male at birth, and is being seen today for congestion.  HPI: URI  This is a new problem. The current episode started more than 1 month ago (started before thanksgiving and had started to improve but then got sick again over last week). The problem has been gradually worsening. There has been no fever. Associated symptoms include congestion, coughing, a plugged ear sensation (popping with swallowing, chewing, etc), rhinorrhea, sinus pain (now improved) and wheezing. Pertinent negatives include no diarrhea, ear pain, headaches, nausea, sore throat or vomiting. Associated symptoms comments: Post nasal drainage. Treatments tried: generic congestion and cough, tylenol. The treatment provided mild relief.     Problems:  Patient Active Problem List   Diagnosis Date Noted   Atypical atrial flutter ([place])    Pneumoperitoneum 07/14/2020   Abdominal pain    Diarrhea 03/26/2019   History of colon polyps 03/26/2019   Morbid obesity ([place]) 04/26/2017   Palpitations 04/25/2017   Cervical spondylosis with radiculopathy 08/15/2014   LUQ abdominal pain 01/14/2011   GERD (gastroesophageal reflux disease) 11/25/2010   Epigastric pain 11/25/2010   Hyperlipidemia 10/16/2009   CHEST PAIN UNSPECIFIED 10/16/2009   OVERWEIGHT  10/13/2009   Essential hypertension 10/13/2009    Allergies:  Allergies  Allergen Reactions   Codeine Nausea And Vomiting and Rash   Medications:  Current Outpatient Medications:    albuterol (VENTOLIN HFA) 108 (90 Base) MCG/ACT inhaler, Inhale 2 puffs into the lungs every 6 (six) hours as needed for wheezing or shortness of breath., Disp: 8 g, Rfl: 0   azithromycin (ZITHROMAX) 250 MG tablet, Take 2 tablets on day 1, then 1 tablet daily on days 2 through 5, Disp: 6  tablet, Rfl: 0   predniSONE (STERAPRED UNI-PAK 21 TAB) 10 MG (21) TBPK tablet, 6 day taper; take as directed on package instructions, Disp: 21 tablet, Rfl: 0   acetaminophen (TYLENOL) 500 MG tablet, Take 1,000 mg by mouth every 6 (six) hours as needed for moderate pain or headache., Disp: , Rfl:    ascorbic acid (VITAMIN C) 500 MG tablet, Take 500 mg by mouth daily., Disp: , Rfl:    colchicine 0.6 MG tablet, Take 0.6 mg by mouth daily., Disp: , Rfl:    dexlansoprazole (DEXILANT) 60 MG capsule, Take 1 capsule (60 mg total) by mouth daily. (Patient taking differently: Take 60 mg by mouth 2 (two) times daily.), Disp: 30 capsule, Rfl: 5   loratadine (CLARITIN) 10 MG tablet, Take 10 mg by mouth daily., Disp: , Rfl:    losartan (COZAAR) 50 MG tablet, Take 1 tablet (50 mg total) by mouth daily., Disp: 90 tablet, Rfl: 3   metoprolol succinate (TOPROL-XL) 50 MG 24 hr tablet, Take 1 tablet (50 mg total) by mouth daily. Take with or immediately following a meal., Disp: 90 tablet, Rfl: 3   tamsulosin (FLOMAX) 0.4 MG CAPS capsule, Take 0.4 mg by mouth in the morning and at bedtime. , Disp: , Rfl:   Observations/Objective: Patient is well-developed, well-nourished in no acute distress.  Resting comfortably at home.  Head is normocephalic, atraumatic.  No labored breathing.  Speech is clear and coherent with logical content.  Patient is alert and oriented at baseline.    Assessment and Plan: 1. Acute bacterial bronchitis - azithromycin (ZITHROMAX) 250 MG tablet; Take 2 tablets on day 1, then 1 tablet daily on days 2 through 5  Dispense: 6 tablet; Refill: 0 - predniSONE (STERAPRED UNI-PAK 21 TAB) 10 MG (21) TBPK tablet; 6 day taper; take as directed on package instructions  Dispense: 21 tablet; Refill: 0 - albuterol (VENTOLIN HFA) 108 (90 Base) MCG/ACT inhaler; Inhale 2 puffs into the lungs every 6 (six) hours as needed for wheezing or shortness of breath.  Dispense: 8 g; Refill: 0  - Suspect second  sickening that has been worsening despite OTC medications - Will treat with zpak, prednisone and albuterol - Continue OTC medications of choice for symptomatic management - Push fluids.  - Rest.  - Seek in person evaluation if worsening or fails to improve   Follow Up Instructions: I discussed the assessment and treatment plan with the patient. The patient was provided an opportunity to ask questions and all were answered. The patient agreed with the plan and demonstrated an understanding of the instructions.  A copy of instructions were sent to the patient via MyChart unless otherwise noted below.    The patient was advised to call back or seek an in-person evaluation if the symptoms worsen or if the condition fails to improve as anticipated.  Time:  I spent 15 minutes with the patient via telehealth technology discussing the above problems/concerns.    Mar Daring, PA-C

## 2021-08-11 ENCOUNTER — Other Ambulatory Visit: Payer: Self-pay | Admitting: Nurse Practitioner

## 2021-08-11 MED ORDER — METOPROLOL SUCCINATE ER 50 MG PO TB24: ORAL_TABLET | ORAL | 2 refills | Status: DC

## 2021-08-11 NOTE — Addendum Note (Signed)
Addended by: Carter Kitten D on: 08/11/2021 02:11 PM   Modules accepted: Orders

## 2021-10-23 ENCOUNTER — Telehealth: Payer: BC Managed Care – PPO | Admitting: Nurse Practitioner

## 2021-10-23 DIAGNOSIS — J019 Acute sinusitis, unspecified: Secondary | ICD-10-CM

## 2021-10-23 MED ORDER — AMOXICILLIN-POT CLAVULANATE 875-125 MG PO TABS: 1.0000 | ORAL_TABLET | Freq: Two times a day (BID) | ORAL | 0 refills | Status: DC

## 2021-10-23 NOTE — Progress Notes (Signed)
E-Visit for Sinus Problems ? ?We are sorry that you are not feeling well.  Here is how we plan to help! ? ?Based on what you have shared with me it looks like you have sinusitis.  Sinusitis is inflammation and infection in the sinus cavities of the head.  Based on your presentation I believe you most likely have Acute Bacterial Sinusitis.  This is an infection caused by bacteria and is treated with antibiotics. I have prescribed Augmentin '875mg'$ /'125mg'$  one tablet twice daily with food, for 7 days. You may use an oral decongestant such as Mucinex D or if you have glaucoma or high blood pressure use plain Mucinex. Saline nasal spray help and can safely be used as often as needed for congestion.  If you develop worsening sinus pain, fever or notice severe headache and vision changes, or if symptoms are not better after completion of antibiotic, please schedule an appointment with a health care provider.   ? ?Sinus infections are not as easily transmitted as other respiratory infection, however we still recommend that you avoid close contact with loved ones, especially the very young and elderly.  Remember to wash your hands thoroughly throughout the day as this is the number one way to prevent the spread of infection! ? ?Home Care: ?Only take medications as instructed by your medical team. ?Complete the entire course of an antibiotic. ?Do not take these medications with alcohol. ?A steam or ultrasonic humidifier can help congestion.  You can place a towel over your head and breathe in the steam from hot water coming from a faucet. ?Avoid close contacts especially the very young and the elderly. ?Cover your mouth when you cough or sneeze. ?Always remember to wash your hands. ? ?Get Help Right Away If: ?You develop worsening fever or sinus pain. ?You develop a severe head ache or visual changes. ?Your symptoms persist after you have completed your treatment plan. ? ?Make sure you ?Understand these instructions. ?Will watch  your condition. ?Will get help right away if you are not doing well or get worse. ? ?Thank you for choosing an e-visit. ? ?Your e-visit answers were reviewed by a board certified advanced clinical practitioner to complete your personal care plan. Depending upon the condition, your plan could have included both over the counter or prescription medications. ? ?Please review your pharmacy choice. Make sure the pharmacy is open so you can pick up prescription now. If there is a problem, you may contact your provider through [employer] and have the prescription routed to another pharmacy.  Your safety is important to [place]. If you have drug allergies check your prescription carefully.  ? ?For the next 24 hours you can use MyChart to ask questions about today's visit, request a non-urgent call back, or ask for a work or school excuse. ?You will get an email in the next two days asking about your experience. I hope that your e-visit has been valuable and will speed your recovery.  ? ?I spent approximately 5 minutes reviewing the patient's history, current symptoms and coordinating their plan of care today.   ? ?Meds ordered this encounter  ?Medications  ? amoxicillin-clavulanate (AUGMENTIN) 875-125 MG tablet  ?  Sig: Take 1 tablet by mouth 2 (two) times daily for 7 days. Take with food  ?  Dispense:  14 tablet  ?  Refill:  0  ?  ?

## 2021-10-25 MED ORDER — DOXYCYCLINE HYCLATE 100 MG PO TABS
100.0000 mg | ORAL_TABLET | Freq: Two times a day (BID) | ORAL | 0 refills | Status: DC
Start: 2021-10-25 — End: 2022-09-07

## 2021-10-25 NOTE — Addendum Note (Signed)
Addended by: Mar Daring on: 10/25/2021 08:27 AM ? ? Modules accepted: Orders ? ?

## 2021-11-15 ENCOUNTER — Ambulatory Visit: Payer: Self-pay

## 2022-03-16 ENCOUNTER — Encounter: Payer: Self-pay | Admitting: Internal Medicine

## 2022-05-13 ENCOUNTER — Other Ambulatory Visit: Payer: Self-pay | Admitting: Cardiology

## 2022-06-10 ENCOUNTER — Other Ambulatory Visit: Payer: Self-pay | Admitting: Cardiology

## 2022-07-11 ENCOUNTER — Other Ambulatory Visit: Payer: Self-pay | Admitting: Cardiology

## 2022-07-26 ENCOUNTER — Encounter: Payer: Self-pay | Admitting: Cardiology

## 2022-07-27 NOTE — Telephone Encounter (Signed)
Can he come in for vitals check and EKG, may be back in the abnormal heart rhythm that we had seen some time ago. Keep current Jan appt with me so far.   Zandra Abts MD

## 2022-08-30 ENCOUNTER — Ambulatory Visit: Payer: BC Managed Care – PPO | Admitting: Cardiology

## 2022-08-30 NOTE — Progress Notes (Deleted)
Clinical Summary Patrick Chapman is a 62 y.o.male  seen today for follow up of the following medical problems.     1. Aflutter - new diagnosis during admission with sepsis/bowel perforation in 07/2020 -  status post TEE/DCCV 07/23/2020. Plan was for short course of amiodarone   -he has history of chronic mild palpitations for several years which are ongoing. No severe symptoms, heart rate checks at home have been normal - remains on eliquis at this time.    - arrythmia occurred in setting of severe systemic illness, in absence of clinical recurrence and low CHADS2Vasc score of 1 we stopped eliquis   - no recent palpitations -    2. Systolic dysfunction - 123456 echo LVEF 60-65% - 07/23/20 TEE LVEF 55-60% by offical report, the prelim report mentioned 35-40%. This was in the setting of uncontrolled aflutter - 10/2020 TTE LVEF 60-65%, no WMAs, normal RV function   - no recent SOB/DOE. Chronic LLE edema unchanged       Past Medical History:  Diagnosis Date   Atypical atrial flutter (HCC)    GERD (gastroesophageal reflux disease)    Gout    Hypertension    Obesity    Sleep apnea    was tested, but had lap band surgery and with weight loss, no problems     Allergies  Allergen Reactions   Codeine Nausea And Vomiting and Rash   Augmentin [Amoxicillin-Pot Clavulanate] Rash     Current Outpatient Medications  Medication Sig Dispense Refill   acetaminophen (TYLENOL) 500 MG tablet Take 1,000 mg by mouth every 6 (six) hours as needed for moderate pain or headache.     albuterol (VENTOLIN HFA) 108 (90 Base) MCG/ACT inhaler Inhale 2 puffs into the lungs every 6 (six) hours as needed for wheezing or shortness of breath. 8 g 0   ascorbic acid (VITAMIN C) 500 MG tablet Take 500 mg by mouth daily.     colchicine 0.6 MG tablet Take 0.6 mg by mouth daily.     dexlansoprazole (DEXILANT) 60 MG capsule Take 1 capsule (60 mg total) by mouth daily. (Patient taking differently: Take 60  mg by mouth 2 (two) times daily.) 30 capsule 5   doxycycline (VIBRA-TABS) 100 MG tablet Take 1 tablet (100 mg total) by mouth 2 (two) times daily. 20 tablet 0   loratadine (CLARITIN) 10 MG tablet Take 10 mg by mouth daily.     losartan (COZAAR) 50 MG tablet Take 1 tablet (50 mg total) by mouth daily. 90 tablet 3   metoprolol succinate (TOPROL-XL) 50 MG 24 hr tablet TAKE ONE TABLET (50MG TOTAL) BY MOUTH DAILY. TAKE WITH OR IMMEDIATELY FOLLOWING A MEAL. 30 tablet 2   predniSONE (STERAPRED UNI-PAK 21 TAB) 10 MG (21) TBPK tablet 6 day taper; take as directed on package instructions 21 tablet 0   tamsulosin (FLOMAX) 0.4 MG CAPS capsule Take 0.4 mg by mouth in the morning and at bedtime.      No current facility-administered medications for this visit.     Past Surgical History:  Procedure Laterality Date   ANTERIOR CERVICAL DECOMP/DISCECTOMY FUSION N/A 08/15/2014   Procedure: ANTERIOR CERVICAL DECOMPRESSION/DISCECTOMY FUSION 1 LEVEL;  Surgeon: Consuella Lose, MD;  Location: [facility];  Service: Neurosurgery;  Laterality: N/A;  C56 anterior cervical fusion with interbody prosthesis plating and bonegraft C 5-6   APPLICATION OF WOUND VAC N/A 07/14/2020   Procedure: APPLICATION OF WOUND VAC;  Surgeon: Rolm Bookbinder, MD;  Location: [facility]  OR;  Service: General;  Laterality: N/A;   BIOPSY  10/10/2019   Procedure: BIOPSY;  Surgeon: Daneil Dolin, MD;  [facility];  Service: Endoscopy;;   CARDIOVERSION N/A 07/23/2020   Procedure: CARDIOVERSION;  Surgeon: Acie Fredrickson Wonda Cheng, MD;  Location: [facility] ENDOSCOPY;  Service: Cardiovascular;  Laterality: N/A;   CHOLECYSTECTOMY     COLONOSCOPY WITH ESOPHAGOGASTRODUODENOSCOPY (EGD)  06/12/2012   Dr. Gala Romney: Status post prior lap band placement, otherwise normal study.  2 tubular adenomas removed from the colon.  Next colonoscopy in 5 years.   COLONOSCOPY WITH PROPOFOL N/A 10/10/2019   Procedure: COLONOSCOPY WITH PROPOFOL;  Surgeon: Daneil Dolin, MD;   [facility];  Service: Endoscopy;  Laterality: N/A;  11:00am   LAPAROSCOPIC GASTRIC BANDING  2009   Dr. Hassell Done   LAPAROTOMY N/A 07/14/2020   Procedure: EXPLORATORY LAPAROTOMY WITH REMOVAL OF LAP BAND AND COMPONENTS;  Surgeon: Rolm Bookbinder, MD;  Location: [place];  Service: General;  Laterality: N/A;   TEE WITHOUT CARDIOVERSION N/A 07/23/2020   Procedure: TRANSESOPHAGEAL ECHOCARDIOGRAM (TEE);  Surgeon: Acie Fredrickson Wonda Cheng, MD;  Location: [facility] ENDOSCOPY;  Service: Cardiovascular;  Laterality: N/A;     Allergies  Allergen Reactions   Codeine Nausea And Vomiting and Rash   Augmentin [Amoxicillin-Pot Clavulanate] Rash      Family History  Problem Relation Age of Onset   Heart attack Mother        living   Heart attack Father        living   Diabetes Father    Colon cancer Neg Hx      Social History Mr. Pawelski reports that he quit smoking about 35 years ago. His smoking use included cigarettes. He started smoking about 45 years ago. He has a 10.00 pack-year smoking history. His smokeless tobacco use includes snuff. Mr. Novosad reports that he does not currently use alcohol.   Review of Systems CONSTITUTIONAL: No weight loss, fever, chills, weakness or fatigue.  HEENT: Eyes: No visual loss, blurred vision, double vision or yellow sclerae.No hearing loss, sneezing, congestion, runny nose or sore throat.  SKIN: No rash or itching.  CARDIOVASCULAR:  RESPIRATORY: No shortness of breath, cough or sputum.  GASTROINTESTINAL: No anorexia, nausea, vomiting or diarrhea. No abdominal pain or blood.  GENITOURINARY: No burning on urination, no polyuria NEUROLOGICAL: No headache, dizziness, syncope, paralysis, ataxia, numbness or tingling in the extremities. No change in bowel or bladder control.  MUSCULOSKELETAL: No muscle, back pain, joint pain or stiffness.  LYMPHATICS: No enlarged nodes. No history of splenectomy.  PSYCHIATRIC: No history of depression or anxiety.   ENDOCRINOLOGIC: No reports of sweating, cold or heat intolerance. No polyuria or polydipsia.  Marland Kitchen   Physical Examination There were no vitals filed for this visit. There were no vitals filed for this visit.  Gen: resting comfortably, no acute distress HEENT: no scleral icterus, pupils equal round and reactive, no palptable cervical adenopathy,  CV Resp: Clear to auscultation bilaterally GI: abdomen is soft, non-tender, non-distended, normal bowel sounds, no hepatosplenomegaly MSK: extremities are warm, no edema.  Skin: warm, no rash Neuro:  no focal deficits Psych: appropriate affect   Diagnostic Studies     Assessment and Plan  1. Aflutter - new diagnosis during admission in 07/2020 with sepsis, abdominal perforation - s/p TEE/DCCV, transient course of amiodarone.  - appears this was transient in the setting of severe systemic stress, has not had signs of recurrence. Without recurrence and CHADS2Vasc score of 1 he is  off eliquls EKG today shows NSR - continue to monitor     2. Systolic dysfunction - transient in setting of uncontrolled aflutter, sepsis.  - repeat echo shows normal function - no new symptmos - continue to monitor.      3. HTN - elevated today, significant weight gain over the last few months - he will update [place] on home bp's in 2 weeks, if consistent elevated trend would increase losartan to 54m daily.       JArnoldo Lenis M.D., F.A.C.C.

## 2022-09-07 ENCOUNTER — Encounter: Payer: Self-pay | Admitting: Cardiology

## 2022-09-07 ENCOUNTER — Ambulatory Visit: Payer: Managed Care, Other (non HMO) | Attending: Cardiology | Admitting: Cardiology

## 2022-09-07 VITALS — BP 150/85 | HR 92 | Ht 72.0 in | Wt 315.2 lb

## 2022-09-07 DIAGNOSIS — I10 Essential (primary) hypertension: Secondary | ICD-10-CM | POA: Diagnosis not present

## 2022-09-07 DIAGNOSIS — E782 Mixed hyperlipidemia: Secondary | ICD-10-CM | POA: Diagnosis not present

## 2022-09-07 DIAGNOSIS — I4892 Unspecified atrial flutter: Secondary | ICD-10-CM

## 2022-09-07 NOTE — Progress Notes (Signed)
Clinical Summary Patrick Chapman is a 62 y.o.male seen today for follow up of the following medical problems.     1. Aflutter - new diagnosis during admission with sepsis/bowel perforation in 07/2020 -  status post TEE/DCCV 07/23/2020. Plan was for short course of amiodarone   -he has history of chronic mild palpitations for several years which are ongoing. No severe symptoms, heart rate checks at home have been normal - remains on eliquis at this time.    - arrythmia occurred in setting of severe systemic illness, in absence of clinical recurrence and low CHADS2Vasc score of 1 we stopped eliquis   - no significant palpitaitions, watch has reported some HRs 100s at rest and 130s with activity at times. No sustained tachcycardia   2. History of Systolic dysfunction - 59/1/63 echo LVEF 60-65% - 07/23/20 TEE LVEF 55-60% by offical report, the prelim report mentioned 35-40%. This was in the setting of uncontrolled aflutter - 10/2020 TTE LVEF 60-65%, no WMAs, normal RV function       3. Weight loss - significant weight loss with ozempic   4. HTN - compliant with meds  5. Hyperlipidemia - 06/2022 TC 206 TG 204 HDL 46 LDL 124 - compliant with meds   Past Medical History:  Diagnosis Date   Atypical atrial flutter (HCC)    GERD (gastroesophageal reflux disease)    Gout    Hypertension    Obesity    Sleep apnea    was tested, but had lap band surgery and with weight loss, no problems     Allergies  Allergen Reactions   Codeine Nausea And Vomiting and Rash   Augmentin [Amoxicillin-Pot Clavulanate] Rash     Current Outpatient Medications  Medication Sig Dispense Refill   acetaminophen (TYLENOL) 500 MG tablet Take 1,000 mg by mouth every 6 (six) hours as needed for moderate pain or headache.     albuterol (VENTOLIN HFA) 108 (90 Base) MCG/ACT inhaler Inhale 2 puffs into the lungs every 6 (six) hours as needed for wheezing or shortness of breath. 8 g 0   ascorbic acid  (VITAMIN C) 500 MG tablet Take 500 mg by mouth daily.     colchicine 0.6 MG tablet Take 0.6 mg by mouth daily.     dexlansoprazole (DEXILANT) 60 MG capsule Take 1 capsule (60 mg total) by mouth daily. (Patient taking differently: Take 60 mg by mouth 2 (two) times daily.) 30 capsule 5   doxycycline (VIBRA-TABS) 100 MG tablet Take 1 tablet (100 mg total) by mouth 2 (two) times daily. 20 tablet 0   loratadine (CLARITIN) 10 MG tablet Take 10 mg by mouth daily.     losartan (COZAAR) 50 MG tablet Take 1 tablet (50 mg total) by mouth daily. 90 tablet 3   metoprolol succinate (TOPROL-XL) 50 MG 24 hr tablet TAKE ONE TABLET ('50MG'$  TOTAL) BY MOUTH DAILY. TAKE WITH OR IMMEDIATELY FOLLOWING A MEAL. 30 tablet 2   predniSONE (STERAPRED UNI-PAK 21 TAB) 10 MG (21) TBPK tablet 6 day taper; take as directed on package instructions 21 tablet 0   tamsulosin (FLOMAX) 0.4 MG CAPS capsule Take 0.4 mg by mouth in the morning and at bedtime.      No current facility-administered medications for this visit.     Past Surgical History:  Procedure Laterality Date   ANTERIOR CERVICAL DECOMP/DISCECTOMY FUSION N/A 08/15/2014   Procedure: ANTERIOR CERVICAL DECOMPRESSION/DISCECTOMY FUSION 1 LEVEL;  Surgeon: Consuella Lose, MD;  Location: [facility];  Service: Neurosurgery;  Laterality: N/A;  C56 anterior cervical fusion with interbody prosthesis plating and bonegraft C 5-6   APPLICATION OF WOUND VAC N/A 07/14/2020   Procedure: APPLICATION OF WOUND VAC;  Surgeon: Rolm Bookbinder, MD;  Location: [place];  Service: General;  Laterality: N/A;   BIOPSY  10/10/2019   Procedure: BIOPSY;  Surgeon: Daneil Dolin, MD;  [facility];  Service: Endoscopy;;   CARDIOVERSION N/A 07/23/2020   Procedure: CARDIOVERSION;  Surgeon: Thayer Headings, MD;  Location: [facility] ENDOSCOPY;  Service: Cardiovascular;  Laterality: N/A;   CHOLECYSTECTOMY     COLONOSCOPY WITH ESOPHAGOGASTRODUODENOSCOPY (EGD)  06/12/2012   Dr. Gala Romney: Status post  prior lap band placement, otherwise normal study.  2 tubular adenomas removed from the colon.  Next colonoscopy in 5 years.   COLONOSCOPY WITH PROPOFOL N/A 10/10/2019   Procedure: COLONOSCOPY WITH PROPOFOL;  Surgeon: Daneil Dolin, MD;  [facility];  Service: Endoscopy;  Laterality: N/A;  11:00am   LAPAROSCOPIC GASTRIC BANDING  2009   Dr. Hassell Done   LAPAROTOMY N/A 07/14/2020   Procedure: EXPLORATORY LAPAROTOMY WITH REMOVAL OF LAP BAND AND COMPONENTS;  Surgeon: Rolm Bookbinder, MD;  Location: [place];  Service: General;  Laterality: N/A;   TEE WITHOUT CARDIOVERSION N/A 07/23/2020   Procedure: TRANSESOPHAGEAL ECHOCARDIOGRAM (TEE);  Surgeon: Acie Fredrickson Wonda Cheng, MD;  Location: [facility] ENDOSCOPY;  Service: Cardiovascular;  Laterality: N/A;     Allergies  Allergen Reactions   Codeine Nausea And Vomiting and Rash   Augmentin [Amoxicillin-Pot Clavulanate] Rash      Family History  Problem Relation Age of Onset   Heart attack Mother        living   Heart attack Father        living   Diabetes Father    Colon cancer Neg Hx      Social History Mr. Montoya reports that he quit smoking about 35 years ago. His smoking use included cigarettes. He started smoking about 45 years ago. He has a 10.00 pack-year smoking history. His smokeless tobacco use includes snuff. Mr. Cocuzza reports that he does not currently use alcohol.   Review of Systems CONSTITUTIONAL: No weight loss, fever, chills, weakness or fatigue.  HEENT: Eyes: No visual loss, blurred vision, double vision or yellow sclerae.No hearing loss, sneezing, congestion, runny nose or sore throat.  SKIN: No rash or itching.  CARDIOVASCULAR: per hpi RESPIRATORY: No shortness of breath, cough or sputum.  GASTROINTESTINAL: No anorexia, nausea, vomiting or diarrhea. No abdominal pain or blood.  GENITOURINARY: No burning on urination, no polyuria NEUROLOGICAL: No headache, dizziness, syncope, paralysis, ataxia, numbness or tingling in the  extremities. No change in bowel or bladder control.  MUSCULOSKELETAL: No muscle, back pain, joint pain or stiffness.  LYMPHATICS: No enlarged nodes. No history of splenectomy.  PSYCHIATRIC: No history of depression or anxiety.  ENDOCRINOLOGIC: No reports of sweating, cold or heat intolerance. No polyuria or polydipsia.  Marland Kitchen   Physical Examination Today's Vitals   09/07/22 1548  BP: (!) 163/89  Pulse: 92  SpO2: 95%  Weight: (!) 315 lb 3.2 oz (143 kg)  Height: 6' (1.829 m)   Body mass index is 42.75 kg/m.  Gen: resting comfortably, no acute distress HEENT: no scleral icterus, pupils equal round and reactive, no palptable cervical adenopathy,  CV: RRR, no mrg, no jvd Resp: Clear to auscultation bilaterally GI: abdomen is soft, non-tender, non-distended, normal bowel sounds, no hepatosplenomegaly MSK: extremities are warm, no edema.  Skin: warm, no  rash Neuro:  no focal deficits Psych: appropriate affect   Diagnostic Studies     Assessment and Plan   1. Aflutter - new diagnosis during admission in 07/2020 with sepsis, abdominal perforation - s/p TEE/DCCV, transient course of amiodarone.  - appears this was transient in the setting of severe systemic stress, has not had signs of recurrence. Without recurrence and CHADS2Vasc score of 1 he is off eliquls - no symptoms, EKG today continues to show SR - continue to monitor    2. Hyperlipidemia -LDL at goal , discussed dietary changes to improve TGs      3. HTN - elevated here, he reports home bp's have been at goal. Call us with updated home bp's Friday, would increase norvasc if needed to '10mg'$  daily   F/u 6 months  Arnoldo Lenis, M.D.

## 2022-09-07 NOTE — Addendum Note (Signed)
Addended by: Obie Dredge A on: 09/07/2022 04:47 PM   Modules accepted: Orders

## 2022-09-07 NOTE — Patient Instructions (Addendum)
Medication Instructions:  Your physician recommends that you continue on your current medications as directed. Please refer to the Current Medication list given to you today.  Labwork: none  Testing/Procedures: none  Follow-Up: Your physician recommends that you schedule a follow-up appointment in: 6 months  Any Other Special Instructions Will Be Listed Below (If Applicable). Your physician has requested that you regularly monitor and record your blood pressure readings at home. Please use the same machine at the same time of day to check your readings and record them. Please call our office Friday with your home blood pressure readings  If you need a refill on your cardiac medications before your next appointment, please call your pharmacy.

## 2022-09-12 ENCOUNTER — Encounter: Payer: Self-pay | Admitting: Cardiology

## 2022-09-12 ENCOUNTER — Other Ambulatory Visit: Payer: Self-pay

## 2022-09-12 MED ORDER — AMLODIPINE BESYLATE 10 MG PO TABS: 10.0000 mg | ORAL_TABLET | Freq: Every day | ORAL | 3 refills | Status: DC

## 2022-09-12 NOTE — Telephone Encounter (Signed)
BP's are above goal, please increase norvasc to '10mg'$  daily  Zandra Abts MD

## 2022-10-10 ENCOUNTER — Other Ambulatory Visit: Payer: Self-pay | Admitting: *Deleted

## 2022-10-10 MED ORDER — METOPROLOL SUCCINATE ER 50 MG PO TB24: ORAL_TABLET | ORAL | 3 refills | Status: DC

## 2022-10-10 MED ORDER — ROSUVASTATIN CALCIUM 5 MG PO TABS: 5.0000 mg | ORAL_TABLET | Freq: Every day | ORAL | 3 refills | Status: AC

## 2022-11-25 ENCOUNTER — Telehealth: Payer: Managed Care, Other (non HMO) | Admitting: Family Medicine

## 2022-11-25 DIAGNOSIS — B9689 Other specified bacterial agents as the cause of diseases classified elsewhere: Secondary | ICD-10-CM

## 2022-11-25 DIAGNOSIS — J019 Acute sinusitis, unspecified: Secondary | ICD-10-CM | POA: Diagnosis not present

## 2022-11-25 DIAGNOSIS — J3089 Other allergic rhinitis: Secondary | ICD-10-CM

## 2022-11-25 MED ORDER — DOXYCYCLINE HYCLATE 100 MG PO TABS: 100.0000 mg | ORAL_TABLET | Freq: Two times a day (BID) | ORAL | 0 refills | Status: AC

## 2022-11-25 MED ORDER — LEVOCETIRIZINE DIHYDROCHLORIDE 5 MG PO TABS: 5.0000 mg | ORAL_TABLET | Freq: Every evening | ORAL | 0 refills | Status: DC

## 2022-11-25 MED ORDER — FLUTICASONE PROPIONATE 50 MCG/ACT NA SUSP: 2.0000 | Freq: Every day | NASAL | 0 refills | Status: DC

## 2022-11-25 MED ORDER — BENZONATATE 100 MG PO CAPS
200.0000 mg | ORAL_CAPSULE | Freq: Two times a day (BID) | ORAL | 0 refills | Status: DC | PRN
Start: 2022-11-25 — End: 2023-02-22

## 2022-11-25 NOTE — Patient Instructions (Addendum)
Loran Senters, thank you for joining Freddy Finner, NP for today's virtual visit.  While this provider is not your primary care provider (PCP), if your PCP is located in our provider database this encounter information will be shared with them immediately following your visit.   A [facility] MyChart account gives you access to today's visit and all your visits, tests, and labs performed at [facility] " click here if you don't have a [facility] MyChart account or go to [email or web address]  Consent: (Patient) Patrick Chapman provided verbal consent for this virtual visit at the beginning of the encounter.  Current Medications:  Current Outpatient Medications:    amLODipine (NORVASC) 10 MG tablet, Take 1 tablet (10 mg total) by mouth daily., Disp: 90 tablet, Rfl: 3   benzonatate (TESSALON) 100 MG capsule, Take 2 capsules (200 mg total) by mouth 2 (two) times daily as needed for cough., Disp: 30 capsule, Rfl: 0   doxycycline (VIBRA-TABS) 100 MG tablet, Take 1 tablet (100 mg total) by mouth 2 (two) times daily for 10 days., Disp: 20 tablet, Rfl: 0   fluticasone (FLONASE) 50 MCG/ACT nasal spray, Place 2 sprays into both nostrils daily., Disp: 16 g, Rfl: 0   levocetirizine (XYZAL) 5 MG tablet, Take 1 tablet (5 mg total) by mouth every evening., Disp: 30 tablet, Rfl: 0   acetaminophen (TYLENOL) 500 MG tablet, Take 1,000 mg by mouth every 6 (six) hours as needed for moderate pain or headache., Disp: , Rfl:    albuterol (VENTOLIN HFA) 108 (90 Base) MCG/ACT inhaler, Inhale 2 puffs into the lungs every 6 (six) hours as needed for wheezing or shortness of breath., Disp: 8 g, Rfl: 0   ascorbic acid (VITAMIN C) 500 MG tablet, Take 500 mg by mouth daily., Disp: , Rfl:    colchicine 0.6 MG tablet, Take 0.6 mg by mouth daily. Prn gout flare, Disp: , Rfl:    dexlansoprazole (DEXILANT) 60 MG capsule, Take 1 capsule (60 mg total) by mouth daily. (Patient taking differently: Take 60 mg by mouth  2 (two) times daily.), Disp: 30 capsule, Rfl: 5   losartan (COZAAR) 50 MG tablet, Take 1 tablet (50 mg total) by mouth daily., Disp: 90 tablet, Rfl: 3   metoprolol succinate (TOPROL-XL) 50 MG 24 hr tablet, Take with or immediately following a meal., Disp: 30 tablet, Rfl: 3   OZEMPIC, 0.25 OR 0.5 MG/DOSE, 2 MG/3ML SOPN, Inject 0.5 mg into the skin once a week., Disp: , Rfl:    rosuvastatin (CRESTOR) 5 MG tablet, Take 1 tablet (5 mg total) by mouth daily., Disp: 30 tablet, Rfl: 3   tadalafil (CIALIS) 20 MG tablet, Take 20 mg by mouth daily as needed for erectile dysfunction., Disp: , Rfl:    tamsulosin (FLOMAX) 0.4 MG CAPS capsule, Take 0.4 mg by mouth in the morning and at bedtime. , Disp: , Rfl:    Medications ordered in this encounter:  Meds ordered this encounter  Medications   doxycycline (VIBRA-TABS) 100 MG tablet    Sig: Take 1 tablet (100 mg total) by mouth 2 (two) times daily for 10 days.    Dispense:  20 tablet    Refill:  0    Order Specific Question:   Supervising Provider    Answer:   Merrilee Jansky [[id number]]   fluticasone (FLONASE) 50 MCG/ACT nasal spray    Sig: Place 2 sprays into both nostrils daily.    Dispense:  16 g    Refill:  0    Order Specific Question:   Supervising Provider    Answer:   Merrilee Jansky [[id number]]   benzonatate (TESSALON) 100 MG capsule    Sig: Take 2 capsules (200 mg total) by mouth 2 (two) times daily as needed for cough.    Dispense:  30 capsule    Refill:  0    Order Specific Question:   Supervising Provider    Answer:   Merrilee Jansky [[id number]]   levocetirizine (XYZAL) 5 MG tablet    Sig: Take 1 tablet (5 mg total) by mouth every evening.    Dispense:  30 tablet    Refill:  0    Order Specific Question:   Supervising Provider    Answer:   Merrilee Jansky [id number]     *If you need refills on other medications prior to your next appointment, please contact your pharmacy*  Follow-Up: Call back or seek an in-person evaluation  if the symptoms worsen or if the condition fails to improve as anticipated.  [facility] [phone]  Other Instructions   -Take meds as prescribed -Rest -Use a cool mist humidifier especially during the winter months when heat dries out the air. - Use saline nose sprays frequently to help soothe nasal passages and promote drainage. -Saline irrigations of the nose can be very helpful if done frequently.             * 4X daily for 1 week*             * Use of a nettie pot can be helpful with this.  *Follow directions with this* *Boiled or distilled water only -stay hydrated by drinking plenty of fluids - Keep thermostat turn down low to prevent drying out sinuses - For any cough or congestion- robitussin DM or Delsym as needed - For fever or aches or pains- take tylenol or ibuprofen as directed on bottle             * for fevers greater than 101 orally you may alternate ibuprofen and tylenol every 3 hours.  If you do not improve you will need a follow up visit in person.                  If you have been instructed to have an in-person evaluation today at a local Urgent Care facility, please use the link below. It will take you to a list of all of our available [facility], including address, phone number and hours of operation. Please do not delay care.  [facility]  If you or a family member do not have a primary care provider, use the link below to schedule a visit and establish care. When you choose a [facility] primary care physician or advanced practice provider, you gain a long-term partner in health. Find a Primary Care Provider  Learn more about [facility] in-office and virtual care options: [place] - Get Care Now

## 2022-11-25 NOTE — Progress Notes (Signed)
Virtual Visit Consent   Patrick Chapman, you are scheduled for a virtual visit with a [facility] provider today. Just as with appointments in the office, your consent must be obtained to participate. Your consent will be active for this visit and any virtual visit you may have with one of our providers in the next 365 days. If you have a MyChart account, a copy of this consent can be sent to you electronically.  As this is a virtual visit, video technology does not allow for your provider to perform a traditional examination. This may limit your provider's ability to fully assess your condition. If your provider identifies any concerns that need to be evaluated in person or the need to arrange testing (such as labs, EKG, etc.), we will make arrangements to do so. Although advances in technology are sophisticated, we cannot ensure that it will always work on either your end or our end. If the connection with a video visit is poor, the visit may have to be switched to a telephone visit. With either a video or telephone visit, we are not always able to ensure that we have a secure connection.  By engaging in this virtual visit, you consent to the provision of healthcare and authorize for your insurance to be billed (if applicable) for the services provided during this visit. Depending on your insurance coverage, you may receive a charge related to this service.  I need to obtain your verbal consent now. Are you willing to proceed with your visit today? Patrick Chapman has provided verbal consent on 11/25/2022 for a virtual visit (video or telephone). Freddy Finner, NP  Date: 11/25/2022 10:10 AM  Virtual Visit via Video Note   I, Freddy Finner, connected with  Patrick Chapman  ([id number], June 02, 1961) on 11/25/22 at 10:15 AM EDT by a video-enabled telemedicine application and verified that I am speaking with the correct person using two identifiers.  Location: Patient: Virtual Visit Location Patient:  Home Provider: Virtual Visit Location Provider: Home Office   I discussed the limitations of evaluation and management by telemedicine and the availability of in person appointments. The patient expressed understanding and agreed to proceed.    History of Present Illness: Patrick Chapman is a 62 y.o. who identifies as a male who was assigned male at birth, and is being seen today for sinus issues  Onset was 5 weeks ago- had a cold around 7-10 days- Around April the 1st got sudafed. Still having drainage- nasal- and sinus pressure. The sudafed medication help with congestion removal. Reports feeling better. But is still having on going drainage PND especially at night. Will have cough that notes rattle- clear. Associated symptoms- cheek pressure in sinus area under eyes- noted drainage into ear. Modifying factors are sudafed, claritin daily - not helping has been on for a long time.  Denies chest pain, shortness of breath, fevers, chills   Problems:  Patient Active Problem List   Diagnosis Date Noted   Atypical atrial flutter    Pneumoperitoneum 07/14/2020   Abdominal pain    Diarrhea 03/26/2019   History of colon polyps 03/26/2019   Morbid obesity 04/26/2017   Palpitations 04/25/2017   Cervical spondylosis with radiculopathy 08/15/2014   LUQ abdominal pain 01/14/2011   GERD (gastroesophageal reflux disease) 11/25/2010   Epigastric pain 11/25/2010   Hyperlipidemia 10/16/2009   CHEST PAIN UNSPECIFIED 10/16/2009   OVERWEIGHT 10/13/2009   Essential hypertension 10/13/2009    Allergies:  Allergies  Allergen  Reactions   Codeine Nausea And Vomiting and Rash   Augmentin [Amoxicillin-Pot Clavulanate] Rash   Medications:  Current Outpatient Medications:    amLODipine (NORVASC) 10 MG tablet, Take 1 tablet (10 mg total) by mouth daily., Disp: 90 tablet, Rfl: 3   acetaminophen (TYLENOL) 500 MG tablet, Take 1,000 mg by mouth every 6 (six) hours as needed for moderate pain or headache.,  Disp: , Rfl:    albuterol (VENTOLIN HFA) 108 (90 Base) MCG/ACT inhaler, Inhale 2 puffs into the lungs every 6 (six) hours as needed for wheezing or shortness of breath., Disp: 8 g, Rfl: 0   ascorbic acid (VITAMIN C) 500 MG tablet, Take 500 mg by mouth daily., Disp: , Rfl:    colchicine 0.6 MG tablet, Take 0.6 mg by mouth daily. Prn gout flare, Disp: , Rfl:    dexlansoprazole (DEXILANT) 60 MG capsule, Take 1 capsule (60 mg total) by mouth daily. (Patient taking differently: Take 60 mg by mouth 2 (two) times daily.), Disp: 30 capsule, Rfl: 5   loratadine (CLARITIN) 10 MG tablet, Take 10 mg by mouth daily., Disp: , Rfl:    losartan (COZAAR) 50 MG tablet, Take 1 tablet (50 mg total) by mouth daily., Disp: 90 tablet, Rfl: 3   metoprolol succinate (TOPROL-XL) 50 MG 24 hr tablet, Take with or immediately following a meal., Disp: 30 tablet, Rfl: 3   OZEMPIC, 0.25 OR 0.5 MG/DOSE, 2 MG/3ML SOPN, Inject 0.5 mg into the skin once a week., Disp: , Rfl:    rosuvastatin (CRESTOR) 5 MG tablet, Take 1 tablet (5 mg total) by mouth daily., Disp: 30 tablet, Rfl: 3   tadalafil (CIALIS) 20 MG tablet, Take 20 mg by mouth daily as needed for erectile dysfunction., Disp: , Rfl:    tamsulosin (FLOMAX) 0.4 MG CAPS capsule, Take 0.4 mg by mouth in the morning and at bedtime. , Disp: , Rfl:   Observations/Objective: Patient is well-developed, well-nourished in no acute distress.  Resting comfortably  at home.  Head is normocephalic, atraumatic.  No labored breathing.  Speech is clear and coherent with logical content.  Patient is alert and oriented at baseline.  Cough and nasal tone noted  Assessment and Plan:  1. Environmental and seasonal allergies  - fluticasone (FLONASE) 50 MCG/ACT nasal spray; Place 2 sprays into both nostrils daily.  Dispense: 16 g; Refill: 0 - benzonatate (TESSALON) 100 MG capsule; Take 2 capsules (200 mg total) by mouth 2 (two) times daily as needed for cough.  Dispense: 30 capsule; Refill:  0 - levocetirizine (XYZAL) 5 MG tablet; Take 1 tablet (5 mg total) by mouth every evening.  Dispense: 30 tablet; Refill: 0  2. Acute bacterial sinusitis  - doxycycline (VIBRA-TABS) 100 MG tablet; Take 1 tablet (100 mg total) by mouth 2 (two) times daily for 10 days.  Dispense: 20 tablet; Refill: 0 - benzonatate (TESSALON) 100 MG capsule; Take 2 capsules (200 mg total) by mouth 2 (two) times daily as needed for cough.  Dispense: 30 capsule; Refill: 0  -given duration and S&S will adjust allergy meds and treat for sinus infection  -Take meds as prescribed -Rest -Use a cool mist humidifier especially during the winter months when heat dries out the air. - Use saline nose sprays frequently to help soothe nasal passages and promote drainage. -Saline irrigations of the nose can be very helpful if done frequently.             * 4X daily for 1 week*             *  Use of a nettie pot can be helpful with this.  *Follow directions with this* *Boiled or distilled water only -stay hydrated by drinking plenty of fluids - Keep thermostat turn down low to prevent drying out sinuses - For any cough or congestion- robitussin DM or Delsym as needed - For fever or aches or pains- take tylenol or ibuprofen as directed on bottle             * for fevers greater than 101 orally you may alternate ibuprofen and tylenol every 3 hours.  If you do not improve you will need a follow up visit in person.                Reviewed side effects, risks and benefits of medication.    Patient acknowledged agreement and understanding of the plan.   Past Medical, Surgical, Social History, Allergies, and Medications have been Reviewed.    Follow Up Instructions: I discussed the assessment and treatment plan with the patient. The patient was provided an opportunity to ask questions and all were answered. The patient agreed with the plan and demonstrated an understanding of the instructions.  A copy of instructions were  sent to the patient via MyChart unless otherwise noted below.    The patient was advised to call back or seek an in-person evaluation if the symptoms worsen or if the condition fails to improve as anticipated.  Time:  I spent 10 minutes with the patient via telehealth technology discussing the above problems/concerns.    Freddy Finner, NP

## 2023-02-08 ENCOUNTER — Telehealth: Payer: Self-pay | Admitting: Cardiology

## 2023-02-08 ENCOUNTER — Other Ambulatory Visit: Payer: Self-pay | Admitting: Cardiology

## 2023-02-08 ENCOUNTER — Encounter: Payer: Self-pay | Admitting: Cardiology

## 2023-02-08 NOTE — Telephone Encounter (Signed)
Patient c/o Palpitations:  High priority if patient c/o lightheadedness, shortness of breath, or chest pain  How long have you had palpitations/irregular HR/ Afib? Are you having the symptoms now? Since yesterday 07/02 and all day today.   Are you currently experiencing lightheadedness, SOB or CP? No   Do you have a history of afib (atrial fibrillation) or irregular heart rhythm? Yes   Have you checked your BP or HR? (document readings if available):  HR 96  Are you experiencing any other symptoms? No

## 2023-02-08 NOTE — Telephone Encounter (Signed)
I advised patient to go to the ER for evaluation, he refuses. He said Dr.Branch  told him he just had to call and he would send him in medication.  It is after 5 pm and he needs an EKG, he said he is not going to go to the ER    I will FYI Dr.Branch

## 2023-02-13 ENCOUNTER — Other Ambulatory Visit: Payer: Self-pay

## 2023-02-13 MED ORDER — METOPROLOL SUCCINATE ER 100 MG PO TB24: 100.0000 mg | ORAL_TABLET | Freq: Every day | ORAL | 3 refills | Status: DC

## 2023-02-13 NOTE — Telephone Encounter (Signed)
Please increase toprol to 100mg  daily and update [place] on symptoms if reoccuring. If ongoing may need to arrange home monitor  J Sheala Dosh MD

## 2023-02-22 ENCOUNTER — Encounter: Payer: Self-pay | Admitting: Cardiology

## 2023-02-22 ENCOUNTER — Ambulatory Visit: Payer: Managed Care, Other (non HMO) | Attending: Cardiology | Admitting: Cardiology

## 2023-02-22 VITALS — BP 126/80 | HR 91 | Ht 72.0 in | Wt 318.0 lb

## 2023-02-22 DIAGNOSIS — E782 Mixed hyperlipidemia: Secondary | ICD-10-CM

## 2023-02-22 DIAGNOSIS — I10 Essential (primary) hypertension: Secondary | ICD-10-CM

## 2023-02-22 DIAGNOSIS — I4892 Unspecified atrial flutter: Secondary | ICD-10-CM

## 2023-02-22 NOTE — Progress Notes (Signed)
Clinical Summary Patrick Chapman is a 62 y.o.male seen today for follow up of the following medical problems.     1. Aflutter - new diagnosis during admission with sepsis/bowel perforation in 07/2020 -  status post TEE/DCCV 07/23/2020. Plan was for short course of amiodarone   -he has history of chronic mild palpitations for several years which are ongoing. No severe symptoms, heart rate checks at home have been normal - remains on eliquis at this time.    - arrythmia occurred in setting of severe systemic illness, in absence of clinical recurrence and low CHADS2Vasc score of 1 we stopped eliquis      - recent palpitations, we increased his toprol to 100mg  daily. Has taken higher dose just the last few days - some palpitations at times, varying in frequency.      2. History of Systolic dysfunction - 07/15/20 echo LVEF 60-65% - 07/23/20 TEE LVEF 55-60% by offical report, the prelim report mentioned 35-40%. This was in the setting of uncontrolled aflutter - 10/2020 TTE LVEF 60-65%, no WMAs, normal RV function   - no recent symptoms       3. Weight loss - significant weight loss with ozempic     4. HTN - he remains compliant with meds   5. Hyperlipidemia - 06/2022 TC 206 TG 204 HDL 46 LDL 124  01/2023 TC 098 TG 119 HDL 33 LDL 56 - compliant with meds Past Medical History:  Diagnosis Date   Atypical atrial flutter (HCC)    GERD (gastroesophageal reflux disease)    Gout    Hypertension    Obesity    Sleep apnea    was tested, but had lap band surgery and with weight loss, no problems     Allergies  Allergen Reactions   Codeine Nausea And Vomiting and Rash   Augmentin [Amoxicillin-Pot Clavulanate] Rash     Current Outpatient Medications  Medication Sig Dispense Refill   amLODipine (NORVASC) 10 MG tablet Take 1 tablet (10 mg total) by mouth daily. 90 tablet 3   metoprolol succinate (TOPROL-XL) 100 MG 24 hr tablet Take 1 tablet (100 mg total) by mouth daily.  Take with or immediately following a meal. 90 tablet 3   acetaminophen (TYLENOL) 500 MG tablet Take 1,000 mg by mouth every 6 (six) hours as needed for moderate pain or headache.     albuterol (VENTOLIN HFA) 108 (90 Base) MCG/ACT inhaler Inhale 2 puffs into the lungs every 6 (six) hours as needed for wheezing or shortness of breath. 8 g 0   ascorbic acid (VITAMIN C) 500 MG tablet Take 500 mg by mouth daily.     benzonatate (TESSALON) 100 MG capsule Take 2 capsules (200 mg total) by mouth 2 (two) times daily as needed for cough. 30 capsule 0   colchicine 0.6 MG tablet Take 0.6 mg by mouth daily. Prn gout flare     dexlansoprazole (DEXILANT) 60 MG capsule Take 1 capsule (60 mg total) by mouth daily. (Patient taking differently: Take 60 mg by mouth 2 (two) times daily.) 30 capsule 5   fluticasone (FLONASE) 50 MCG/ACT nasal spray Place 2 sprays into both nostrils daily. 16 g 0   levocetirizine (XYZAL) 5 MG tablet Take 1 tablet (5 mg total) by mouth every evening. 30 tablet 0   losartan (COZAAR) 50 MG tablet Take 1 tablet (50 mg total) by mouth daily. 90 tablet 3   OZEMPIC, 0.25 OR 0.5 MG/DOSE, 2 MG/3ML SOPN Inject 0.5  mg into the skin once a week.     rosuvastatin (CRESTOR) 5 MG tablet Take 1 tablet (5 mg total) by mouth daily. 30 tablet 3   tadalafil (CIALIS) 20 MG tablet Take 20 mg by mouth daily as needed for erectile dysfunction.     tamsulosin (FLOMAX) 0.4 MG CAPS capsule Take 0.4 mg by mouth in the morning and at bedtime.      No current facility-administered medications for this visit.     Past Surgical History:  Procedure Laterality Date   ANTERIOR CERVICAL DECOMP/DISCECTOMY FUSION N/A 08/15/2014   Procedure: ANTERIOR CERVICAL DECOMPRESSION/DISCECTOMY FUSION 1 LEVEL;  Surgeon: Lisbeth Renshaw, MD;  Location: [facility];  Service: Neurosurgery;  Laterality: N/A;  C56 anterior cervical fusion with interbody prosthesis plating and bonegraft C 5-6   APPLICATION OF WOUND VAC N/A 07/14/2020    Procedure: APPLICATION OF WOUND VAC;  Surgeon: Emelia Loron, MD;  Location: [facility] OR;  Service: General;  Laterality: N/A;   BIOPSY  10/10/2019   Procedure: BIOPSY;  Surgeon: Corbin Ade, MD;  Location: [facility];  Service: Endoscopy;;   CARDIOVERSION N/A 07/23/2020   Procedure: CARDIOVERSION;  Surgeon: Vesta Mixer, MD;  Location: [facility] ENDOSCOPY;  Service: Cardiovascular;  Laterality: N/A;   CHOLECYSTECTOMY     COLONOSCOPY WITH ESOPHAGOGASTRODUODENOSCOPY (EGD)  06/12/2012   Dr. Jena Gauss: Status post prior lap band placement, otherwise normal study.  2 tubular adenomas removed from the colon.  Next colonoscopy in 5 years.   COLONOSCOPY WITH PROPOFOL N/A 10/10/2019   Procedure: COLONOSCOPY WITH PROPOFOL;  Surgeon: Corbin Ade, MD;  Location: [facility];  Service: Endoscopy;  Laterality: N/A;  11:00am   LAPAROSCOPIC GASTRIC BANDING  2009   Dr. Daphine Deutscher   LAPAROTOMY N/A 07/14/2020   Procedure: EXPLORATORY LAPAROTOMY WITH REMOVAL OF LAP BAND AND COMPONENTS;  Surgeon: Emelia Loron, MD;  Location: [facility] OR;  Service: General;  Laterality: N/A;   TEE WITHOUT CARDIOVERSION N/A 07/23/2020   Procedure: TRANSESOPHAGEAL ECHOCARDIOGRAM (TEE);  Surgeon: Elease Hashimoto Deloris Ping, MD;  Location: [facility] ENDOSCOPY;  Service: Cardiovascular;  Laterality: N/A;     Allergies  Allergen Reactions   Codeine Nausea And Vomiting and Rash   Augmentin [Amoxicillin-Pot Clavulanate] Rash      Family History  Problem Relation Age of Onset   Heart attack Mother        living   Heart attack Father        living   Diabetes Father    Colon cancer Neg Hx      Social History Patrick Chapman reports that he quit smoking about 35 years ago. His smoking use included cigarettes. He started smoking about 45 years ago. He has a 10 pack-year smoking history. His smokeless tobacco use includes snuff. Patrick Chapman reports that he does not currently use alcohol.   Review of Systems CONSTITUTIONAL: No weight loss, fever,  chills, weakness or fatigue.  HEENT: Eyes: No visual loss, blurred vision, double vision or yellow sclerae.No hearing loss, sneezing, congestion, runny nose or sore throat.  SKIN: No rash or itching.  CARDIOVASCULAR: per hpi RESPIRATORY: No shortness of breath, cough or sputum.  GASTROINTESTINAL: No anorexia, nausea, vomiting or diarrhea. No abdominal pain or blood.  GENITOURINARY: No burning on urination, no polyuria NEUROLOGICAL: No headache, dizziness, syncope, paralysis, ataxia, numbness or tingling in the extremities. No change in bowel or bladder control.  MUSCULOSKELETAL: No muscle, back pain, joint pain or stiffness.  LYMPHATICS: No enlarged nodes. No history of splenectomy.  PSYCHIATRIC: No history of depression or anxiety.  ENDOCRINOLOGIC: No reports of sweating, cold or heat intolerance. No polyuria or polydipsia.  Marland Kitchen   Physical Examination Today's Vitals   02/22/23 1522  BP: 126/80  Pulse: 91  SpO2: 93%  Weight: (!) 318 lb (144.2 kg)  Height: 6' (1.829 m)   Body mass index is 43.13 kg/m.  Gen: resting comfortably, no acute distress HEENT: no scleral icterus, pupils equal round and reactive, no palptable cervical adenopathy,  CV: RRR, no m/rg, no jvd Resp: Clear to auscultation bilaterally GI: abdomen is soft, non-tender, non-distended, normal bowel sounds, no hepatosplenomegaly MSK: extremities are warm, no edema.  Skin: warm, no rash Neuro:  no focal deficits Psych: appropriate affect   Diagnostic Studies     Assessment and Plan   1. Aflutter - new diagnosis during admission in 07/2020 with sepsis, abdominal perforation - s/p TEE/DCCV, transient course of amiodarone.  - appears this was transient in the setting of severe systemic stress, has not had signs of recurrence. Without recurrence and CHADS2Vasc score of 1 he is off eliquls  - recent palpitations short in duration and similar to his several year history, I suspect likely some ectopy. Follow with  higher dose of toprol 100mg  daily, if ongoing could repeat monitor.     2. Hyperlipidemia -he is at goal, continue curren tmeds   3. HTN -he is at goal, conitnue current meds  F/u 6 months     Antoine Poche, M.D.

## 2023-02-22 NOTE — Patient Instructions (Signed)

## 2023-09-12 ENCOUNTER — Encounter: Payer: Self-pay | Admitting: Internal Medicine

## 2023-10-03 ENCOUNTER — Other Ambulatory Visit: Payer: Self-pay | Admitting: Cardiology

## 2023-11-06 ENCOUNTER — Other Ambulatory Visit: Payer: Self-pay | Admitting: Cardiology

## 2023-11-20 ENCOUNTER — Other Ambulatory Visit: Payer: Self-pay | Admitting: Cardiology

## 2023-12-19 ENCOUNTER — Other Ambulatory Visit: Payer: Self-pay | Admitting: Cardiology

## 2024-02-21 ENCOUNTER — Other Ambulatory Visit: Payer: Self-pay | Admitting: Cardiology

## 2024-03-06 ENCOUNTER — Ambulatory Visit: Payer: Self-pay | Attending: Cardiology | Admitting: Cardiology

## 2024-03-06 ENCOUNTER — Encounter: Payer: Self-pay | Admitting: Cardiology

## 2024-03-06 VITALS — BP 136/82 | HR 75 | Ht 72.0 in | Wt 349.0 lb

## 2024-03-06 DIAGNOSIS — I10 Essential (primary) hypertension: Secondary | ICD-10-CM

## 2024-03-06 DIAGNOSIS — I4892 Unspecified atrial flutter: Secondary | ICD-10-CM

## 2024-03-06 DIAGNOSIS — E782 Mixed hyperlipidemia: Secondary | ICD-10-CM | POA: Diagnosis not present

## 2024-03-06 NOTE — Progress Notes (Signed)
 Clinical Summary Patrick Chapman is a 63 y.o.male seen today for follow up of the following medical problems.   1. Aflutter - new diagnosis during admission with sepsis/bowel perforation in 07/2020 -  status post TEE/DCCV 07/23/2020. Plan was for short course of amiodarone    - arrythmia occurred in setting of severe systemic illness, in absence of clinical recurrence and low CHADS2Vasc score of 1 we stopped eliquis     -denies significant symptoms - compliant with meds     2. History of Systolic dysfunction - 07/15/20 echo LVEF 60-65% - 07/23/20 TEE LVEF 55-60% by offical report, the prelim report mentioned 35-40%. This was in the setting of uncontrolled aflutter - 10/2020 TTE LVEF 60-65%, no WMAs, normal RV function   - no recent symptoms       3. HTN - compliant with meds   5. 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  Past Medical History:  Diagnosis Date   Atypical atrial flutter (HCC)    GERD (gastroesophageal reflux disease)    Gout    Hypertension    Obesity    Sleep apnea    was tested, but had lap band surgery and with weight loss, no problems     Allergies  Allergen Reactions   Codeine Nausea And Vomiting and Rash   Augmentin  [Amoxicillin -Pot Clavulanate] Rash     Current Outpatient Medications  Medication Sig Dispense Refill   amLODipine  (NORVASC ) 10 MG tablet TAKE ONE TABLET (10MG  TOTAL) BY MOUTH DAILY 30 tablet 2   ascorbic acid (VITAMIN C) 500 MG tablet Take 500 mg by mouth daily.     colchicine 0.6 MG tablet Take 0.6 mg by mouth daily. Prn gout flare     dexlansoprazole  (DEXILANT ) 60 MG capsule Take 1 capsule (60 mg total) by mouth daily. (Patient taking differently: Take 60 mg by mouth 2 (two) times daily.) 30 capsule 5   levocetirizine (XYZAL ) 5 MG tablet Take 1 tablet (5 mg total) by mouth every evening. 30 tablet 0   losartan  (COZAAR ) 50 MG  tablet Take 1 tablet (50 mg total) by mouth daily. 90 tablet 3   metoprolol  succinate (TOPROL -XL) 100 MG 24 hr tablet TAKE ONE TABLET (100MG  TOTAL) BY MOUTH DAILY. TAKE WITH OR IMMEDIATELY FOLLOWING A MEAL. 90 tablet 0   OZEMPIC, 0.25 OR 0.5 MG/DOSE, 2 MG/3ML SOPN Inject 1 mg into the skin once a week.     rosuvastatin  (CRESTOR ) 5 MG tablet Take 1 tablet (5 mg total) by mouth daily. 30 tablet 3   tamsulosin  (FLOMAX ) 0.4 MG CAPS capsule Take 0.4 mg by mouth in the morning and at bedtime.      No current facility-administered medications for this visit.     Past Surgical History:  Procedure Laterality Date   ANTERIOR CERVICAL DECOMP/DISCECTOMY FUSION N/A 08/15/2014   Procedure: ANTERIOR CERVICAL DECOMPRESSION/DISCECTOMY FUSION 1 LEVEL;  Surgeon: Gerldine Maizes, MD;  Location: [facility];  Service: Neurosurgery;  Laterality: N/A;  C56 anterior cervical fusion with interbody prosthesis plating and bonegraft C 5-6   APPLICATION OF WOUND VAC N/A 07/14/2020   Procedure: APPLICATION OF WOUND VAC;  Surgeon: Ebbie Cough, MD;  Location: [facility] OR;  Service: General;  Laterality: N/A;   BIOPSY  10/10/2019   Procedure: BIOPSY;  Surgeon: Shaaron Lamar HERO, MD;  Location: [facility];  Service: Endoscopy;;  CARDIOVERSION N/A 07/23/2020   Procedure: CARDIOVERSION;  Surgeon: Nahser, Aleene PARAS, MD;  Location: [facility] ENDOSCOPY;  Service: Cardiovascular;  Laterality: N/A;   CHOLECYSTECTOMY     COLONOSCOPY WITH ESOPHAGOGASTRODUODENOSCOPY (EGD)  06/12/2012   Dr. Shaaron: Status post prior lap band placement, otherwise normal study.  2 tubular adenomas removed from the colon.  Next colonoscopy in 5 years.   COLONOSCOPY WITH PROPOFOL  N/A 10/10/2019   Procedure: COLONOSCOPY WITH PROPOFOL ;  Surgeon: Shaaron Lamar HERO, MD;  Location: [facility];  Service: Endoscopy;  Laterality: N/A;  11:00am   LAPAROSCOPIC GASTRIC BANDING  2009   Dr. Gladis   LAPAROTOMY N/A 07/14/2020   Procedure: EXPLORATORY LAPAROTOMY WITH REMOVAL OF  LAP BAND AND COMPONENTS;  Surgeon: Ebbie Cough, MD;  Location: [facility] OR;  Service: General;  Laterality: N/A;   TEE WITHOUT CARDIOVERSION N/A 07/23/2020   Procedure: TRANSESOPHAGEAL ECHOCARDIOGRAM (TEE);  Surgeon: Alveta Aleene PARAS, MD;  Location: [facility] ENDOSCOPY;  Service: Cardiovascular;  Laterality: N/A;     Allergies  Allergen Reactions   Codeine Nausea And Vomiting and Rash   Augmentin  [Amoxicillin -Pot Clavulanate] Rash      Family History  Problem Relation Age of Onset   Heart attack Mother        living   Heart attack Father        living   Diabetes Father    Colon cancer Neg Hx      Social History Mr. Garriga reports that he quit smoking about 36 years ago. His smoking use included cigarettes. He started smoking about 46 years ago. He has a 10 pack-year smoking history. His smokeless tobacco use includes snuff. Mr. Broady reports that he does not currently use alcohol.    Physical Examination Today's Vitals   03/06/24 1005  BP: 136/82  Pulse: 75  SpO2: 94%  Weight: (!) 349 lb (158.3 kg)  Height: 6' (1.829 m)   Body mass index is 47.33 kg/m.  Gen: resting comfortably, no acute distress HEENT: no scleral icterus, pupils equal round and reactive, no palptable cervical adenopathy,  CV: RRR, no m/rg, no jvd Resp: Clear to auscultation bilaterally GI: abdomen is soft, non-tender, non-distended, normal bowel sounds, no hepatosplenomegaly MSK: extremities are warm, no edema.  Skin: warm, no rash Neuro:  no focal deficits Psych: appropriate affect    Assessment and Plan  1. Aflutter - new diagnosis during admission in 07/2020 with sepsis, abdominal perforation - s/p TEE/DCCV, transient course of amiodarone .  - appears this was transient in the setting of severe systemic stress, has not had signs of recurrence. Without recurrence and CHADS2Vasc score of 1 he is off eliquls   - no recent significant symptoms - EKG today shows NSR - continue to monitor.      2. Hyperlipidemia -LDL at goal, working on dietary changes and weight loss to lower TGs   3. HTN -at goal, continue current meds   F/u 1 year     Dorn PHEBE Ross, M.D.

## 2024-03-06 NOTE — Patient Instructions (Signed)

## 2024-03-20 ENCOUNTER — Other Ambulatory Visit: Payer: Self-pay | Admitting: Cardiology

## 2024-05-21 ENCOUNTER — Other Ambulatory Visit: Payer: Self-pay | Admitting: Cardiology

## 2024-07-19 ENCOUNTER — Encounter: Payer: Self-pay | Admitting: Cardiology

## 2024-08-05 NOTE — Telephone Encounter (Signed)
 Patient called again to check on status of his call earlier today, and his mychart message, that he never got a reply back from.

## 2024-08-05 NOTE — Telephone Encounter (Signed)
 PT is calling stating that him and Dr Alvan had a conversation about prescribing him medication for his heart palpitations. Calling for update.

## 2024-08-13 ENCOUNTER — Telehealth: Payer: Self-pay | Admitting: *Deleted

## 2024-08-13 MED ORDER — AMLODIPINE BESYLATE 10 MG PO TABS: 10.0000 mg | ORAL_TABLET | Freq: Every evening | ORAL | Status: AC

## 2024-08-13 MED ORDER — LOSARTAN POTASSIUM 50 MG PO TABS: 50.0000 mg | ORAL_TABLET | Freq: Every morning | ORAL | Status: AC

## 2024-08-13 MED ORDER — METOPROLOL SUCCINATE ER 100 MG PO TB24: 100.0000 mg | ORAL_TABLET | Freq: Every morning | ORAL | Status: DC

## 2024-08-13 NOTE — Telephone Encounter (Signed)
 Contacted patient as he never read his most recent message.  States that when he last saw Dr. Alvan - they discussed getting something to use on as needed basis for his palpitations.  States they are some better, but states he feels like he has been on a roller coaster for 20 years.  Some days he doesn't have them at all & there are other times that he has them several times a day.  Having them at bedtime is particularly bothersome.  He is still taking the Toprol  XL 100mg  every morning - medications reviewed & updated.  No c/o any chest pain, dizziness or sob.  BP running 130's / 70-80's.  HR running 70-80's.

## 2024-08-13 NOTE — Telephone Encounter (Signed)
 This message is to inform you that the patient has not yet read the following message. (Notification date: August 12, 2024)    Palpitation Meds.  From [name], LPN To Patrick Chapman, [name] and Delivered 08/06/2024 10:05 AM      Per Dr. Alvan -    Can he update us  how often the palpitaitons are happening. If frequently then would could consider a daily medication change, if just occasionally sometimes will use a medication just as needed. Can he clarify he is still taking the toprol .     Please advise -    Thanks,    Sharman DEL, LPN            Previous Messages  ----- Message ----- From: CMA Rosina NOVAK Sent: 08/05/2024  5:12 PM EST To:   [name] Metta evening [name]. Dr. Alvan is not currently in the office this week, however he is checking his inbox for messages. We have sent your message to him regarding medication. As soon as he responds, we will give you a call or send you a mychart message.    Have a great night!   ----- Message ----- From: [name] Sent: 07/24/2024  8:55 AM EST To:   Patient Medical Advice Request Message List   Is this request for something to curb the palpitations out of line? I really thought Dr Alvan said to let him know if I wanted to take something to help with this.   ----- Message ----- From: [name] Sent: 07/19/2024  5:25 PM EST To:   Patient Medical Advice Request Message List   No, no other symptoms. I have had these all my life and they are just irritating at times. Ive just been avoiding taking another medication, but am ready to give in to stop it from happening so often.   ----- Message ----- From: Nurse Jon DEL Sent: 07/19/2024  5:21 PM EST To:   [name]   Are you having any symptoms like chest pain, dizziness, fast heart rate or shortness of breath.   How has your blood pressure & heart rate been at home ?    Thanks,    Sharman DEL, LPN        ----- Message ----- From: [name] Sent: 07/19/2024  5:12 PM EST To:   [name], MD   Dr Alvan, I believe you told me you could give me something for these palpitations to regulate them. If so I would like to get something for them.     Audit Geophysicist/field Seismologist Last Read On  [name] Not Read

## 2024-08-13 NOTE — Telephone Encounter (Signed)
 I think best option would be to increase his daily topropl I would continue to 100mg  in AM and try adding 50mg  in the PM and update us  on symptoms 1 week  JINNY Ross MD

## 2024-08-14 ENCOUNTER — Other Ambulatory Visit: Payer: Self-pay | Admitting: *Deleted

## 2024-08-14 MED ORDER — METOPROLOL SUCCINATE ER 100 MG PO TB24: ORAL_TABLET | ORAL | 6 refills | Status: AC

## 2024-08-14 NOTE — Telephone Encounter (Signed)
 Replied via mychart.
